# Patient Record
Sex: Female | Born: 1970 | Race: White | Hispanic: No | Marital: Married | Smoking: Never smoker
Health system: Southern US, Community
[De-identification: ages and names within clinical notes are randomized; demographics above are authoritative.]

## PROBLEM LIST (undated history)

## (undated) DIAGNOSIS — F419 Anxiety disorder, unspecified: Secondary | ICD-10-CM

## (undated) DIAGNOSIS — R51 Headache: Secondary | ICD-10-CM

## (undated) DIAGNOSIS — I10 Essential (primary) hypertension: Secondary | ICD-10-CM

## (undated) DIAGNOSIS — R519 Headache, unspecified: Secondary | ICD-10-CM

## (undated) DIAGNOSIS — J189 Pneumonia, unspecified organism: Secondary | ICD-10-CM

## (undated) DIAGNOSIS — K219 Gastro-esophageal reflux disease without esophagitis: Secondary | ICD-10-CM

## (undated) HISTORY — PX: TONSILLECTOMY: SUR1361

---

## 2000-05-06 ENCOUNTER — Other Ambulatory Visit: Admission: RE | Admit: 2000-05-06 | Discharge: 2000-05-06 | Payer: Self-pay | Admitting: Obstetrics and Gynecology

## 2000-06-05 ENCOUNTER — Encounter: Admission: RE | Admit: 2000-06-05 | Discharge: 2000-06-05 | Payer: Self-pay | Admitting: Orthopedic Surgery

## 2000-06-05 ENCOUNTER — Encounter: Payer: Self-pay | Admitting: Orthopedic Surgery

## 2001-05-27 ENCOUNTER — Other Ambulatory Visit: Admission: RE | Admit: 2001-05-27 | Discharge: 2001-05-27 | Payer: Self-pay | Admitting: Obstetrics and Gynecology

## 2002-06-16 ENCOUNTER — Other Ambulatory Visit: Admission: RE | Admit: 2002-06-16 | Discharge: 2002-06-16 | Payer: Self-pay | Admitting: Obstetrics & Gynecology

## 2003-07-23 ENCOUNTER — Other Ambulatory Visit: Admission: RE | Admit: 2003-07-23 | Discharge: 2003-07-23 | Payer: Self-pay | Admitting: Obstetrics and Gynecology

## 2004-07-06 ENCOUNTER — Ambulatory Visit: Payer: Self-pay | Admitting: Internal Medicine

## 2004-07-14 ENCOUNTER — Ambulatory Visit: Payer: Self-pay | Admitting: Internal Medicine

## 2004-08-11 ENCOUNTER — Other Ambulatory Visit: Admission: RE | Admit: 2004-08-11 | Discharge: 2004-08-11 | Payer: Self-pay | Admitting: Obstetrics and Gynecology

## 2014-10-22 ENCOUNTER — Other Ambulatory Visit: Payer: Self-pay | Admitting: Family Medicine

## 2014-10-22 ENCOUNTER — Ambulatory Visit
Admission: RE | Admit: 2014-10-22 | Discharge: 2014-10-22 | Disposition: A | Discharge status: Home / Self Care | Payer: BLUE CROSS/BLUE SHIELD | Source: Ambulatory Visit | Attending: Family Medicine | Admitting: Family Medicine

## 2014-10-22 DIAGNOSIS — R52 Pain, unspecified: Secondary | ICD-10-CM

## 2015-11-11 ENCOUNTER — Other Ambulatory Visit: Payer: Self-pay | Admitting: Family Medicine

## 2015-11-11 DIAGNOSIS — R51 Headache: Principal | ICD-10-CM

## 2015-11-11 DIAGNOSIS — R519 Headache, unspecified: Secondary | ICD-10-CM

## 2015-11-18 ENCOUNTER — Ambulatory Visit
Admission: RE | Admit: 2015-11-18 | Discharge: 2015-11-18 | Disposition: A | Discharge status: Home / Self Care | Payer: BLUE CROSS/BLUE SHIELD | Source: Ambulatory Visit | Attending: Family Medicine | Admitting: Family Medicine

## 2015-11-18 DIAGNOSIS — R51 Headache: Principal | ICD-10-CM

## 2015-11-18 DIAGNOSIS — R519 Headache, unspecified: Secondary | ICD-10-CM

## 2015-11-18 MED ORDER — GADOBENATE DIMEGLUMINE 529 MG/ML IV SOLN
13.0000 mL | Freq: Once | INTRAVENOUS | Status: AC | PRN
  Administered 2015-11-18: 13 mL via INTRAVENOUS

## 2017-08-15 ENCOUNTER — Ambulatory Visit
Admission: RE | Admit: 2017-08-15 | Discharge: 2017-08-15 | Disposition: A | Discharge status: Home / Self Care | Payer: BLUE CROSS/BLUE SHIELD | Source: Ambulatory Visit | Attending: Family Medicine | Admitting: Family Medicine

## 2017-08-15 ENCOUNTER — Other Ambulatory Visit: Payer: Self-pay | Admitting: Family Medicine

## 2017-08-15 DIAGNOSIS — R05 Cough: Secondary | ICD-10-CM

## 2017-08-15 DIAGNOSIS — R059 Cough, unspecified: Secondary | ICD-10-CM

## 2018-07-03 ENCOUNTER — Other Ambulatory Visit: Payer: Self-pay | Admitting: Family Medicine

## 2018-07-03 DIAGNOSIS — M25552 Pain in left hip: Secondary | ICD-10-CM

## 2018-07-04 ENCOUNTER — Ambulatory Visit
Admission: RE | Admit: 2018-07-04 | Discharge: 2018-07-04 | Disposition: A | Discharge status: Home / Self Care | Payer: PRIVATE HEALTH INSURANCE | Source: Ambulatory Visit | Attending: Family Medicine | Admitting: Family Medicine

## 2018-07-04 ENCOUNTER — Other Ambulatory Visit: Payer: Self-pay | Admitting: Family Medicine

## 2018-07-04 DIAGNOSIS — M25552 Pain in left hip: Secondary | ICD-10-CM

## 2018-07-08 ENCOUNTER — Other Ambulatory Visit: Payer: Self-pay | Admitting: Family Medicine

## 2018-07-08 DIAGNOSIS — M25552 Pain in left hip: Secondary | ICD-10-CM

## 2018-07-18 ENCOUNTER — Other Ambulatory Visit: Payer: Self-pay | Admitting: Family Medicine

## 2018-07-18 ENCOUNTER — Encounter: Payer: Self-pay | Admitting: Radiology

## 2018-07-18 ENCOUNTER — Ambulatory Visit
Admission: RE | Admit: 2018-07-18 | Discharge: 2018-07-18 | Disposition: A | Discharge status: Home / Self Care | Payer: PRIVATE HEALTH INSURANCE | Source: Ambulatory Visit | Attending: Family Medicine | Admitting: Family Medicine

## 2018-07-18 DIAGNOSIS — M25552 Pain in left hip: Secondary | ICD-10-CM

## 2018-07-18 MED ORDER — IOPAMIDOL (ISOVUE-M 200) INJECTION 41%
1.0000 mL | Freq: Once | INTRAMUSCULAR | Status: AC
  Administered 2018-07-18: 1 mL

## 2018-07-18 MED ORDER — METHYLPREDNISOLONE ACETATE 40 MG/ML INJ SUSP (RADIOLOG
120.0000 mg | Freq: Once | INTRAMUSCULAR | Status: AC
  Administered 2018-07-18: 120 mg via INTRA_ARTICULAR

## 2018-08-16 ENCOUNTER — Encounter: Payer: Self-pay | Admitting: Physical Medicine and Rehabilitation

## 2018-08-20 ENCOUNTER — Other Ambulatory Visit: Payer: Self-pay | Admitting: Orthopedic Surgery

## 2018-08-21 ENCOUNTER — Encounter (INDEPENDENT_AMBULATORY_CARE_PROVIDER_SITE_OTHER): Payer: Self-pay

## 2018-08-21 ENCOUNTER — Encounter (HOSPITAL_COMMUNITY)
Admission: RE | Admit: 2018-08-21 | Discharge: 2018-08-21 | Disposition: A | Discharge status: Home / Self Care | Payer: PRIVATE HEALTH INSURANCE | Source: Ambulatory Visit | Attending: Orthopedic Surgery | Admitting: Orthopedic Surgery

## 2018-08-21 ENCOUNTER — Encounter (HOSPITAL_COMMUNITY): Payer: Self-pay

## 2018-08-21 ENCOUNTER — Ambulatory Visit (HOSPITAL_COMMUNITY)
Admission: RE | Admit: 2018-08-21 | Discharge: 2018-08-21 | Disposition: A | Discharge status: Home / Self Care | Payer: PRIVATE HEALTH INSURANCE | Source: Ambulatory Visit | Attending: Orthopedic Surgery | Admitting: Orthopedic Surgery

## 2018-08-21 ENCOUNTER — Other Ambulatory Visit: Payer: Self-pay

## 2018-08-21 DIAGNOSIS — Z01811 Encounter for preprocedural respiratory examination: Secondary | ICD-10-CM | POA: Insufficient documentation

## 2018-08-21 HISTORY — DX: Headache: R51

## 2018-08-21 HISTORY — DX: Anxiety disorder, unspecified: F41.9

## 2018-08-21 HISTORY — DX: Headache, unspecified: R51.9

## 2018-08-21 HISTORY — DX: Pneumonia, unspecified organism: J18.9

## 2018-08-21 HISTORY — DX: Gastro-esophageal reflux disease without esophagitis: K21.9

## 2018-08-21 LAB — CBC WITH DIFFERENTIAL/PLATELET
Abs Immature Granulocytes: 0.05 10*3/uL (ref 0.00–0.07)
BASOS PCT: 0 %
Basophils Absolute: 0 10*3/uL (ref 0.0–0.1)
Eosinophils Absolute: 0.1 10*3/uL (ref 0.0–0.5)
Eosinophils Relative: 1 %
HCT: 39.7 % (ref 36.0–46.0)
Hemoglobin: 12.4 g/dL (ref 12.0–15.0)
Immature Granulocytes: 1 %
Lymphocytes Relative: 25 %
Lymphs Abs: 2.1 10*3/uL (ref 0.7–4.0)
MCH: 32 pg (ref 26.0–34.0)
MCHC: 31.2 g/dL (ref 30.0–36.0)
MCV: 102.6 fL — ABNORMAL HIGH (ref 80.0–100.0)
MONO ABS: 0.7 10*3/uL (ref 0.1–1.0)
Monocytes Relative: 9 %
Neutro Abs: 5.6 10*3/uL (ref 1.7–7.7)
Neutrophils Relative %: 64 %
Platelets: 233 10*3/uL (ref 150–400)
RBC: 3.87 MIL/uL (ref 3.87–5.11)
RDW: 12.4 % (ref 11.5–15.5)
WBC: 8.5 10*3/uL (ref 4.0–10.5)
nRBC: 0 % (ref 0.0–0.2)

## 2018-08-21 LAB — URINALYSIS, ROUTINE W REFLEX MICROSCOPIC
Bilirubin Urine: NEGATIVE
Glucose, UA: NEGATIVE mg/dL
Hgb urine dipstick: NEGATIVE
Ketones, ur: NEGATIVE mg/dL
Leukocytes,Ua: NEGATIVE
NITRITE: NEGATIVE
Protein, ur: NEGATIVE mg/dL
Specific Gravity, Urine: 1.019 (ref 1.005–1.030)
pH: 5 (ref 5.0–8.0)

## 2018-08-21 LAB — PROTIME-INR
INR: 1 (ref 0.8–1.2)
Prothrombin Time: 12.9 seconds (ref 11.4–15.2)

## 2018-08-21 LAB — COMPREHENSIVE METABOLIC PANEL
ALT: 26 U/L (ref 0–44)
ANION GAP: 9 (ref 5–15)
AST: 24 U/L (ref 15–41)
Albumin: 4.4 g/dL (ref 3.5–5.0)
Alkaline Phosphatase: 124 U/L (ref 38–126)
BILIRUBIN TOTAL: 0.6 mg/dL (ref 0.3–1.2)
BUN: 18 mg/dL (ref 6–20)
CO2: 24 mmol/L (ref 22–32)
Calcium: 9.2 mg/dL (ref 8.9–10.3)
Chloride: 105 mmol/L (ref 98–111)
Creatinine, Ser: 0.72 mg/dL (ref 0.44–1.00)
GFR calc Af Amer: >60 mL/min (ref >60)
GFR calc non Af Amer: >60 mL/min (ref >60)
Glucose, Bld: 92 mg/dL (ref 70–99)
Potassium: 3.9 mmol/L (ref 3.5–5.1)
Sodium: 138 mmol/L (ref 135–145)
Total Protein: 7.5 g/dL (ref 6.5–8.1)

## 2018-08-21 LAB — APTT: aPTT: 27 seconds (ref 24–36)

## 2018-08-21 LAB — SURGICAL PCR SCREEN
MRSA, PCR: NEGATIVE
STAPHYLOCOCCUS AUREUS: NEGATIVE

## 2018-08-21 NOTE — Patient Instructions (Signed)
Patricia Mclean  08/21/2018   Your procedure is scheduled on: 08/22/2018   Report to [facility] Main  Entrance  Report to admitting at   1045 AM    Call this number if you have problems the morning of surgery [phone]   Remember: Do not eat food  :After Midnight. BRUSH YOUR TEETH MORNING OF SURGERY AND RINSE YOUR MOUTH OUT, NO CHEWING GUM CANDY OR MINTS.  May have clear liquids from until 0700am morning of surgery then nothing by mouth.     CLEAR LIQUID DIET   Foods Allowed                                                                     Foods Excluded  Coffee and tea, regular and decaf                             liquids that you cannot  Plain Jell-O in any flavor                                             see through such as: Fruit ices (not with fruit pulp)                                     milk, soups, orange juice  Iced Popsicles                                    All solid food Carbonated beverages, regular and diet                                    Cranberry, grape and apple juices Sports drinks like Gatorade Lightly seasoned clear broth or consume(fat free) Sugar, honey syrup  Sample Menu Breakfast                                Lunch                                     Supper Cranberry juice                    Beef broth                            Chicken broth Jell-O                                     Grape juice  Apple juice Coffee or tea                        Jell-O                                      Popsicle                                                Coffee or tea                        Coffee or tea  _____________________________________________________________________     Take these medicines the morning of surgery with A SIP OF WATER: none                                 You may not have any metal on your body including hair pins and              piercings  Do not wear  jewelry, make-up, lotions, powders or perfumes, deodorant             Do not wear nail polish.  Do not shave  48 hours prior to surgery.              Do not bring valuables to the hospital. Patricia Mclean IS NOT             RESPONSIBLE   FOR VALUABLES.  Contacts, dentures or bridgework may not be worn into surgery.  Leave suitcase in the car. After surgery it may be brought to your room.                      Please read over the following fact sheets you were given: _____________________________________________________________________             [facility] - Preparing for Surgery Before surgery, you can play an important role.  Because skin is not sterile, your skin needs to be as free of germs as possible.  You can reduce the number of germs on your skin by washing with CHG (chlorahexidine gluconate) soap before surgery.  CHG is an antiseptic cleaner which kills germs and bonds with the skin to continue killing germs even after washing. Please DO NOT use if you have an allergy to CHG or antibacterial soaps.  If your skin becomes reddened/irritated stop using the CHG and inform your nurse when you arrive at Short Stay. Do not shave (including legs and underarms) for at least 48 hours prior to the first CHG shower.  You may shave your face/neck. Please follow these instructions carefully:  1.  Shower with CHG Soap the night before surgery and the  morning of Surgery.  2.  If you choose to wash your hair, wash your hair first as usual with your  normal  shampoo.  3.  After you shampoo, rinse your hair and body thoroughly to remove the  shampoo.                           4.  Use CHG as you would any other liquid soap.  You can apply chg directly  to the skin and wash                       Gently with a scrungie or clean washcloth.  5.  Apply the CHG Soap to your body ONLY FROM THE NECK DOWN.   Do not use on face/ open                           Wound or open sores. Avoid contact with eyes,  ears mouth and genitals (private parts).                       Wash face,  Genitals (private parts) with your normal soap.             6.  Wash thoroughly, paying special attention to the area where your surgery  will be performed.  7.  Thoroughly rinse your body with warm water from the neck down.  8.  DO NOT shower/wash with your normal soap after using and rinsing off  the CHG Soap.                9.  Pat yourself dry with a clean towel.            10.  Wear clean pajamas.            11.  Place clean sheets on your bed the night of your first shower and do not  sleep with pets. Day of Surgery : Do not apply any lotions/deodorants the morning of surgery.  Please wear clean clothes to the hospital/surgery center.  FAILURE TO FOLLOW THESE INSTRUCTIONS MAY RESULT IN THE CANCELLATION OF YOUR SURGERY PATIENT SIGNATURE_________________________________  NURSE SIGNATURE__________________________________  ________________________________________________________________________   Patricia Mclean  An incentive spirometer is a tool that can help keep your lungs clear and active. This tool measures how well you are filling your lungs with each breath. Taking long deep breaths may help reverse or decrease the chance of developing breathing (pulmonary) problems (especially infection) following:  A long period of time when you are unable to move or be active. BEFORE THE PROCEDURE   If the spirometer includes an indicator to show your best effort, your nurse or respiratory therapist will set it to a desired goal.  If possible, sit up straight or lean slightly forward. Try not to slouch.  Hold the incentive spirometer in an upright position. INSTRUCTIONS FOR USE  1. Sit on the edge of your bed if possible, or sit up as far as you can in bed or on a chair. 2. Hold the incentive spirometer in an upright position. 3. Breathe out normally. 4. Place the mouthpiece in your mouth and seal your lips  tightly around it. 5. Breathe in slowly and as deeply as possible, raising the piston or the ball toward the top of the column. 6. Hold your breath for 3-5 seconds or for as long as possible. Allow the piston or ball to fall to the bottom of the column. 7. Remove the mouthpiece from your mouth and breathe out normally. 8. Rest for a few seconds and repeat Steps 1 through 7 at least 10 times every 1-2 hours when you are awake. Take your time and take a few normal breaths between deep breaths. 9. The spirometer may include an indicator to show your best effort. Use the indicator as a goal to work  toward during each repetition. 10. After each set of 10 deep breaths, practice coughing to be sure your lungs are clear. If you have an incision (the cut made at the time of surgery), support your incision when coughing by placing a pillow or rolled up towels firmly against it. Once you are able to get out of bed, walk around indoors and cough well. You may stop using the incentive spirometer when instructed by your caregiver.  RISKS AND COMPLICATIONS  Take your time so you do not get dizzy or light-headed.  If you are in pain, you may need to take or ask for pain medication before doing incentive spirometry. It is harder to take a deep breath if you are having pain. AFTER USE  Rest and breathe slowly and easily.  It can be helpful to keep track of a log of your progress. Your caregiver can provide you with a simple table to help with this. If you are using the spirometer at home, follow these instructions: [place] IF:   You are having difficultly using the spirometer.  You have trouble using the spirometer as often as instructed.  Your pain medication is not giving enough relief while using the spirometer.  You develop fever of 100.5 F (38.1 C) or higher. SEEK IMMEDIATE MEDICAL CARE IF:   You cough up bloody sputum that had not been present before.  You develop fever of 102 F  (38.9 C) or greater.  You develop worsening pain at or near the incision site. MAKE SURE YOU:   Understand these instructions.  Will watch your condition.  Will get help right away if you are not doing well or get worse. Document Released: 10/08/2006 Document Revised: 08/20/2011 Document Reviewed: 12/09/2006 [facility] Patient Information 2014 [place], [place].   ________________________________________________________________________

## 2018-08-22 ENCOUNTER — Telehealth (HOSPITAL_COMMUNITY): Payer: Self-pay | Admitting: *Deleted

## 2018-08-22 ENCOUNTER — Encounter (HOSPITAL_COMMUNITY): Payer: Self-pay | Admitting: *Deleted

## 2018-08-22 ENCOUNTER — Ambulatory Visit (HOSPITAL_COMMUNITY): Payer: No Typology Code available for payment source | Admitting: Anesthesiology

## 2018-08-22 ENCOUNTER — Ambulatory Visit (HOSPITAL_COMMUNITY): Payer: No Typology Code available for payment source

## 2018-08-22 ENCOUNTER — Observation Stay (HOSPITAL_COMMUNITY)
Admission: RE | Admit: 2018-08-22 | Discharge: 2018-08-24 | Disposition: A | Discharge status: Home Health | Payer: No Typology Code available for payment source | Attending: Orthopedic Surgery | Admitting: Orthopedic Surgery

## 2018-08-22 ENCOUNTER — Encounter (HOSPITAL_COMMUNITY)
Admission: RE | Disposition: A | Discharge status: Home Health | Payer: Self-pay | Source: Home / Self Care | Attending: Orthopedic Surgery

## 2018-08-22 DIAGNOSIS — M1612 Unilateral primary osteoarthritis, left hip: Secondary | ICD-10-CM | POA: Insufficient documentation

## 2018-08-22 DIAGNOSIS — F419 Anxiety disorder, unspecified: Secondary | ICD-10-CM | POA: Insufficient documentation

## 2018-08-22 DIAGNOSIS — Z7982 Long term (current) use of aspirin: Secondary | ICD-10-CM | POA: Diagnosis not present

## 2018-08-22 DIAGNOSIS — Z419 Encounter for procedure for purposes other than remedying health state, unspecified: Secondary | ICD-10-CM

## 2018-08-22 DIAGNOSIS — M879 Osteonecrosis, unspecified: Secondary | ICD-10-CM | POA: Diagnosis present

## 2018-08-22 DIAGNOSIS — M87052 Idiopathic aseptic necrosis of left femur: Secondary | ICD-10-CM | POA: Diagnosis present

## 2018-08-22 DIAGNOSIS — Z79899 Other long term (current) drug therapy: Secondary | ICD-10-CM | POA: Insufficient documentation

## 2018-08-22 DIAGNOSIS — K219 Gastro-esophageal reflux disease without esophagitis: Secondary | ICD-10-CM | POA: Diagnosis not present

## 2018-08-22 DIAGNOSIS — R51 Headache: Secondary | ICD-10-CM | POA: Diagnosis not present

## 2018-08-22 HISTORY — PX: TOTAL HIP ARTHROPLASTY: SHX124

## 2018-08-22 LAB — PREGNANCY, URINE: Preg Test, Ur: NEGATIVE

## 2018-08-22 LAB — ABO/RH: ABO/RH(D): A POS

## 2018-08-22 LAB — TYPE AND SCREEN
ABO/RH(D): A POS
Antibody Screen: NEGATIVE

## 2018-08-22 SURGERY — ARTHROPLASTY, HIP, TOTAL, ANTERIOR APPROACH
Anesthesia: Spinal | Site: Hip | Laterality: Left

## 2018-08-22 MED ORDER — PHENYLEPHRINE 40 MCG/ML (10ML) SYRINGE FOR IV PUSH (FOR BLOOD PRESSURE SUPPORT)
PREFILLED_SYRINGE | INTRAVENOUS | Status: DC | PRN
  Administered 2018-08-22: 40 ug via INTRAVENOUS
  Administered 2018-08-22: 80 ug via INTRAVENOUS
  Administered 2018-08-22: 40 ug via INTRAVENOUS
  Administered 2018-08-22 (×3): 80 ug via INTRAVENOUS

## 2018-08-22 MED ORDER — FENTANYL CITRATE (PF) 100 MCG/2ML IJ SOLN: 25.0000 ug | INTRAMUSCULAR | Status: DC | PRN

## 2018-08-22 MED ORDER — BISACODYL 5 MG PO TBEC: 5.0000 mg | DELAYED_RELEASE_TABLET | Freq: Every day | ORAL | Status: DC | PRN

## 2018-08-22 MED ORDER — ONDANSETRON HCL 4 MG/2ML IJ SOLN
INTRAMUSCULAR | Status: DC | PRN
  Administered 2018-08-22: 4 mg via INTRAVENOUS

## 2018-08-22 MED ORDER — ONDANSETRON HCL 4 MG/2ML IJ SOLN: 4.0000 mg | Freq: Four times a day (QID) | INTRAMUSCULAR | Status: DC | PRN

## 2018-08-22 MED ORDER — LACTATED RINGERS IV SOLN
INTRAVENOUS | Status: DC
  Administered 2018-08-22 (×2): via INTRAVENOUS

## 2018-08-22 MED ORDER — BUPIVACAINE LIPOSOME 1.3 % IJ SUSP
10.0000 mL | Freq: Once | INTRAMUSCULAR | Status: DC
  Filled 2018-08-22: qty 10

## 2018-08-22 MED ORDER — TIZANIDINE HCL 2 MG PO TABS: 2.0000 mg | ORAL_TABLET | Freq: Three times a day (TID) | ORAL | 0 refills | Status: DC | PRN

## 2018-08-22 MED ORDER — BUPIVACAINE LIPOSOME 1.3 % IJ SUSP
INTRAMUSCULAR | Status: DC | PRN
  Administered 2018-08-22: 20 mL

## 2018-08-22 MED ORDER — METOPROLOL TARTRATE 25 MG PO TABS
25.0000 mg | ORAL_TABLET | Freq: Two times a day (BID) | ORAL | Status: DC
  Administered 2018-08-22 – 2018-08-24 (×3): 25 mg via ORAL
  Filled 2018-08-22 (×4): qty 1

## 2018-08-22 MED ORDER — PROPOFOL 10 MG/ML IV BOLUS
INTRAVENOUS | Status: AC
  Filled 2018-08-22: qty 20

## 2018-08-22 MED ORDER — POLYETHYLENE GLYCOL 3350 17 G PO PACK: 17.0000 g | PACK | Freq: Every day | ORAL | Status: DC | PRN

## 2018-08-22 MED ORDER — WATER FOR IRRIGATION, STERILE IR SOLN
Status: DC | PRN
  Administered 2018-08-22: 1000 mL

## 2018-08-22 MED ORDER — SODIUM CHLORIDE 0.9 % IV SOLN
INTRAVENOUS | Status: DC
  Administered 2018-08-22: 20:00:00 via INTRAVENOUS

## 2018-08-22 MED ORDER — VANCOMYCIN HCL IN DEXTROSE 1-5 GM/200ML-% IV SOLN
1000.0000 mg | Freq: Once | INTRAVENOUS | Status: AC
  Administered 2018-08-22: 1000 mg via INTRAVENOUS
  Filled 2018-08-22: qty 200

## 2018-08-22 MED ORDER — ONDANSETRON HCL 4 MG/2ML IJ SOLN
INTRAMUSCULAR | Status: AC
  Filled 2018-08-22: qty 2

## 2018-08-22 MED ORDER — SODIUM CHLORIDE 0.9% FLUSH
INTRAVENOUS | Status: DC | PRN
  Administered 2018-08-22: 20 mL

## 2018-08-22 MED ORDER — OXYCODONE HCL 5 MG PO TABS
5.0000 mg | ORAL_TABLET | ORAL | Status: DC | PRN
  Administered 2018-08-22: 5 mg via ORAL
  Administered 2018-08-22 – 2018-08-23 (×6): 10 mg via ORAL
  Administered 2018-08-24: 5 mg via ORAL
  Administered 2018-08-24: 10 mg via ORAL
  Filled 2018-08-22 (×3): qty 2
  Filled 2018-08-22 (×2): qty 1
  Filled 2018-08-22 (×2): qty 2
  Filled 2018-08-22: qty 1
  Filled 2018-08-22 (×2): qty 2

## 2018-08-22 MED ORDER — VANCOMYCIN HCL 1000 MG IV SOLR
1000.0000 mg | Freq: Two times a day (BID) | INTRAVENOUS | Status: AC
  Administered 2018-08-22: 1000 mg via INTRAVENOUS
  Filled 2018-08-22: qty 1000

## 2018-08-22 MED ORDER — CEFAZOLIN SODIUM-DEXTROSE 2-4 GM/100ML-% IV SOLN: 2.0000 g | INTRAVENOUS | Status: DC

## 2018-08-22 MED ORDER — EPHEDRINE SULFATE-NACL 50-0.9 MG/10ML-% IV SOSY
PREFILLED_SYRINGE | INTRAVENOUS | Status: DC | PRN
  Administered 2018-08-22: 10 mg via INTRAVENOUS
  Administered 2018-08-22: 5 mg via INTRAVENOUS

## 2018-08-22 MED ORDER — METHOCARBAMOL 500 MG PO TABS
500.0000 mg | ORAL_TABLET | Freq: Four times a day (QID) | ORAL | Status: DC | PRN
  Administered 2018-08-22 – 2018-08-24 (×6): 500 mg via ORAL
  Filled 2018-08-22 (×6): qty 1

## 2018-08-22 MED ORDER — OXYCODONE HCL 5 MG/5ML PO SOLN: 5.0000 mg | Freq: Once | ORAL | Status: DC | PRN

## 2018-08-22 MED ORDER — TOPIRAMATE 25 MG PO TABS
50.0000 mg | ORAL_TABLET | Freq: Two times a day (BID) | ORAL | Status: DC
  Administered 2018-08-22 – 2018-08-24 (×4): 50 mg via ORAL
  Filled 2018-08-22 (×4): qty 2

## 2018-08-22 MED ORDER — TRANEXAMIC ACID-NACL 1000-0.7 MG/100ML-% IV SOLN
1000.0000 mg | Freq: Once | INTRAVENOUS | Status: AC
  Administered 2018-08-22: 1000 mg via INTRAVENOUS
  Filled 2018-08-22: qty 100

## 2018-08-22 MED ORDER — FENTANYL CITRATE (PF) 100 MCG/2ML IJ SOLN
INTRAMUSCULAR | Status: DC | PRN
  Administered 2018-08-22: 100 ug via INTRAVENOUS

## 2018-08-22 MED ORDER — OXYCODONE HCL 5 MG PO TABS: 5.0000 mg | ORAL_TABLET | Freq: Once | ORAL | Status: DC | PRN

## 2018-08-22 MED ORDER — OXYCODONE-ACETAMINOPHEN 5-325 MG PO TABS: 1.0000 | ORAL_TABLET | ORAL | 0 refills | Status: DC | PRN

## 2018-08-22 MED ORDER — ASPIRIN EC 325 MG PO TBEC
325.0000 mg | DELAYED_RELEASE_TABLET | Freq: Two times a day (BID) | ORAL | Status: DC
  Administered 2018-08-22 – 2018-08-24 (×4): 325 mg via ORAL
  Filled 2018-08-22 (×4): qty 1

## 2018-08-22 MED ORDER — SODIUM CHLORIDE 0.9 % IR SOLN
Status: DC | PRN
  Administered 2018-08-22: 1

## 2018-08-22 MED ORDER — TRANEXAMIC ACID-NACL 1000-0.7 MG/100ML-% IV SOLN
1000.0000 mg | INTRAVENOUS | Status: AC
  Administered 2018-08-22: 1000 mg via INTRAVENOUS
  Filled 2018-08-22: qty 100

## 2018-08-22 MED ORDER — PROPOFOL 10 MG/ML IV BOLUS
INTRAVENOUS | Status: DC | PRN
  Administered 2018-08-22: 40 mg via INTRAVENOUS
  Administered 2018-08-22: 20 mg via INTRAVENOUS
  Administered 2018-08-22: 50 mg via INTRAVENOUS
  Administered 2018-08-22 (×2): 20 mg via INTRAVENOUS

## 2018-08-22 MED ORDER — DEXAMETHASONE SODIUM PHOSPHATE 10 MG/ML IJ SOLN
10.0000 mg | Freq: Two times a day (BID) | INTRAMUSCULAR | Status: AC
  Administered 2018-08-22 – 2018-08-23 (×3): 10 mg via INTRAVENOUS
  Filled 2018-08-22 (×3): qty 1

## 2018-08-22 MED ORDER — PHENYLEPHRINE 40 MCG/ML (10ML) SYRINGE FOR IV PUSH (FOR BLOOD PRESSURE SUPPORT)
PREFILLED_SYRINGE | INTRAVENOUS | Status: AC
  Filled 2018-08-22: qty 10

## 2018-08-22 MED ORDER — FENTANYL CITRATE (PF) 100 MCG/2ML IJ SOLN
INTRAMUSCULAR | Status: AC
  Filled 2018-08-22: qty 2

## 2018-08-22 MED ORDER — DOCUSATE SODIUM 100 MG PO CAPS
100.0000 mg | ORAL_CAPSULE | Freq: Two times a day (BID) | ORAL | Status: DC
  Administered 2018-08-22 – 2018-08-24 (×4): 100 mg via ORAL
  Filled 2018-08-22 (×4): qty 1

## 2018-08-22 MED ORDER — SODIUM CHLORIDE (PF) 0.9 % IJ SOLN
INTRAMUSCULAR | Status: AC
  Filled 2018-08-22: qty 20

## 2018-08-22 MED ORDER — BUPIVACAINE-EPINEPHRINE (PF) 0.25% -1:200000 IJ SOLN
INTRAMUSCULAR | Status: AC
  Filled 2018-08-22: qty 30

## 2018-08-22 MED ORDER — BUPIVACAINE IN DEXTROSE 0.75-8.25 % IT SOLN
INTRATHECAL | Status: DC | PRN
  Administered 2018-08-22: 1.6 mL via INTRATHECAL

## 2018-08-22 MED ORDER — MAGNESIUM CITRATE PO SOLN: 1.0000 | Freq: Once | ORAL | Status: DC | PRN

## 2018-08-22 MED ORDER — ACETAMINOPHEN 325 MG PO TABS
325.0000 mg | ORAL_TABLET | Freq: Four times a day (QID) | ORAL | Status: DC | PRN
  Administered 2018-08-23 (×2): 650 mg via ORAL
  Filled 2018-08-22 (×2): qty 2

## 2018-08-22 MED ORDER — HYDROMORPHONE HCL 1 MG/ML IJ SOLN
0.5000 mg | INTRAMUSCULAR | Status: DC | PRN
  Administered 2018-08-22: 1 mg via INTRAVENOUS
  Filled 2018-08-22: qty 1

## 2018-08-22 MED ORDER — PANTOPRAZOLE SODIUM 40 MG PO TBEC
40.0000 mg | DELAYED_RELEASE_TABLET | Freq: Every day | ORAL | Status: DC
  Administered 2018-08-23 – 2018-08-24 (×2): 40 mg via ORAL
  Filled 2018-08-22 (×2): qty 1

## 2018-08-22 MED ORDER — DOCUSATE SODIUM 100 MG PO CAPS: 100.0000 mg | ORAL_CAPSULE | Freq: Two times a day (BID) | ORAL | 0 refills | Status: DC

## 2018-08-22 MED ORDER — EPHEDRINE 5 MG/ML INJ
INTRAVENOUS | Status: AC
  Filled 2018-08-22: qty 10

## 2018-08-22 MED ORDER — ONDANSETRON HCL 4 MG PO TABS: 4.0000 mg | ORAL_TABLET | Freq: Four times a day (QID) | ORAL | Status: DC | PRN

## 2018-08-22 MED ORDER — CHLORHEXIDINE GLUCONATE 4 % EX LIQD: 60.0000 mL | Freq: Once | CUTANEOUS | Status: DC

## 2018-08-22 MED ORDER — MIDAZOLAM HCL 5 MG/5ML IJ SOLN
INTRAMUSCULAR | Status: DC | PRN
  Administered 2018-08-22: 2 mg via INTRAVENOUS

## 2018-08-22 MED ORDER — ALPRAZOLAM 0.25 MG PO TABS: 0.2500 mg | ORAL_TABLET | Freq: Every evening | ORAL | Status: DC | PRN

## 2018-08-22 MED ORDER — METHOCARBAMOL 500 MG IVPB - SIMPLE MED
500.0000 mg | Freq: Four times a day (QID) | INTRAVENOUS | Status: DC | PRN
  Filled 2018-08-22: qty 50

## 2018-08-22 MED ORDER — ASPIRIN EC 325 MG PO TBEC: 325.0000 mg | DELAYED_RELEASE_TABLET | Freq: Two times a day (BID) | ORAL | 0 refills | Status: DC

## 2018-08-22 MED ORDER — MIDAZOLAM HCL 2 MG/2ML IJ SOLN
INTRAMUSCULAR | Status: AC
  Filled 2018-08-22: qty 2

## 2018-08-22 MED ORDER — BUPIVACAINE-EPINEPHRINE 0.25% -1:200000 IJ SOLN
INTRAMUSCULAR | Status: DC | PRN
  Administered 2018-08-22: 30 mL

## 2018-08-22 MED ORDER — PAROXETINE HCL 20 MG PO TABS
20.0000 mg | ORAL_TABLET | Freq: Every day | ORAL | Status: DC
  Administered 2018-08-23 – 2018-08-24 (×2): 20 mg via ORAL
  Filled 2018-08-22 (×2): qty 1

## 2018-08-22 MED ORDER — OXYCODONE-ACETAMINOPHEN 5-325 MG PO TABS: 1.0000 | ORAL_TABLET | Freq: Four times a day (QID) | ORAL | 0 refills | Status: DC | PRN

## 2018-08-22 MED ORDER — ALUM & MAG HYDROXIDE-SIMETH 200-200-20 MG/5ML PO SUSP: 30.0000 mL | ORAL | Status: DC | PRN

## 2018-08-22 MED ORDER — DIPHENHYDRAMINE HCL 12.5 MG/5ML PO ELIX: 12.5000 mg | ORAL_SOLUTION | ORAL | Status: DC | PRN

## 2018-08-22 MED ORDER — GABAPENTIN 300 MG PO CAPS
300.0000 mg | ORAL_CAPSULE | Freq: Two times a day (BID) | ORAL | Status: DC
  Administered 2018-08-22 – 2018-08-24 (×4): 300 mg via ORAL
  Filled 2018-08-22 (×4): qty 1

## 2018-08-22 MED ORDER — PROPOFOL 500 MG/50ML IV EMUL
INTRAVENOUS | Status: DC | PRN
  Administered 2018-08-22: 75 ug/kg/min via INTRAVENOUS

## 2018-08-22 SURGICAL SUPPLY — 41 items
BAG ZIPLOCK 12X15 (MISCELLANEOUS) IMPLANT
BENZOIN TINCTURE PRP APPL 2/3 (GAUZE/BANDAGES/DRESSINGS) ×6 IMPLANT
BLADE SAW SGTL 18X1.27X75 (BLADE) ×2 IMPLANT
BLADE SAW SGTL 18X1.27X75MM (BLADE) ×1
BLADE SURG SZ10 CARB STEEL (BLADE) ×6 IMPLANT
CHLORAPREP W/TINT 26 (MISCELLANEOUS) ×3 IMPLANT
CLOSURE WOUND 1/2 X4 (GAUZE/BANDAGES/DRESSINGS) ×2
COVER PERINEAL POST (MISCELLANEOUS) ×3 IMPLANT
COVER SURGICAL LIGHT HANDLE (MISCELLANEOUS) ×3 IMPLANT
COVER WAND RF STERILE (DRAPES) IMPLANT
CUP ACETBLR 52 OD 100 SERIES (Hips) ×3 IMPLANT
DRAPE STERI IOBAN 125X83 (DRAPES) ×3 IMPLANT
DRAPE U-SHAPE 47X51 STRL (DRAPES) ×6 IMPLANT
DRSG AQUACEL AG ADV 3.5X 6 (GAUZE/BANDAGES/DRESSINGS) ×3 IMPLANT
ELECT BLADE TIP CTD 4 INCH (ELECTRODE) ×3 IMPLANT
ELECT REM PT RETURN 15FT ADLT (MISCELLANEOUS) ×3 IMPLANT
ELIMINATOR HOLE APEX DEPUY (Hips) ×3 IMPLANT
GAUZE XEROFORM 1X8 LF (GAUZE/BANDAGES/DRESSINGS) IMPLANT
GLOVE BIOGEL PI IND STRL 8 (GLOVE) ×2 IMPLANT
GLOVE BIOGEL PI INDICATOR 8 (GLOVE) ×4
GLOVE ECLIPSE 7.5 STRL STRAW (GLOVE) ×6 IMPLANT
GOWN STRL REUS W/TWL XL LVL3 (GOWN DISPOSABLE) ×6 IMPLANT
HEAD CERAMIC DELTA 36 PLUS 1.5 (Hips) ×3 IMPLANT
HOLDER FOLEY CATH W/STRAP (MISCELLANEOUS) ×3 IMPLANT
HOOD PEEL AWAY FLYTE STAYCOOL (MISCELLANEOUS) ×6 IMPLANT
KIT TURNOVER KIT A (KITS) IMPLANT
LINER ACETAB NEUTRAL 36ID 520D (Liner) ×3 IMPLANT
NEEDLE HYPO 22GX1.5 SAFETY (NEEDLE) ×3 IMPLANT
PACK ANTERIOR HIP CUSTOM (KITS) ×3 IMPLANT
STAPLER VISISTAT 35W (STAPLE) IMPLANT
STEM CORAIL KA10 (Stem) ×3 IMPLANT
STRIP CLOSURE SKIN 1/2X4 (GAUZE/BANDAGES/DRESSINGS) ×4 IMPLANT
SUT ETHIBOND NAB CT1 #1 30IN (SUTURE) ×6 IMPLANT
SUT MNCRL AB 3-0 PS2 18 (SUTURE) IMPLANT
SUT VIC AB 0 CT1 36 (SUTURE) ×3 IMPLANT
SUT VIC AB 1 CT1 36 (SUTURE) ×3 IMPLANT
SUT VIC AB 2-0 CT1 27 (SUTURE) ×2
SUT VIC AB 2-0 CT1 TAPERPNT 27 (SUTURE) ×1 IMPLANT
TRAY FOLEY MTR SLVR 16FR STAT (SET/KITS/TRAYS/PACK) ×3 IMPLANT
YANKAUER SUCT BULB TIP 10FT TU (MISCELLANEOUS) ×3 IMPLANT
YANKAUER SUCT BULB TIP NO VENT (SUCTIONS) ×3 IMPLANT

## 2018-08-22 NOTE — H&P (Signed)
TOTAL HIP ADMISSION H&P  Patient is admitted for left total hip arthroplasty.  Subjective:  Chief Complaint: left hip pain  HPI: Patricia Mclean, 48 y.o. female, has a history of pain and functional disability in the left hip(s) due to arthritis and patient has failed non-surgical conservative treatments for greater than 12 weeks to include NSAID's and/or analgesics and activity modification.  Onset of symptoms was abrupt starting 1 years ago with rapidlly worsening course since that time.The patient noted no past surgery on the left hip(s).  Patient currently rates pain in the left hip at 10 out of 10 with activity. Patient has night pain, worsening of pain with activity and weight bearing, trendelenberg gait, pain that interfers with activities of daily living, pain with passive range of motion, crepitus and joint swelling. Patient has evidence of joint space narrowing and Avascular necrosis by imaging studies. This condition presents safety issues increasing the risk of falls. This patient has had avascular necrosis of the hip, acetabular fracture, hip dysplasia.  There is no current active infection.  There are no active problems to display for this patient.  Past Medical History:  Diagnosis Date  . Anxiety   . GERD (gastroesophageal reflux disease)   . Headache    metoprolol and topamx  . Pneumonia    x3 had vaccine      Current Facility-Administered Medications  Medication Dose Route Frequency Provider Last Rate Last Dose  . bupivacaine liposome (EXPAREL) 1.3 % injection 133 mg  10 mL Infiltration Once Jodi Geralds, MD      . ceFAZolin (ANCEF) IVPB 2g/100 mL premix  2 g Intravenous On Call to OR Jodi Geralds, MD      . chlorhexidine (HIBICLENS) 4 % liquid 4 application  60 mL Topical Once Jodi Geralds, MD      . tranexamic acid (CYKLOKAPRON) IVPB 1,000 mg  1,000 mg Intravenous To OR Jodi Geralds, MD       Allergies  Allergen Reactions  . Penicillins Anaphylaxis and Shortness  Of Breath    Did it involve swelling of the face/tongue/throat, SOB, or low BP? Yes Did it involve sudden or severe rash/hives, skin peeling, or any reaction on the inside of your mouth or nose? No Did you need to seek medical attention at a hospital or doctor's office? Yes When did it last happen?30+ years If all above answers are "NO", may proceed with cephalosporin use.   . Avelox [Moxifloxacin Hcl] Hives  . Levofloxacin Hives    Social History   Tobacco Use  . Smoking status: Never Smoker  . Smokeless tobacco: Never Used  Substance Use Topics  . Alcohol use: Yes    Alcohol/week: 1.0 standard drinks    Types: 1 Glasses of wine per week    Comment: occasional  wine with dinner    No family history on file.   Review of Systems  Neurological: Focal weakness: yyy.   ROS: I have reviewed the patient's review of systems thoroughly and there are no positive responses as relates to the HPI. Objective:  Physical Exam  Vital signs in last 24 hours: Temp:  [98 F (36.7 C)-98.3 F (36.8 C)] 98.3 F (36.8 C) (03/13 1017) Pulse Rate:  [76-79] 79 (03/13 1017) Resp:  [16] 16 (03/13 1017) BP: (121-130)/(70-100) 121/100 (03/13 1017) SpO2:  [99 %-100 %] 99 % (03/13 1017) Weight:  [74.8 kg] 74.8 kg (03/12 1437) Well-developed well-nourished patient in no acute distress. Alert and oriented x3 HEENT:within normal limits Cardiac: Regular  rate and rhythm Pulmonary: Lungs clear to auscultation Abdomen: Soft and nontender.  Normal active bowel sounds  Musculoskeletal: (Left hip: Painful range of motion.  Limited range of motion.  No instability. Labs: Recent Results (from the past 2160 hour(s))  APTT     Status: None   Collection Time: 08/21/18  3:03 PM  Result Value Ref Range   aPTT 27 24 - 36 seconds    Comment: Performed at [facility], [address] [place], [place] [address]  CBC WITH DIFFERENTIAL     Status: Abnormal   Collection Time: 08/21/18   3:03 PM  Result Value Ref Range   WBC 8.5 4.0 - 10.5 K/uL   RBC 3.87 3.87 - 5.11 MIL/uL   Hemoglobin 12.4 12.0 - 15.0 g/dL   HCT 91.4 78.2 - 95.6 %   MCV 102.6 (H) 80.0 - 100.0 fL   MCH 32.0 26.0 - 34.0 pg   MCHC 31.2 30.0 - 36.0 g/dL   RDW 21.3 08.6 - 57.8 %   Platelets 233 150 - 400 K/uL   nRBC 0.0 0.0 - 0.2 %   Neutrophils Relative % 64 %   Neutro Abs 5.6 1.7 - 7.7 K/uL   Lymphocytes Relative 25 %   Lymphs Abs 2.1 0.7 - 4.0 K/uL   Monocytes Relative 9 %   Monocytes Absolute 0.7 0.1 - 1.0 K/uL   Eosinophils Relative 1 %   Eosinophils Absolute 0.1 0.0 - 0.5 K/uL   Basophils Relative 0 %   Basophils Absolute 0.0 0.0 - 0.1 K/uL   Immature Granulocytes 1 %   Abs Immature Granulocytes 0.05 0.00 - 0.07 K/uL    Comment: Performed at [facility], [address], [place], [place] [address]  Comprehensive metabolic panel     Status: None   Collection Time: 08/21/18  3:03 PM  Result Value Ref Range   Sodium 138 135 - 145 mmol/L   Potassium 3.9 3.5 - 5.1 mmol/L   Chloride 105 98 - 111 mmol/L   CO2 24 22 - 32 mmol/L   Glucose, Bld 92 70 - 99 mg/dL   BUN 18 6 - 20 mg/dL   Creatinine, Ser 9.52 0.44 - 1.00 mg/dL   Calcium 9.2 8.9 - 84.1 mg/dL   Total Protein 7.5 6.5 - 8.1 g/dL   Albumin 4.4 3.5 - 5.0 g/dL   AST 24 15 - 41 U/L   ALT 26 0 - 44 U/L   Alkaline Phosphatase 124 38 - 126 U/L   Total Bilirubin 0.6 0.3 - 1.2 mg/dL   GFR calc non Af Amer >60 >60 mL/min   GFR calc Af Amer >60 >60 mL/min   Anion gap 9 5 - 15    Comment: Performed at [facility], [address] [place], [place] [address]  Protime-INR     Status: None   Collection Time: 08/21/18  3:03 PM  Result Value Ref Range   Prothrombin Time 12.9 11.4 - 15.2 seconds   INR 1.0 0.8 - 1.2    Comment: (NOTE) INR goal varies based on device and disease states. Performed at [facility], [address], [place], [place] [address]   Type and screen Order type and  screen if day of surgery is less than 15 days from draw of preadmission visit or order morning of surgery if day of surgery is greater than 6 days from preadmission visit.     Status: None   Collection Time:  08/21/18  3:03 PM  Result Value Ref Range   ABO/RH(D) A POS    Antibody Screen NEG    Sample Expiration 08/25/2018    Extend sample reason      NO TRANSFUSIONS OR PREGNANCY IN THE PAST 3 MONTHS Performed at [facility], [address] [place], [place] [address]   Urinalysis, Routine w reflex microscopic     Status: None   Collection Time: 08/21/18  3:03 PM  Result Value Ref Range   Color, Urine YELLOW YELLOW   APPearance CLEAR CLEAR   Specific Gravity, Urine 1.019 1.005 - 1.030   pH 5.0 5.0 - 8.0   Glucose, UA NEGATIVE NEGATIVE mg/dL   Hgb urine dipstick NEGATIVE NEGATIVE   Bilirubin Urine NEGATIVE NEGATIVE   Ketones, ur NEGATIVE NEGATIVE mg/dL   Protein, ur NEGATIVE NEGATIVE mg/dL   Nitrite NEGATIVE NEGATIVE   Leukocytes,Ua NEGATIVE NEGATIVE    Comment: Performed at [facility], [address] [place], [place] [address]  Surgical pcr screen     Status: None   Collection Time: 08/21/18  3:03 PM  Result Value Ref Range   MRSA, PCR NEGATIVE NEGATIVE   Staphylococcus aureus NEGATIVE NEGATIVE    Comment: (NOTE) The Xpert SA Assay (FDA approved for NASAL specimens in patients 84 years of age and older), is one component of a comprehensive surveillance program. It is not intended to diagnose infection nor to guide or monitor treatment. Performed at [facility] [address] [place], [place] [address]   ABO/Rh     Status: None   Collection Time: 08/21/18  3:03 PM  Result Value Ref Range   ABO/RH(D)      A POS Performed at [facility], [address], [place], [place] [address]     Estimated body mass index is 29.23 kg/m as calculated from the following:   Height as of 08/21/18: 5\' 3"  (1.6  m).   Weight as of 08/21/18: 74.8 kg.   Imaging Review Plain radiographs demonstrate severe degenerative joint disease of the left hip(s). The bone quality appears to be fair for age and reported activity level.      Assessment/Plan:  End stage arthritis, left hip(s)  The patient history, physical examination, clinical judgement of the provider and imaging studies are consistent with end stage degenerative joint disease of the left hip(s) and total hip arthroplasty is deemed medically necessary. The treatment options including medical management, injection therapy, arthroscopy and arthroplasty were discussed at length. The risks and benefits of total hip arthroplasty were presented and reviewed. The risks due to aseptic loosening, infection, stiffness, dislocation/subluxation,  thromboembolic complications and other imponderables were discussed.  The patient acknowledged the explanation, agreed to proceed with the plan and consent was signed. Patient is being admitted for inpatient treatment for surgery, pain control, PT, OT, prophylactic antibiotics, VTE prophylaxis, progressive ambulation and ADL's and discharge planning.The patient is planning to be discharged home with home health services

## 2018-08-22 NOTE — Brief Op Note (Signed)
08/22/2018  2:41 PM  PATIENT:  Patricia Mclean  48 y.o. female  PRE-OPERATIVE DIAGNOSIS:  AVASCULAR NECROSIS LEFT HIP  POST-OPERATIVE DIAGNOSIS:  AVASCULAR NECROSIS LEFT HIP  PROCEDURE:  Procedure(s): TOTAL HIP ARTHROPLASTY ANTERIOR APPROACH (Left)  SURGEON:  Surgeon(s) and Role:    Jodi Geralds, MD - Primary  PHYSICIAN ASSISTANT:   ASSISTANTS: jim bethune   ANESTHESIA:   spinal  EBL:  350 mL   BLOOD ADMINISTERED:none  DRAINS: none   LOCAL MEDICATIONS USED:  MARCAINE    and OTHER experel  SPECIMEN:  No Specimen  DISPOSITION OF SPECIMEN:  N/A  COUNTS:  YES  TOURNIQUET:  * No tourniquets in log *  DICTATION: .Other Dictation: Dictation Number [id number]  PLAN OF CARE: Admit for overnight observation  PATIENT DISPOSITION:  PACU - hemodynamically stable.   Delay start of Pharmacological VTE agent (>24hrs) due to surgical blood loss or risk of bleeding: no

## 2018-08-22 NOTE — Discharge Instructions (Signed)
INSTRUCTIONS AFTER JOINT REPLACEMENT  ° °o Remove items at home which could result in a fall. This includes throw rugs or furniture in walking pathways °o ICE to the affected joint every three hours while awake for 30 minutes at a time, for at least the first 3-5 days, and then as needed for pain and swelling.  Continue to use ice for pain and swelling. You may notice swelling that will progress down to the foot and ankle.  This is normal after surgery.  Elevate your leg when you are not up walking on it.   °o Continue to use the breathing machine you got in the hospital (incentive spirometer) which will help keep your temperature down.  It is common for your temperature to cycle up and down following surgery, especially at night when you are not up moving around and exerting yourself.  The breathing machine keeps your lungs expanded and your temperature down. ° ° °DIET:  As you were doing prior to hospitalization, we recommend a well-balanced diet. ° °DRESSING / WOUND CARE / SHOWERING ° °Keep the surgical dressing until follow up.  The dressing is water proof, so you can shower without any extra covering.  IF THE DRESSING FALLS OFF or the wound gets wet inside, change the dressing with sterile gauze.  Please use good hand washing techniques before changing the dressing.  Do not use any lotions or creams on the incision until instructed by your surgeon.   ° °ACTIVITY ° °o Increase activity slowly as tolerated, but follow the weight bearing instructions below.   °o No driving for 6 weeks or until further direction given by your physician.  You cannot drive while taking narcotics.  °o No lifting or carrying greater than 10 lbs. until further directed by your surgeon. °o Avoid periods of inactivity such as sitting longer than an hour when not asleep. This helps prevent blood clots.  °o You may return to work once you are authorized by your doctor.  ° ° ° °WEIGHT BEARING  ° °Weight bearing as tolerated with assist  device (walker, cane, etc) as directed, use it as long as suggested by your surgeon or therapist, typically at least 4-6 weeks. ° ° °EXERCISES ° °Results after joint replacement surgery are often greatly improved when you follow the exercise, range of motion and muscle strengthening exercises prescribed by your doctor. Safety measures are also important to protect the joint from further injury. Any time any of these exercises cause you to have increased pain or swelling, decrease what you are doing until you are comfortable again and then slowly increase them. If you have problems or questions, call your caregiver or physical therapist for advice.  ° °Rehabilitation is important following a joint replacement. After just a few days of immobilization, the muscles of the leg can become weakened and shrink (atrophy).  These exercises are designed to build up the tone and strength of the thigh and leg muscles and to improve motion. Often times heat used for twenty to thirty minutes before working out will loosen up your tissues and help with improving the range of motion but do not use heat for the first two weeks following surgery (sometimes heat can increase post-operative swelling).  ° °These exercises can be done on a training (exercise) mat, on the floor, on a table or on a bed. Use whatever works the best and is most comfortable for you.    Use music or television while you are exercising so that   the exercises are a pleasant break in your day. This will make your life better with the exercises acting as a break in your routine that you can look forward to.   Perform all exercises about fifteen times, three times per day or as directed.  You should exercise both the operative leg and the other leg as well. ° °Exercises include: °  °• Quad Sets - Tighten up the muscle on the front of the thigh (Quad) and hold for 5-10 seconds.   °• Straight Leg Raises - With your knee straight (if you were given a brace, keep it on),  lift the leg to 60 degrees, hold for 3 seconds, and slowly lower the leg.  Perform this exercise against resistance later as your leg gets stronger.  °• Leg Slides: Lying on your back, slowly slide your foot toward your buttocks, bending your knee up off the floor (only go as far as is comfortable). Then slowly slide your foot back down until your leg is flat on the floor again.  °• Angel Wings: Lying on your back spread your legs to the side as far apart as you can without causing discomfort.  °• Hamstring Strength:  Lying on your back, push your heel against the floor with your leg straight by tightening up the muscles of your buttocks.  Repeat, but this time bend your knee to a comfortable angle, and push your heel against the floor.  You may put a pillow under the heel to make it more comfortable if necessary.  ° °A rehabilitation program following joint replacement surgery can speed recovery and prevent re-injury in the future due to weakened muscles. Contact your doctor or a physical therapist for more information on knee rehabilitation.  ° ° °CONSTIPATION ° °Constipation is defined medically as fewer than three stools per week and severe constipation as less than one stool per week.  Even if you have a regular bowel pattern at home, your normal regimen is likely to be disrupted due to multiple reasons following surgery.  Combination of anesthesia, postoperative narcotics, change in appetite and fluid intake all can affect your bowels.  ° °YOU MUST use at least one of the following options; they are listed in order of increasing strength to get the job done.  They are all available over the counter, and you may need to use some, POSSIBLY even all of these options:   ° °Drink plenty of fluids (prune juice may be helpful) and high fiber foods °Colace 100 mg by mouth twice a day  °Senokot for constipation as directed and as needed Dulcolax (bisacodyl), take with full glass of water  °Miralax (polyethylene glycol)  once or twice a day as needed. ° °If you have tried all these things and are unable to have a bowel movement in the first 3-4 days after surgery call either your surgeon or your primary doctor.   ° °If you experience loose stools or diarrhea, hold the medications until you stool forms back up.  If your symptoms do not get better within 1 week or if they get worse, check with your doctor.  If you experience "the worst abdominal pain ever" or develop nausea or vomiting, please contact the office immediately for further recommendations for treatment. ° ° °ITCHING:  If you experience itching with your medications, try taking only a single pain pill, or even half a pain pill at a time.  You can also use Benadryl over the counter for itching or also to   help with sleep.   TED HOSE STOCKINGS:  Use stockings on both legs until for at least 2 weeks or as directed by physician office. They may be removed at night for sleeping.  MEDICATIONS:  See your medication summary on the After Visit Summary that nursing will review with you.  You may have some home medications which will be placed on hold until you complete the course of blood thinner medication.  It is important for you to complete the blood thinner medication as prescribed.  PRECAUTIONS:  If you experience chest pain or shortness of breath - call 911 immediately for transfer to the hospital emergency department.   If you develop a fever greater that 101 F, purulent drainage from wound, increased redness or drainage from wound, foul odor from the wound/dressing, or calf pain - CONTACT YOUR SURGEON.                                                   FOLLOW-UP APPOINTMENTS:  If you do not already have a post-op appointment, please call the office for an appointment to be seen by your surgeon.  Guidelines for how soon to be seen are listed in your After Visit Summary, but are typically between 1-4 weeks after surgery.  OTHER INSTRUCTIONS:   Knee  Replacement:  Do not place pillow under knee, focus on keeping the knee straight while resting. CPM instructions: 0-90 degrees, 2 hours in the morning, 2 hours in the afternoon, and 2 hours in the evening. Place foam block, curve side up under heel at all times except when in CPM or when walking.  DO NOT modify, tear, cut, or change the foam block in any way.  MAKE SURE YOU:   Understand these instructions.   Get help right away if you are not doing well or get worse.   Outpatient physical therapy at [facility] next Monday or Tuesday.  They will call you for an appointment.  If you do not hear from them call them at [phone].   Thank you for letting us be a part of your medical care team.  It is a privilege we respect greatly.  We hope these instructions will help you stay on track for a fast and full recovery!

## 2018-08-22 NOTE — Anesthesia Preprocedure Evaluation (Signed)
Anesthesia Evaluation  Patient identified by MRN, date of birth, ID band Patient awake    Reviewed: Allergy & Precautions, H&P , NPO status , Patient's Chart, lab work & pertinent test results  Airway Mallampati: II   Neck ROM: full    Dental   Pulmonary neg pulmonary ROS,    breath sounds clear to auscultation       Cardiovascular negative cardio ROS   Rhythm:regular Rate:Normal     Neuro/Psych  Headaches, PSYCHIATRIC DISORDERS Anxiety    GI/Hepatic GERD  ,  Endo/Other    Renal/GU      Musculoskeletal   Abdominal   Peds  Hematology   Anesthesia Other Findings   Reproductive/Obstetrics                             Anesthesia Physical Anesthesia Plan  ASA: II  Anesthesia Plan: Spinal   Post-op Pain Management:    Induction: Intravenous  PONV Risk Score and Plan: 2 and Propofol infusion, Ondansetron and Treatment may vary due to age or medical condition  Airway Management Planned: Simple Face Mask  Additional Equipment:   Intra-op Plan:   Post-operative Plan:   Informed Consent: I have reviewed the patients History and Physical, chart, labs and discussed the procedure including the risks, benefits and alternatives for the proposed anesthesia with the patient or authorized representative who has indicated his/her understanding and acceptance.       Plan Discussed with: CRNA, Anesthesiologist and Surgeon  Anesthesia Plan Comments:         Anesthesia Quick Evaluation

## 2018-08-22 NOTE — Anesthesia Procedure Notes (Signed)
Spinal  Patient location during procedure: OR Start time: 08/22/2018 12:02 PM End time: 08/22/2018 12:08 PM Staffing Resident/CRNA: Lollie Sails, CRNA Performed: resident/CRNA  Preanesthetic Checklist Completed: patient identified, site marked, surgical consent, pre-op evaluation, timeout performed, IV checked, risks and benefits discussed and monitors and equipment checked Spinal Block Patient position: sitting Prep: site prepped and draped and DuraPrep Patient monitoring: heart rate, continuous pulse ox, blood pressure and cardiac monitor Approach: midline Location: L2-3 Injection technique: single-shot Needle Needle type: Sprotte  Needle gauge: 24 G Needle length: 9 cm Additional Notes Expiration date on kit noted and within range.  Good CSF flow noted with no heme or c/o paresthesia.   Patient tolerated well.

## 2018-08-22 NOTE — Transfer of Care (Signed)
Immediate Anesthesia Transfer of Care Note  Patient: Patricia Mclean  Procedure(s) Performed: TOTAL HIP ARTHROPLASTY ANTERIOR APPROACH (Left Hip)  Patient Location: PACU  Anesthesia Type:Spinal  Level of Consciousness: awake, drowsy and patient cooperative  Airway & Oxygen Therapy: Patient Spontanous Breathing and Patient connected to face mask oxygen  Post-op Assessment: Report given to RN and Post -op Vital signs reviewed and stable  Post vital signs: Reviewed and stable  Last Vitals:  Vitals Value Taken Time  BP 100/59 08/22/2018  2:26 PM  Temp    Pulse 70 08/22/2018  2:28 PM  Resp 13 08/22/2018  2:28 PM  SpO2 100 % 08/22/2018  2:28 PM  Vitals shown include unvalidated device data.  Last Pain:  Vitals:   08/22/18 1059  TempSrc:   PainSc: 5          Complications: No apparent anesthesia complications

## 2018-08-22 NOTE — Anesthesia Procedure Notes (Signed)
Date/Time: 08/22/2018 12:03 PM Performed by: Elyn Peers, CRNA Pre-anesthesia Checklist: Patient identified, Emergency Drugs available, Suction available and Patient being monitored Oxygen Delivery Method: Simple face mask

## 2018-08-23 DIAGNOSIS — M879 Osteonecrosis, unspecified: Secondary | ICD-10-CM | POA: Diagnosis not present

## 2018-08-23 LAB — CBC
HCT: 31.7 % — ABNORMAL LOW (ref 36.0–46.0)
Hemoglobin: 10.1 g/dL — ABNORMAL LOW (ref 12.0–15.0)
MCH: 32.7 pg (ref 26.0–34.0)
MCHC: 31.9 g/dL (ref 30.0–36.0)
MCV: 102.6 fL — ABNORMAL HIGH (ref 80.0–100.0)
NRBC: 0 % (ref 0.0–0.2)
Platelets: 167 10*3/uL (ref 150–400)
RBC: 3.09 MIL/uL — ABNORMAL LOW (ref 3.87–5.11)
RDW: 12.4 % (ref 11.5–15.5)
WBC: 11.7 10*3/uL — ABNORMAL HIGH (ref 4.0–10.5)

## 2018-08-23 NOTE — Plan of Care (Signed)
  Problem: Education: Goal: Knowledge of the prescribed therapeutic regimen will improve Outcome: Progressing   Problem: Activity: Goal: Ability to avoid complications of mobility impairment will improve Outcome: Progressing   Problem: Activity: Goal: Ability to tolerate increased activity will improve Outcome: Progressing   Problem: Pain Management: Goal: Pain level will decrease with appropriate interventions Outcome: Progressing   Problem: Clinical Measurements: Goal: Ability to maintain clinical measurements within normal limits will improve Outcome: Progressing

## 2018-08-23 NOTE — Plan of Care (Signed)
Pt stable at this time. No changes needed to care plans. Pt to d/c home today if she does well with physical therapy.

## 2018-08-23 NOTE — Progress Notes (Signed)
Subjective: 1 Day Post-Op Procedure(s) (LRB): TOTAL HIP ARTHROPLASTY ANTERIOR APPROACH (Left)  Patient reports minimal pain while laying still but when standing she notes significant pain in the left hip region.  She has not mobilized much with physical therapy.  Otherwise she is doing well.  She denies any shortness of breath.  Denies any fevers or chills.  Objective: Vital signs in last 24 hours: Temp:  [97.5 F (36.4 C)-98.4 F (36.9 C)] 97.5 F (36.4 C) (03/14 0533) Pulse Rate:  [65-83] 69 (03/14 0533) Resp:  [11-16] 16 (03/14 0533) BP: (100-121)/(59-100) 108/71 (03/14 0533) SpO2:  [97 %-100 %] 100 % (03/14 0533) Weight:  [74.8 kg] 74.8 kg (03/13 1816)  Intake/Output from previous day: 03/13 0701 - 03/14 0700 In: 3473.6 [P.O.:120; I.V.:2803.6; IV Piggyback:550] Out: 3900 [Urine:3550; Blood:350] Intake/Output this shift: Total I/O In: 368.3 [P.O.:240; I.V.:128.3] Out: -   Recent Labs    08/21/18 1503 08/23/18 0458  HGB 12.4 10.1*   Recent Labs    08/21/18 1503 08/23/18 0458  WBC 8.5 11.7*  RBC 3.87 3.09*  HCT 39.7 31.7*  PLT 233 167   Recent Labs    08/21/18 1503  NA 138  K 3.9  CL 105  CO2 24  BUN 18  CREATININE 0.72  GLUCOSE 92  CALCIUM 9.2   Recent Labs    08/21/18 1503  INR 1.0    Left hip demonstrates dressing intact.  No drainage.  No surrounding erythema.  Hip is in normal resting position.  Leg lengths appear symmetric grossly.  She is able to actively dorsiflex plantarflex the ankle.  Sensation tachycardia dorsal plantar foot.  Foot is warm well perfused.  No significant pain with logroll of the hip.   Assessment/Plan: 1 Day Post-Op Procedure(s) (LRB): TOTAL HIP ARTHROPLASTY ANTERIOR APPROACH (Left)  Patient is doing well but having some pain with standing.  She will mobilize with physical therapy and I discussed that if the pain seems out of proportion we can always get an x-ray to ensure no periprosthetic fracture or implant failure.   Hopefully she will be able to discharge home today after mobilizing with therapy.  Prescription is been sent.  She will take an aspirin for DVT prophylaxis.  She will follow-up with Dr. Luiz Blare as scheduled.   Terance Hart 08/23/2018, 7:53 AM

## 2018-08-23 NOTE — Op Note (Signed)
NAME: Patricia Mclean, Patricia Mclean MEDICAL RECORD TF:[id number] ACCOUNT [id number] DATE OF BIRTH:[birth year] FACILITY: [facility] LOCATION: [facility] PHYSICIAN:Tyrik Stetzer L. Finnean Cerami, MD  OPERATIVE REPORT  DATE OF PROCEDURE:  08/22/2018  PREOPERATIVE DIAGNOSIS:  Avascular necrosis, with collapse, left hip.  POSTOPERATIVE DIAGNOSIS:  Avascular necrosis, with collapse, left hip.  PROCEDURE: 1.  Left total hip replacement with a Corail stem size 10, 52 mm Pinnacle cup, porous-coated, a 36 mm neutral liner and a delta ceramic hip ball +1.5. 2.  Interpretation of multiple intraoperative fluoroscopic images.  SURGEON:  Jodi Geralds, MD  ASSISTANT:  Gus Puma, PA-C, was present for the entire case and assisted by retraction, manipulation of the leg, and closing to minimize OR time.  BRIEF HISTORY:  The patient is a 48 year old female with a long history of significant complaints of left hip pain.  She was evaluated in the office and noted to have severe avascular necrosis with collapse.  She was having horrible and agonizing pain  with inability to walk or sleep.  We had a long discussion with her and felt that she needed a left total hip replacement.  She was brought to the operating room for this procedure.  DESCRIPTION OF PROCEDURE:  The patient was brought to the operating room.  After adequate anesthesia was obtained with a spinal anesthetic, she was placed supine on the operating table.  She was then moved onto the [place] bed.  All bony prominences were  well padded.  Attention was then turned to the left hip where after routine prep and drape, an incision was made for an anterior approach to the hip, subcutaneous down to the level of the tensor fascia.  Tensor fascia was identified and divided in line  with its fibers.  Finger dissection of the muscle was undertaken.  Retractors were put in place above and below the neck.  A provisional capsular opening was made and tagged, and a provisional neck cut was made.   Following this, attention was turned  towards the retractors above and below the acetabulum, and the acetabulum was sequentially reamed to a level of 51 mm.  A 52 mm porous-coated cup was hammered into place, 45 degrees of lateral opening, 30 degrees of anteversion.  Following this,  attention was turned towards the stem, which was externally rotated and extended and adducted and with the arm put in place on the [place] bed.  The femur was then identified.  It was opened and reamed and rasped up sequentially to a level of 10.  Excellent  stability was achieved with a 10.  We trialed an 55 which was a little too tight, and she went back to the 10, countersunk it.  The calcar planed it.  Excellent stability was achieved.  I put a +1.5 hip ball on.  Excellent range of motion and stability  were achieved, and the leg lengths were symmetric by fluoroscopic imaging.  At this point, the rasp was removed.  The final stem was opened and placed.  The hip ball was placed and a reduction was undertaken.  Final fluoroscopic images were taken.  The  wound was irrigated, suctioned dry.  The capsule was closed.  The tensor fascia was closed with 0 Vicryl running, the skin with 0 and 2-0 Vicryl and 3-0 Monocryl subcuticular.  Benzoin and Steri-Strips were applied.  A sterile compressive dressing was  applied.  The patient was taken to recovery and was noted to be in satisfactory condition.  Of note, Gus Puma was present for the  entire case and assisted by retraction, manipulation of the leg, and closing to minimize OR time.  The estimated blood  loss for the procedure was 350 mL.  The final can be gotten from the anesthetic record.  LN/NUANCE  D:08/22/2018 T:08/23/2018 JOB:[id number]

## 2018-08-23 NOTE — Evaluation (Signed)
Physical Therapy Evaluation Patient Details Name: Patricia Mclean MRN: [id number] DOB: 14-Jan-1971 Today's Date: 08/23/2018   History of Present Illness  48 yo female s/pL DA-THA on 08/22/18. PMH includes anxiety, GERD, headache, PNA.   Clinical Impression  Pt presents with severe L hip pain with ambulation, difficulty performing bed mobility, increased time and effort to perform all mobility tasks, and limited activity tolerance due to L hip pain. Pt to benefit from acute PT to address deficits. Pt ambulated 60 ft total this session, very limited by hip pain especially during LLE swing phase of gait. Pt educated on ankle pumps (20/hour) to perform for circulation. PT to check back on pt this afternoon to work on LE exercises and ambulation.     Follow Up Recommendations Follow surgeon's recommendation for DC plan and follow-up therapies;Supervision for mobility/OOB(HHPT to start on Monday per pt report)    Equipment Recommendations  Rolling walker with 5" wheels;3in1 (PT)    Recommendations for Other Services       Precautions / Restrictions Precautions Precautions: Fall Restrictions Weight Bearing Restrictions: No LLE Weight Bearing: Weight bearing as tolerated      Mobility  Bed Mobility Overal bed mobility: Needs Assistance Bed Mobility: Supine to Sit;Sit to Supine     Supine to sit: Min assist;HOB elevated Sit to supine: Min assist;HOB elevated   General bed mobility comments: Min assist for supine<>sit LLE lifting and translation to and from EOB, pt tearful when lifting LLE. Very increased time to come to EOB.   Transfers Overall transfer level: Needs assistance Equipment used: Rolling walker (2 wheeled) Transfers: Sit to/from Stand Sit to Stand: Min assist;From elevated surface         General transfer comment: min assist for power up, steadying. Increased time to rise and come to erect standing due to pain.   Ambulation/Gait Ambulation/Gait assistance: Min  guard Gait Distance (Feet): 40 Feet(1x20 ft to and from bathroom, 1x40 ft ambulation in hallway) Assistive device: Rolling walker (2 wheeled) Gait Pattern/deviations: Step-to pattern;Decreased step length - left;Decreased stance time - left;Antalgic;Trunk flexed Gait velocity: very decr    General Gait Details: min guard for safety. Pt tearful during initial ambulation due to pain and suspected anxiety. verbal cuing for placement in RW, sequencing with step-to gait LLE leading, turning with RW, upright posture.   Stairs            Wheelchair Mobility    Modified Rankin (Stroke Patients Only)       Balance Overall balance assessment: Mild deficits observed, not formally tested                                           Pertinent Vitals/Pain Pain Assessment: 0-10 Pain Score: 8  Pain Location: L hip, with ambulation Pain Descriptors / Indicators: Sore;Tightness Pain Intervention(s): Limited activity within patient's tolerance;Premedicated before session;Monitored during session;Repositioned;Ice applied    Home Living Family/patient expects to be discharged to:: Private residence Living Arrangements: Spouse/significant other Available Help at Discharge: Family;Available PRN/intermittently Type of Home: House Home Access: Stairs to enter Entrance Stairs-Rails: Left Entrance Stairs-Number of Steps: 4 Home Layout: One level Home Equipment: None      Prior Function Level of Independence: Independent         Comments: Pt reports she "hobbled" around prior to admission.      Hand Dominance   Dominant Hand: Right  Extremity/Trunk Assessment   Upper Extremity Assessment Upper Extremity Assessment: Overall WFL for tasks assessed    Lower Extremity Assessment Lower Extremity Assessment: Overall WFL for tasks assessed;LLE deficits/detail LLE Deficits / Details: suspected post-surgical weakness; able to perform ankle pumps, quad set, heel slides  x3 limited ROM, lift assisted SLR LLE Sensation: WNL    Cervical / Trunk Assessment Cervical / Trunk Assessment: Normal  Communication   Communication: No difficulties  Cognition Arousal/Alertness: Awake/alert Behavior During Therapy: Anxious;WFL for tasks assessed/performed Overall Cognitive Status: Within Functional Limits for tasks assessed                                        General Comments      Exercises Total Joint Exercises Ankle Circles/Pumps: Both;AROM;5 reps Quad Sets: AROM;Left;5 reps;Supine Heel Slides: AROM;Left;5 reps;Supine   Assessment/Plan    PT Assessment Patient needs continued PT services  PT Problem List Decreased strength;Decreased mobility;Decreased range of motion;Decreased activity tolerance;Decreased balance;Decreased knowledge of use of DME;Pain       PT Treatment Interventions Functional mobility training;DME instruction;Balance training;Patient/family education;Gait training;Therapeutic activities;Stair training;Therapeutic exercise    PT Goals (Current goals can be found in the Care Plan section)  Acute Rehab PT Goals Patient Stated Goal: walk without pain  PT Goal Formulation: With patient Time For Goal Achievement: 08/30/18 Potential to Achieve Goals: Good    Frequency 7X/week   Barriers to discharge        Co-evaluation               AM-PAC PT "6 Clicks" Mobility  Outcome Measure Help needed turning from your back to your side while in a flat bed without using bedrails?: A Little Help needed moving from lying on your back to sitting on the side of a flat bed without using bedrails?: A Little Help needed moving to and from a bed to a chair (including a wheelchair)?: A Little Help needed standing up from a chair using your arms (e.g., wheelchair or bedside chair)?: A Little Help needed to walk in hospital room?: A Little Help needed climbing 3-5 steps with a railing? : A Lot 6 Click Score: 17    End of  Session Equipment Utilized During Treatment: Gait belt Activity Tolerance: Patient tolerated treatment well;Patient limited by pain Patient left: in bed;with bed alarm set;with family/visitor present;with call bell/phone within reach Nurse Communication: Mobility status PT Visit Diagnosis: Other abnormalities of gait and mobility (R26.89);Difficulty in walking, not elsewhere classified (R26.2)    Time: 6834-1962 PT Time Calculation (min) (ACUTE ONLY): 43 min   Charges:   PT Evaluation $PT Eval Low Complexity: 1 Low PT Treatments $Gait Training: 8-22 mins       Nicola Police, PT Acute Rehabilitation Services Pager [phone]  Office [phone]   Tabias Swayze D Despina Hidden 08/23/2018, 1:53 PM

## 2018-08-23 NOTE — Progress Notes (Signed)
Physical Therapy Treatment Patient Details Name: Patricia Mclean MRN: [id number] DOB: 1971-06-11 Today's Date: 08/23/2018    History of Present Illness 48 yo female s/pL DA-THA on 08/22/18. PMH includes anxiety, GERD, headache, PNA.     PT Comments    Pt with improved ambulation distance this session, and pt less in pain during ambulation and LE exercises. Pt struggles most with hip flexion movement, in both LE exercises and during ambulation. Exercise handout administered and reviewed with pt. PT to practice stairs with pt tomorrow prior to d/c. PT to continue to follow acutely.   Follow Up Recommendations  Follow surgeon's recommendation for DC plan and follow-up therapies;Supervision for mobility/OOB(OPPT to start on Monday per pt report)     Equipment Recommendations  Rolling walker with 5" wheels;3in1 (PT)    Recommendations for Other Services       Precautions / Restrictions Precautions Precautions: Fall Restrictions Weight Bearing Restrictions: No LLE Weight Bearing: Weight bearing as tolerated    Mobility  Bed Mobility Overal bed mobility: Needs Assistance Bed Mobility: Supine to Sit;Sit to Supine     Supine to sit: Min assist;HOB elevated Sit to supine: Min assist;HOB elevated   General bed mobility comments: Min assist for supine<>sit LLE lifting and translation to and from EOB, verbal cuing to use RLE under LLE to assist with lifting.   Transfers Overall transfer level: Needs assistance Equipment used: Rolling walker (2 wheeled) Transfers: Sit to/from Stand Sit to Stand: From elevated surface;Min guard         General transfer comment: Min guard for safety, increased time to come to full standing due to pain.   Ambulation/Gait Ambulation/Gait assistance: Min guard Gait Distance (Feet): 80 Feet Assistive device: Rolling walker (2 wheeled) Gait Pattern/deviations: Step-to pattern;Decreased step length - left;Decreased stance time -  left;Antalgic;Trunk flexed Gait velocity: very decr    General Gait Details: Min guard for safety. Verbal cuing for step-to gait with LLE leading, but pt insisted that this was much more painful.    Stairs             Wheelchair Mobility    Modified Rankin (Stroke Patients Only)       Balance Overall balance assessment: Mild deficits observed, not formally tested                                          Cognition Arousal/Alertness: Awake/alert Behavior During Therapy: WFL for tasks assessed/performed Overall Cognitive Status: Within Functional Limits for tasks assessed                                        Exercises Total Joint Exercises Quad Sets: AROM;Left;Supine;10 reps Heel Slides: Left;5 reps;Supine;AAROM Hip ABduction/ADduction: AAROM;Left;10 reps;Supine    General Comments        Pertinent Vitals/Pain Pain Assessment: 0-10 Pain Score: 8  Pain Location: L hip, getting on and off toilet Pain Descriptors / Indicators: Sore Pain Intervention(s): Limited activity within patient's tolerance;Premedicated before session;Monitored during session;Repositioned    Home Living                      Prior Function            PT Goals (current goals can now be found in the care plan section) Acute  Rehab PT Goals Patient Stated Goal: walk without pain  PT Goal Formulation: With patient Time For Goal Achievement: 08/30/18 Potential to Achieve Goals: Good Progress towards PT goals: Progressing toward goals    Frequency    7X/week      PT Plan Current plan remains appropriate    Co-evaluation              AM-PAC PT "6 Clicks" Mobility   Outcome Measure  Help needed turning from your back to your side while in a flat bed without using bedrails?: A Little Help needed moving from lying on your back to sitting on the side of a flat bed without using bedrails?: A Little Help needed moving to and from a bed  to a chair (including a wheelchair)?: A Little Help needed standing up from a chair using your arms (e.g., wheelchair or bedside chair)?: A Little Help needed to walk in hospital room?: A Little Help needed climbing 3-5 steps with a railing? : A Lot 6 Click Score: 17    End of Session Equipment Utilized During Treatment: Gait belt Activity Tolerance: Patient tolerated treatment well;Patient limited by pain Patient left: in bed;with bed alarm set;with family/visitor present;with call bell/phone within reach Nurse Communication: Mobility status PT Visit Diagnosis: Other abnormalities of gait and mobility (R26.89);Difficulty in walking, not elsewhere classified (R26.2)     Time: 7858-8502 PT Time Calculation (min) (ACUTE ONLY): 39 min  Charges:  $Gait Training: 8-22 mins $Therapeutic Exercise: 8-22 mins                     Nicola Police, PT Acute Rehabilitation Services Pager [phone]  Office [phone]    Tyrone Apple D Despina Hidden 08/23/2018, 6:31 PM

## 2018-08-24 DIAGNOSIS — M879 Osteonecrosis, unspecified: Secondary | ICD-10-CM | POA: Diagnosis not present

## 2018-08-24 LAB — CBC
HCT: 28.9 % — ABNORMAL LOW (ref 36.0–46.0)
Hemoglobin: 9.3 g/dL — ABNORMAL LOW (ref 12.0–15.0)
MCH: 32.9 pg (ref 26.0–34.0)
MCHC: 32.2 g/dL (ref 30.0–36.0)
MCV: 102.1 fL — ABNORMAL HIGH (ref 80.0–100.0)
PLATELETS: 169 10*3/uL (ref 150–400)
RBC: 2.83 MIL/uL — ABNORMAL LOW (ref 3.87–5.11)
RDW: 12.6 % (ref 11.5–15.5)
WBC: 14.4 10*3/uL — ABNORMAL HIGH (ref 4.0–10.5)
nRBC: 0 % (ref 0.0–0.2)

## 2018-08-24 NOTE — Plan of Care (Signed)
Pt stable this am and did well with physical therapy. No needs at this time. Pt did well with physical therapy. Family at bedside.

## 2018-08-24 NOTE — Discharge Summary (Addendum)
Patient ID: Patricia Mclean MRN: [id number] DOB/AGE: 02/24/1971 48 y.o.  Admit date: 08/22/2018 Discharge date: 08/24/2018  Admission Diagnoses:  Principal Problem:   Avascular necrosis of hip, left (HCC) Active Problems:   Avascular necrosis of femoral head, left [facility])   Discharge Diagnoses:  Same  Past Medical History:  Diagnosis Date  . Anxiety   . GERD (gastroesophageal reflux disease)   . Headache    metoprolol and topamx  . Pneumonia    x3 had vaccine    Surgeries: Procedure(s):Left TOTAL HIP ARTHROPLASTY ANTERIOR APPROACH on 08/22/2018   Consultants:   Discharged Condition: Improved  Hospital Course: Patricia Mclean is an 48 y.o. female who was admitted 08/22/2018 for operative treatment ofAvascular necrosis of hip, left (HCC). Patient has severe unremitting pain that affects sleep, daily activities, and work/hobbies. After pre-op clearance the patient was taken to the operating room on 08/22/2018 and underwent  Procedure(s):Left TOTAL HIP ARTHROPLASTY ANTERIOR APPROACH.    Patient is doing well today.  She stayed 1 extra night due to pain control issues.  Today she states her pain is much improved and she is able to mobilize with physical therapy.  She feels ready to discharge.  Evaluation left hip demonstrates dressing intact.  No significant drainage.  Some surrounding bruising which is appropriate.  She has intact ability to roll the hip, flex and extend the knee and dorsiflex and plantarflex ankle.  Endorses sensation light touch about the foot.   Patient was given perioperative antibiotics:  Anti-infectives (From admission, onward)   Start     Dose/Rate Route Frequency Ordered Stop   08/22/18 2300  vancomycin (VANCOCIN) 1,000 mg in sodium chloride 0.9 % 250 mL IVPB     1,000 mg 250 mL/hr over 60 Minutes Intravenous Every 12 hours 08/22/18 1538 08/23/18 0011   08/22/18 1045  ceFAZolin (ANCEF) IVPB 2g/100 mL premix  Status:  Discontinued     2 g 200  mL/hr over 30 Minutes Intravenous On call to O.R. 08/22/18 1031 08/22/18 1033   08/22/18 1045  vancomycin (VANCOCIN) IVPB 1000 mg/200 mL premix     1,000 mg 200 mL/hr over 60 Minutes Intravenous  Once 08/22/18 1034 08/22/18 1245       Patient was given sequential compression devices, early ambulation, and chemoprophylaxis to prevent DVT.  Patient benefited maximally from hospital stay and there were no complications.    Recent vital signs:  Patient Vitals for the past 24 hrs:  BP Temp Temp src Pulse Resp SpO2  08/24/18 0537 107/75 98.3 F (36.8 C) Oral 81 14 98 %  08/23/18 2141 105/63 98.1 F (36.7 C) Oral 82 14 100 %  08/23/18 1420 105/75 98 F (36.7 C) Oral 79 16 99 %  08/23/18 1034 114/77 97.8 F (36.6 C) Oral 84 20 100 %     Recent laboratory studies:  Recent Labs    08/21/18 1503 08/23/18 0458 08/24/18 0340  WBC 8.5 11.7* 14.4*  HGB 12.4 10.1* 9.3*  HCT 39.7 31.7* 28.9*  PLT 233 167 169  NA 138  --   --   K 3.9  --   --   CL 105  --   --   CO2 24  --   --   BUN 18  --   --   CREATININE 0.72  --   --   GLUCOSE 92  --   --   INR 1.0  --   --   CALCIUM 9.2  --   --  Discharge Medications:   Allergies as of 08/24/2018      Reactions   Penicillins Anaphylaxis, Shortness Of Breath   Did it involve swelling of the face/tongue/throat, SOB, or low BP? Yes Did it involve sudden or severe rash/hives, skin peeling, or any reaction on the inside of your mouth or nose? No Did you need to seek medical attention at a hospital or doctor's office? Yes When did it last happen?30+ years If all above answers are "NO", may proceed with cephalosporin use.   Avelox [moxifloxacin Hcl] Hives   Levofloxacin Hives      Medication List    TAKE these medications   acetaminophen 500 MG tablet Commonly known as:  TYLENOL Take 1,000 mg by mouth every 6 (six) hours as needed for moderate pain or headache.   ALPRAZolam 0.25 MG tablet Commonly known as:  XANAX Take 0.25  mg by mouth at bedtime as needed for anxiety or sleep.   aspirin EC 325 MG tablet Take 1 tablet (325 mg total) by mouth 2 (two) times daily after a meal. Take x 1 month post op to decrease risk of blood clots.   docusate sodium 100 MG capsule Commonly known as:  Colace Take 1 capsule (100 mg total) by mouth 2 (two) times daily.   metoprolol tartrate 50 MG tablet Commonly known as:  LOPRESSOR Take 25 mg by mouth 2 (two) times daily.   multivitamin with minerals Tabs tablet Take 1 tablet by mouth daily.   oxyCODONE-acetaminophen 5-325 MG tablet Commonly known as:  PERCOCET/ROXICET Take 1-2 tablets by mouth every 4 (four) hours as needed for severe pain.   oxyCODONE-acetaminophen 5-325 MG tablet Commonly known as:  PERCOCET/ROXICET Take 1-2 tablets by mouth every 6 (six) hours as needed for severe pain.   PARoxetine 20 MG tablet Commonly known as:  PAXIL Take 20 mg by mouth daily.   RABEprazole 20 MG tablet Commonly known as:  ACIPHEX Take 20 mg by mouth 2 (two) times daily.   ranitidine 300 MG tablet Commonly known as:  ZANTAC Take 300 mg by mouth every evening.   tiZANidine 2 MG tablet Commonly known as:  ZANAFLEX Take 1 tablet (2 mg total) by mouth every 8 (eight) hours as needed for muscle spasms.   topiramate 50 MG tablet Commonly known as:  TOPAMAX Take 50 mg by mouth 2 (two) times daily.            Durable Medical Equipment  (From admission, onward)         Start     Ordered   08/23/18 0924  For home use only DME 3 n 1  Once     08/23/18 0925   08/23/18 0924  For home use only DME Walker rolling  Once    Question:  Patient needs a walker to treat with the following condition  Answer:  S/P hip replacement   08/23/18 0925          Diagnostic Studies: Dg Chest 2 View  Result Date: 08/22/2018 CLINICAL DATA:  Preop evaluation for hip surgery EXAM: CHEST - 2 VIEW COMPARISON:  08/15/2017 FINDINGS: Normal heart size and vascularity. Lungs remain clear.  No focal pneumonia, collapse or consolidation. Negative for edema, effusion or pneumothorax. Trachea is midline. Mild scoliosis of the spine. Chronic compression fracture deformity of the T10 vertebra on the lateral view. Nonobstructive bowel gas pattern. IMPRESSION: Stable exam.  No acute chest process. Electronically Signed   By: Judie Petit.  Shick M.D.   On:  08/22/2018 07:55   Dg C-arm 1-60 Min-no Report  Result Date: 08/22/2018 Fluoroscopy was utilized by the requesting physician.  No radiographic interpretation.   Dg Hip Operative Unilat W Or W/o Pelvis Left  Result Date: 08/22/2018 CLINICAL DATA:  LEFT hip arthroplasty EXAM: OPERATIVE LEFT HIP (WITH PELVIS IF PERFORMED) 2 VIEWS TECHNIQUE: Fluoroscopic spot image(s) were submitted for interpretation post-operatively. COMPARISON:  07/04/2018 FINDINGS: LEFT total hip arthroplasty changes noted. No definite complicating features identified. IMPRESSION: LEFT total hip arthroplasty without definite complicating features. Electronically Signed   By: Harmon Pier M.D.   On: 08/22/2018 13:57    Disposition: Discharge disposition: 01-Home or Self Care       Discharge Instructions    Call MD / Call 911   Complete by:  As directed    If you experience chest pain or shortness of breath, CALL 911 and be transported to the hospital emergency room.  If you develope a fever above 101 F, pus (white drainage) or increased drainage or redness at the wound, or calf pain, call your surgeon's office.   Constipation Prevention   Complete by:  As directed    Drink plenty of fluids.  Prune juice may be helpful.  You may use a stool softener, such as Colace (over the counter) 100 mg twice a day.  Use MiraLax (over the counter) for constipation as needed.   Diet - low sodium heart healthy   Complete by:  As directed    Increase activity slowly as tolerated   Complete by:  As directed       Follow-up Information    Jodi Geralds, MD. Schedule an appointment as soon  as possible for a visit in 2 weeks.   Specialty:  Orthopedic Surgery Contact information: [address] [place] [address] [phone]            Signed: Terance Hart 08/24/2018, 7:45 AM

## 2018-08-24 NOTE — Anesthesia Postprocedure Evaluation (Signed)
Anesthesia Post Note  Patient: Patricia Mclean  Procedure(s) Performed: TOTAL HIP ARTHROPLASTY ANTERIOR APPROACH (Left Hip)     Patient location during evaluation: PACU Anesthesia Type: Spinal Level of consciousness: oriented and awake and alert Pain management: pain level controlled Vital Signs Assessment: post-procedure vital signs reviewed and stable Respiratory status: spontaneous breathing, respiratory function stable and patient connected to nasal cannula oxygen Cardiovascular status: blood pressure returned to baseline and stable Postop Assessment: no headache, no backache and no apparent nausea or vomiting Anesthetic complications: no    Last Vitals:  Vitals:   08/23/18 2141 08/24/18 0537  BP: 105/63 107/75  Pulse: 82 81  Resp: 14 14  Temp: 36.7 C 36.8 C  SpO2: 100% 98%    Last Pain:  Vitals:   08/24/18 0925  TempSrc:   PainSc: 3                  [name] S

## 2018-08-24 NOTE — Progress Notes (Signed)
Physical Therapy Treatment Patient Details Name: Patricia Mclean MRN: [id number] DOB: 09/09/1970 Today's Date: 08/24/2018    History of Present Illness 48 yo female s/pL DA-THA on 08/22/18. PMH includes anxiety, GERD, headache, PNA.     PT Comments    Pt progressing well with mobility. Min A for bed mobility, she ambulated 120' with RW with increased time. Stair training completed, pt demonstrates understanding of HEP. She is ready to DC home from PT standpoint.   Follow Up Recommendations  Follow surgeon's recommendation for DC plan and follow-up therapies;Supervision for mobility/OOB(OPPT to start on Monday per pt report)     Equipment Recommendations  Rolling walker with 5" wheels;3in1 (PT)    Recommendations for Other Services       Precautions / Restrictions Precautions Precautions: Fall Restrictions Weight Bearing Restrictions: No LLE Weight Bearing: Weight bearing as tolerated    Mobility  Bed Mobility Overal bed mobility: Needs Assistance Bed Mobility: Supine to Sit;Sit to Supine     Supine to sit: Min assist;HOB elevated Sit to supine: Min assist;HOB elevated   General bed mobility comments: Min assist for supine<>sit LLE lifting and translation to and from EOB, verbal cuing to use RLE under LLE to assist with lifting.   Transfers Overall transfer level: Needs assistance Equipment used: Rolling walker (2 wheeled) Transfers: Sit to/from Stand Sit to Stand: From elevated surface;Min guard         General transfer comment: Min guard for safety, increased time to come to full standing due to pain.   Ambulation/Gait Ambulation/Gait assistance: Supervision Gait Distance (Feet): 120 Feet Assistive device: Rolling walker (2 wheeled) Gait Pattern/deviations: Step-to pattern;Decreased step length - left;Decreased stance time - left;Antalgic;Trunk flexed Gait velocity: very decr    General Gait Details: supervision for safety, VCs  posture   Stairs Stairs: Yes Stairs assistance: Min assist Stair Management: One rail Left;Forwards Number of Stairs: 9 General stair comments: 3 stairs x 3 trials, min HHA from spouse on R side (pt declined cane/crutch, stated she's more comfortable with spouse helping), VCs sequencing   Wheelchair Mobility    Modified Rankin (Stroke Patients Only)       Balance Overall balance assessment: Mild deficits observed, not formally tested                                          Cognition Arousal/Alertness: Awake/alert Behavior During Therapy: WFL for tasks assessed/performed Overall Cognitive Status: Within Functional Limits for tasks assessed                                        Exercises Total Joint Exercises Ankle Circles/Pumps: Both;AROM;10 reps Quad Sets: AROM;Left;Supine;5 reps Short Arc Quad: AROM;Left;10 reps;Supine Heel Slides: AAROM;Left;10 reps;Supine Hip ABduction/ADduction: AAROM;Left;10 reps;Supine    General Comments        Pertinent Vitals/Pain Pain Score: 8  Pain Location: L hip, getting on and off toilet Pain Descriptors / Indicators: Sore Pain Intervention(s): Limited activity within patient's tolerance;Monitored during session;Premedicated before session;Ice applied    Home Living                      Prior Function            PT Goals (current goals can now be found in the care plan  section) Acute Rehab PT Goals Patient Stated Goal: walk dog without pain  PT Goal Formulation: With patient Time For Goal Achievement: 08/30/18 Potential to Achieve Goals: Good Progress towards PT goals: Progressing toward goals    Frequency    7X/week      PT Plan Current plan remains appropriate    Co-evaluation              AM-PAC PT "6 Clicks" Mobility   Outcome Measure  Help needed turning from your back to your side while in a flat bed without using bedrails?: A Little Help needed moving  from lying on your back to sitting on the side of a flat bed without using bedrails?: A Little Help needed moving to and from a bed to a chair (including a wheelchair)?: A Little Help needed standing up from a chair using your arms (e.g., wheelchair or bedside chair)?: A Little Help needed to walk in hospital room?: A Little Help needed climbing 3-5 steps with a railing? : A Lot 6 Click Score: 17    End of Session Equipment Utilized During Treatment: Gait belt Activity Tolerance: Patient tolerated treatment well;Patient limited by pain Patient left: in bed;with bed alarm set;with family/visitor present;with call bell/phone within reach Nurse Communication: Mobility status PT Visit Diagnosis: Other abnormalities of gait and mobility (R26.89);Difficulty in walking, not elsewhere classified (R26.2)     Time: 1657-9038 PT Time Calculation (min) (ACUTE ONLY): 45 min  Charges:  $Gait Training: 8-22 mins $Therapeutic Exercise: 8-22 mins $Therapeutic Activity: 8-22 mins                     Ralene Bathe Kistler PT 08/24/2018  Acute Rehabilitation Services Pager [phone] Office [phone]

## 2018-08-24 NOTE — TOC Transition Note (Signed)
Transition of Care [facility]) - CM/SW Discharge Note   Patient Details  Name: Patricia Mclean MRN: [id number] Date of Birth: 1970-12-02  [facility] of [facility] CM/SW Contact:  Lanier Clam, RN Phone Number: 08/24/2018, 1:05 PM   Clinical Narrative:  Patient awating rw,3n1 to be delivered by [employer] Fayrene Fearing aware to deliver to rm prior dc. No further CM needs.     Final next level of care: OP Rehab Barriers to Discharge: No Barriers Identified   Patient Goals and CMS Choice        Discharge Placement                       Discharge Plan and Services                        Social Determinants of Health (SDOH) Interventions     Readmission Risk Interventions No flowsheet data found.

## 2018-08-24 NOTE — Progress Notes (Signed)
Pt stable at this time. No questions or complications at time d/c. Pt has all home equipment needed.

## 2018-08-24 NOTE — Plan of Care (Signed)
  Problem: Activity: Goal: Ability to avoid complications of mobility impairment will improve 08/24/2018 0337 by Lovenia Shuck, RN Outcome: Progressing Problem: Pain Managment: Goal: General experience of comfort will improve 08/24/2018 0337 by Lovenia Shuck, RN Problem: Safety: Goal: Ability to remain free from injury will improve 08/24/2018 0337 by Lovenia Shuck, RN Outcome: Progressing

## 2018-08-26 ENCOUNTER — Encounter (HOSPITAL_COMMUNITY): Payer: Self-pay | Admitting: Orthopedic Surgery

## 2018-12-24 ENCOUNTER — Encounter: Payer: Self-pay | Admitting: Physical Medicine and Rehabilitation

## 2019-06-02 ENCOUNTER — Ambulatory Visit (INDEPENDENT_AMBULATORY_CARE_PROVIDER_SITE_OTHER): Payer: PRIVATE HEALTH INSURANCE

## 2019-06-02 ENCOUNTER — Encounter: Payer: Self-pay | Admitting: Physical Medicine and Rehabilitation

## 2019-06-02 ENCOUNTER — Ambulatory Visit (INDEPENDENT_AMBULATORY_CARE_PROVIDER_SITE_OTHER): Payer: PRIVATE HEALTH INSURANCE | Admitting: Physical Medicine and Rehabilitation

## 2019-06-02 ENCOUNTER — Other Ambulatory Visit: Payer: Self-pay

## 2019-06-02 DIAGNOSIS — M5416 Radiculopathy, lumbar region: Secondary | ICD-10-CM

## 2019-06-02 DIAGNOSIS — M25552 Pain in left hip: Secondary | ICD-10-CM

## 2019-06-02 DIAGNOSIS — M47816 Spondylosis without myelopathy or radiculopathy, lumbar region: Secondary | ICD-10-CM | POA: Diagnosis not present

## 2019-06-02 DIAGNOSIS — R202 Paresthesia of skin: Secondary | ICD-10-CM

## 2019-06-02 DIAGNOSIS — Z96642 Presence of left artificial hip joint: Secondary | ICD-10-CM

## 2019-06-02 NOTE — Progress Notes (Signed)
 .  Numeric Pain Rating Scale and Functional Assessment Average Pain 7   In the last MONTH (on 0-10 scale) has pain interfered with the following?  1. General activity like being  able to carry out your everyday physical activities such as walking, climbing stairs, carrying groceries, or moving a chair?  Rating(8)     

## 2019-06-10 ENCOUNTER — Ambulatory Visit: Payer: Self-pay

## 2019-06-10 ENCOUNTER — Ambulatory Visit (INDEPENDENT_AMBULATORY_CARE_PROVIDER_SITE_OTHER): Payer: PRIVATE HEALTH INSURANCE | Admitting: Physical Medicine and Rehabilitation

## 2019-06-10 ENCOUNTER — Other Ambulatory Visit: Payer: Self-pay

## 2019-06-10 ENCOUNTER — Encounter: Payer: Self-pay | Admitting: Physical Medicine and Rehabilitation

## 2019-06-10 DIAGNOSIS — M25552 Pain in left hip: Secondary | ICD-10-CM

## 2019-06-10 NOTE — Progress Notes (Signed)
 .  Numeric Pain Rating Scale and Functional Assessment Average Pain 8   In the last MONTH (on 0-10 scale) has pain interfered with the following?  1. General activity like being  able to carry out your everyday physical activities such as walking, climbing stairs, carrying groceries, or moving a chair?  Rating(8)    -Dye Allergies.  

## 2019-06-15 ENCOUNTER — Other Ambulatory Visit: Payer: Self-pay

## 2019-06-15 ENCOUNTER — Encounter: Payer: Self-pay | Admitting: Physical Medicine and Rehabilitation

## 2019-06-15 ENCOUNTER — Ambulatory Visit (INDEPENDENT_AMBULATORY_CARE_PROVIDER_SITE_OTHER): Payer: PRIVATE HEALTH INSURANCE

## 2019-06-15 ENCOUNTER — Other Ambulatory Visit: Payer: Self-pay | Admitting: Orthopaedic Surgery

## 2019-06-15 ENCOUNTER — Ambulatory Visit: Payer: PRIVATE HEALTH INSURANCE | Admitting: Orthopaedic Surgery

## 2019-06-15 DIAGNOSIS — M25552 Pain in left hip: Secondary | ICD-10-CM

## 2019-06-15 DIAGNOSIS — M7062 Trochanteric bursitis, left hip: Secondary | ICD-10-CM | POA: Diagnosis not present

## 2019-06-15 DIAGNOSIS — Z96642 Presence of left artificial hip joint: Secondary | ICD-10-CM | POA: Diagnosis not present

## 2019-06-15 MED ORDER — TRIAMCINOLONE ACETONIDE 40 MG/ML IJ SUSP
40.0000 mg | INTRAMUSCULAR | Status: AC | PRN
  Administered 2019-06-15: 40 mg via INTRA_ARTICULAR

## 2019-06-15 MED ORDER — LIDOCAINE HCL 2 % IJ SOLN
4.0000 mL | INTRAMUSCULAR | Status: AC | PRN
  Administered 2019-06-15: 4 mL

## 2019-06-15 MED ORDER — METHYLPREDNISOLONE 4 MG PO TABS: ORAL_TABLET | ORAL | 0 refills | Status: DC

## 2019-06-15 MED ORDER — BUPIVACAINE HCL 0.25 % IJ SOLN
4.0000 mL | INTRAMUSCULAR | Status: AC | PRN
  Administered 2019-06-15: 4 mL via INTRA_ARTICULAR

## 2019-06-15 MED ORDER — CELECOXIB 200 MG PO CAPS: 200.0000 mg | ORAL_CAPSULE | Freq: Two times a day (BID) | ORAL | 1 refills | Status: DC | PRN

## 2019-06-15 NOTE — Progress Notes (Signed)
Patricia Mclean - 49 y.o. female MRN [id number]  Date of birth: 05-13-1971  Office Visit Note: Visit Date: 06/02/2019 PCP: Mosetta Putt, MD Referred by: Mosetta Putt, MD  Subjective: Chief Complaint  Patient presents with  . Left Hip - Pain  . Left Thigh - Pain, Numbness   HPI: Patricia Mclean is a 49 y.o. female who comes in today Valuation in May judgment of chronic worsening left lateral hip pain at the request of Dr. Mosetta Putt.  Per Dr. Geoffery Lyons notes and the patient's report today she began having a lot of worsening hip pain at the beginning of 2020 without specific trauma.  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  Initially she did conservative care with medication management and activity modification and then really no relief.  X-ray of the hip was obtained in January and this report is in the chart it did not really show any degenerative change of the hip or any worrisome signs.  She did obtain intra-articular hip injection as well as greater trochanteric injection under fluoroscopic guidance.  This was done July 18, 2018 at [facility] imaging by Dr. Benard Rink at the request of Dr. Duaine Dredge.  She reports that she felt like this did give her some relief at the time but the symptoms returned fairly quickly.  She noted that she had more pain with ambulation and weightbearing.  She says she got to the point where she was walking with a significant limp.  She demonstrates to me in the office what appeared to be almost a Trendelenburg sign.  She did not note any paresthesias or focal lower extremity weakness at the time.  She does not endorse any real history of lumbar spine problems no lumbar spine surgery.  She says at one point she was evaluated for her low back.  I did find an old MRI from 2001 by Dr. Terrilee Croak.  Nonetheless she ended up seeing Dr. Jodi Geralds at [facility] orthopedics for  evaluation.  MRI of the hip at that time did show significant avascular necrosis.  That MRI is reviewed below.  She actually had some avascular necrosis of the right hip as well.  According to her she was scheduled almost for immediate left total hip arthroplasty.  She reports the surgery was completed on August 22, 2018 but she has not really had any significant relief of pain since that time.  She reports she is not in quite as much pain as she was prior to surgery but it is pretty close.  She reports lateral hip pain with weightbearing and abduction.  She reports that a blind greater trochanteric injection was performed by Dr. Luiz Blare without much relief at all.  She has been through physical therapy without much relief.  She was weaned from oxycodone to hydrocodone to tramadol post surgery.  She has an underlying diagnosis of anxiety and does take Xanax.  This is managed by Dr. Duaine Dredge.  She currently takes Tylenol, gabapentin and tizanidine.  She has not had any work-up from her lumbar spine standpoint without any current x-rays.  She is very tearful today in the office because this is really challenging her at work and she is very frustrated with the fact that she underwent these procedures without much relief at all.  She rates her pain as a 7 out of 10 but rates the reduction in normal activities as an 8 out of 10.  She is  currently using a cane for ambulation.   Review of Systems  Constitutional: Negative for chills, fever, malaise/fatigue and weight loss.  HENT: Negative for hearing loss and sinus pain.   Eyes: Negative for blurred vision, double vision and photophobia.  Respiratory: Negative for cough and shortness of breath.   Cardiovascular: Negative for chest pain, palpitations and leg swelling.  Gastrointestinal: Negative for abdominal pain, nausea and vomiting.  Genitourinary: Negative for flank pain.  Musculoskeletal: Positive for back pain and joint pain. Negative for myalgias.  Skin:  Negative for itching and rash.  Neurological: Positive for tingling. Negative for tremors, focal weakness and weakness.  Endo/Heme/Allergies: Negative.   Psychiatric/Behavioral: Positive for depression. The patient is nervous/anxious.   All other systems reviewed and are negative.  Otherwise per HPI.  Assessment & Plan: Visit Diagnoses:  1. Lumbar spondylosis   2. Radiculopathy, lumbar region   3. History of total left hip arthroplasty   4. Pain in left hip   5. Paresthesia of skin     Plan: Findings:  Continued left lateral predominant hip pain over the greater trochanteric region but also somewhat posterior somewhat anterior.  She has some sensitivity over the incision site which could be still some form of mild reflex sympathetic dystrophy versus lateral femoral cutaneous nerve.  Her biggest issue is not that paresthesia tingling it is the pain that she gets with weightbearing.  Exam does not show any pain with hip rotation although she is very apprehensive about moving the hip.  She does have a lot of pain with going to sit to stand and it is with weightbearing it is lateral.  She ambulates with an antalgic gait.  At this point I think the first step is to have a second opinion of the hip prosthesis itself and review of the hip MRI that was done prior to the surgery.  I want her to see Dr. Doneen Poisson in our office for a second opinion and she does want to do that.  Secondly since she did have a greater trochanteric injection with fluoroscopic guidance in the past and that seemed to help to a degree I like to have her come back and try that here in the office and we will get that scheduled.  We talked at great length about pain management strategies and psychological support.  We talked about topical medications over the trochanter and around the scar.  This would include Voltaren gel which is over-the-counter.  She is going to try this 3 times a day.  Depending on that would also look  at Lidoderm patches or some topical.  We will see her back for the injection and I think she would be wise to follow Dr. Eliberto Ivory recommendations for the hip as well.  Lastly she does have an area of the lumbar spine on x-ray that was done today that shows transitional segment with pseudojoint formation on the left side and that could cause some pain with weightbearing.  Depending on where she ends up we could look at diagnostic injection of that area versus MRI of the lumbar spine.    Meds & Orders: No orders of the defined types were placed in this encounter.   Orders Placed This Encounter  Procedures  . XR Lumbar Spine Complete    Follow-up: Return for Left greater trochanteric injection under fluoroscopic guidance.   Procedures: No procedures performed  No notes on file   Clinical History: MR OF THE LEFT HIP WITHOUT CONTRAST  TECHNIQUE: Multiplanar,  multisequence MR imaging was performed. No intravenous contrast was administered.  COMPARISON: None.  FINDINGS: Bones: Signal changes within both femoral heads, consistent with avascular necrosis. Prominent reactive marrow edema in the left femoral head and neck. There is slight collapse of the left femoral head anteriorly. No right femoral head collapse. No acute fracture or dislocation. Marrow edema in the left acetabulum is likely stress related. No focal bone lesion. The visualized sacroiliac joints and symphysis pubis appear normal.  Articular cartilage and labrum  Articular cartilage: High-grade near full-thickness cartilage loss in the right anterior superior hip joint.  Labrum: Grossly intact, although evaluation is limited due to lack of intra-articular fluid. No paralabral abnormality.  Joint or bursal effusion  Joint effusion: Small left hip joint effusion.  Bursae: No focal periarticular fluid collection.  Muscles and tendons  Muscles and tendons: The visualized gluteus, hamstring and iliopsoas tendons  appear normal. No muscle edema or atrophy.  Other findings  Miscellaneous: The visualized internal pelvic contents appear unremarkable.  IMPRESSION: 1. Avascular necrosis of the left femoral head with slight subchondral collapse anteriorly. Prominent reactive marrow edema in the femoral head and neck. High-grade cartilage loss along the left anterior superior hip joint. 2. Avascular necrosis of the right femoral head without subchondral collapse.   Electronically Signed  By: Titus Dubin M.D.  On: 08/17/2018 16:48   She reports that she has never smoked. She has never used smokeless tobacco. No results for input(s): HGBA1C, LABURIC in the last 8760 hours.  Objective:  VS:  HT:    WT:   BMI:     BP:   HR: bpm  TEMP: ( )  RESP:  Physical Exam Vitals and nursing note reviewed.  Constitutional:      General: She is not in acute distress.    Appearance: Normal appearance. She is well-developed. She is obese.  HENT:     Head: Normocephalic and atraumatic.     Nose: Nose normal.     Mouth/Throat:     Mouth: Mucous membranes are moist.     Pharynx: Oropharynx is clear.  Eyes:     Conjunctiva/sclera: Conjunctivae normal.     Pupils: Pupils are equal, round, and reactive to light.  Cardiovascular:     Rate and Rhythm: Regular rhythm.  Pulmonary:     Effort: Pulmonary effort is normal. No respiratory distress.  Abdominal:     General: There is no distension.     Palpations: Abdomen is soft.     Tenderness: There is no guarding.  Musculoskeletal:        General: Tenderness present.     Cervical back: Normal range of motion and neck supple.     Right lower leg: No edema.     Left lower leg: No edema.     Comments: Examination shows patient ambulates with antalgic gait to the left using a cane.  She has pain with palpating over the lower lumbar region but also greater trochanter is exquisitely painful.  No real pain on the right.  Hip seems to range pretty well but she is  apprehensive with movement.  She does have some paresthesia around the and surgical incision.  It does look well-healed.  There is no focal lower extremity deficits.  Skin:    General: Skin is warm and dry.     Findings: No erythema or rash.  Neurological:     General: No focal deficit present.     Mental Status: She is alert and oriented  to person, place, and time.     Sensory: Sensory deficit present.     Motor: No abnormal muscle tone.     Coordination: Coordination normal.     Gait: Gait abnormal.  Psychiatric:        Behavior: Behavior normal.        Thought Content: Thought content normal.     Comments: Tearful in the office     Ortho Exam Imaging: XR Lumbar Spine Complete  Result Date: 06/02/2019 4 view lumbar spine films show very mild rotational scoliosis centered at L2-3 to the right with transitional L5 segment with a left sided sacralization and likely pseudojoint to the sacral ala.  There is facet arthropathy at L5-S1 with some foraminal narrowing bilaterally.  Hip heights seen equal as well as pelvic heights.  Left-sided arthroplasty is seated.  Right hip appears to have very minimal degenerative change.  There is no spondylolysis or listhesis.   Past Medical/Family/Surgical/Social History: Medications & Allergies reviewed per EMR, new medications updated. Patient Active Problem List   Diagnosis Date Noted  . Avascular necrosis of hip, left (HCC) 08/22/2018  . Avascular necrosis of femoral head, left (HCC) 08/22/2018   Past Medical History:  Diagnosis Date  . Anxiety   . GERD (gastroesophageal reflux disease)   . Headache    metoprolol and topamx  . Pneumonia    x3 had vaccine   History reviewed. No pertinent family history. Past Surgical History:  Procedure Laterality Date  . TONSILLECTOMY    . TOTAL HIP ARTHROPLASTY Left 08/22/2018   Procedure: TOTAL HIP ARTHROPLASTY ANTERIOR APPROACH;  Surgeon: Jodi Geralds, MD;  Location: [facility] ORS;  Service: Orthopedics;   Laterality: Left;   Social History   Occupational History  . Not on file  Tobacco Use  . Smoking status: Never Smoker  . Smokeless tobacco: Never Used  Substance and Sexual Activity  . Alcohol use: Yes    Alcohol/week: 1.0 standard drinks    Types: 1 Glasses of wine per week    Comment: occasional  wine with dinner  . Drug use: Never  . Sexual activity: Yes

## 2019-06-15 NOTE — Progress Notes (Signed)
   Patricia Mclean - 49 y.o. female MRN [id number]  Date of birth: [birth year]  Office Visit Note: Visit Date: 06/10/2019 PCP: Mosetta Putt, MD Referred by: Mosetta Putt, MD  Subjective: Chief Complaint  Patient presents with  . Left Hip - Pain   HPI:  Patricia Mclean is a 49 y.o. female who comes in today For planned diagnostic and for therapeutic left greater trochanteric bursa injection using fluoroscopic guidance.  Please see our prior evaluation and management note for further details and justification.  ROS Otherwise per HPI.  Assessment & Plan: Visit Diagnoses:  1. Pain in left hip     Plan: Findings:  Seem to have some initial pain relief during anesthetic phase.    Meds & Orders: No orders of the defined types were placed in this encounter.   Orders Placed This Encounter  Procedures  . Large Joint Inj  . XR C-ARM NO REPORT    Follow-up: No follow-ups on file.   Procedures: Large Joint Inj: L greater trochanter on 06/15/2019 6:15 AM Indications: pain and diagnostic evaluation Details: 22 G 3.5 in needle, fluoroscopy-guided lateral approach  Arthrogram: No  Medications: 4 mL lidocaine 2 %; 4 mL bupivacaine 0.25 %; 40 mg triamcinolone acetonide 40 MG/ML Outcome: tolerated well, no immediate complications  There was excellent flow of contrast outlined the greater trochanteric bursa without vascular uptake. Procedure, treatment alternatives, risks and benefits explained, specific risks discussed. Consent was given by the patient. Immediately prior to procedure a time out was called to verify the correct patient, procedure, equipment, support staff and site/side marked as required. Patient was prepped and draped in the usual sterile fashion.      No notes on file   Clinical History: MR OF THE LEFT HIP WITHOUT CONTRAST  TECHNIQUE: Multiplanar, multisequence MR imaging was performed. No intravenous contrast was administered.  COMPARISON:  None.  FINDINGS: Bones: Signal changes within both femoral heads, consistent with avascular necrosis. Prominent reactive marrow edema in the left femoral head and neck. There is slight collapse of the left femoral head anteriorly. No right femoral head collapse. No acute fracture or dislocation. Marrow edema in the left acetabulum is likely stress related. No focal bone lesion. The visualized sacroiliac joints and symphysis pubis appear normal.  Articular cartilage and labrum  Articular cartilage: High-grade near full-thickness cartilage loss in the right anterior superior hip joint.  Labrum: Grossly intact, although evaluation is limited due to lack of intra-articular fluid. No paralabral abnormality.  Joint or bursal effusion  Joint effusion: Small left hip joint effusion.  Bursae: No focal periarticular fluid collection.  Muscles and tendons  Muscles and tendons: The visualized gluteus, hamstring and iliopsoas tendons appear normal. No muscle edema or atrophy.  Other findings  Miscellaneous: The visualized internal pelvic contents appear unremarkable.  IMPRESSION: 1. Avascular necrosis of the left femoral head with slight subchondral collapse anteriorly. Prominent reactive marrow edema in the femoral head and neck. High-grade cartilage loss along the left anterior superior hip joint. 2. Avascular necrosis of the right femoral head without subchondral collapse.   Electronically Signed  By: Obie Dredge M.D.  On: 08/17/2018 16:48     Objective:  VS:  HT:    WT:   BMI:     BP:   HR: bpm  TEMP: ( )  RESP:  Physical Exam  Ortho Exam Imaging: No results found.

## 2019-06-15 NOTE — Progress Notes (Signed)
Office Visit Note   Patient: Patricia Mclean           Date of Birth: 08-23-1970           MRN: [id number] Visit Date: 06/15/2019              Requested by: Derinda Late, MD [address] [place],  [place] [address] PCP: Derinda Late, MD   Assessment & Plan: Visit Diagnoses:  1. Pain in left hip   2. History of total left hip replacement   3. Trochanteric bursitis, left hip     Plan: I do feel that she has severe trochanteric bursitis.  I am encouraged by the fact that the steroid injection has helped some over her left hip the Dr. Ernestina Patches provided last week.  I would like her to try to desensitize this area with massage and Voltaren gel 2-3 times a day of the trochanteric area.  I want to have her try a steroid taper and will follow this with Celebrex twice a day which can to be easier on the stomach.  I would have her stop her gabapentin.  I also want her to try and insert in just her left shoe to see how this affects her gait and her balance because even though she has no balance issues this may help even her out they can take pressure off the trochanteric area where she has developed this bursitis.  We had a long and thorough discussion about this.  I would like to see her back in 6 weeks because even at that visit I may consider repeat injection.  I also showed her stretching exercises to try.  We had a long and thorough discussion about her hip.  All question concerns were answered and addressed.  Follow-Up Instructions: Return in about 6 weeks (around 07/27/2019).   Orders:  Orders Placed This Encounter  Procedures  . XR HIP UNILAT W OR W/O PELVIS 1V LEFT   Meds ordered this encounter  Medications  . methylPREDNISolone (MEDROL) 4 MG tablet    Sig: Medrol dose pack. Take as instructed    Dispense:  21 tablet    Refill:  0  . celecoxib (CELEBREX) 200 MG capsule    Sig: Take 1 capsule (200 mg total) by mouth 2 (two) times daily between meals as needed.   Dispense:  60 capsule    Refill:  1      Procedures: No procedures performed   Clinical Data: No additional findings.   Subjective: Chief Complaint  Patient presents with  . Left Hip - Pain  The patient is a very pleasant 49 year old female who comes in for second opinion as it relates to left hip pain.  She had a left total hip arthroplasty done 9 months ago through direct anterior approach.  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  She actually has a photograph of the femoral head and she did have severe avascular crest with femoral head collapse.  I was actually able to review the report of her MRI that she had prior to that and the edema in her femoral head and into the femoral neck was quite extensive.  There is a nidus of AVN and some arthritis of the right hip but she is asymptomatic on the right hip.  She reports severe left hip pain.  She has been on gabapentin 300 mg at night.  She has taken limited anti-inflammatories because it does cause some gastritis.  She is tried Voltaren gel.  She has been to months of physical therapy and she still has pain mainly over the lateral aspect of her left hip.  It does radiate down the IT band.  She did actually see Dr. Alvester Morin last week in the office and on December 30 he provided a steroid injection of the trochanteric area which she did say helped significantly.  She cannot lay on that side at night.  She feels like she is gaining weight due to the inability to exercise.  Her only exercise right now is walking.  She denies any groin pain.  She denies any known leg length discrepancy.  She states that leading up to surgery her gait had gotten so bad that she could not walk well and she is leaning significantly to the left side.  She is not a diabetic and not a smoker. She works for [employer] HPI  Review of Systems She currently denies any headache, chest pain, shortness of breath, fever, chills, nausea,  vomiting  Objective: Vital Signs: There were no vitals taken for this visit.  Physical Exam She is alert and orient x3 and in no acute distress she does not seem to walk with any significant limp. Ortho Exam On examination both hips move smoothly and fluidly.  I had her lay supine and when I assessed her leg lengths I feel that she is just slightly shorter on the left operative side than the right but it is not significant.  Her right hip exam shows no pain on compression.  When I had her lay in a decubitus position with her left hip up she has severe pain to palpation of the trochanteric area and the IT band.  There is no evidence of neuroma around the lateral femoral cutaneous nerve. Specialty Comments:  No specialty comments available.  Imaging: XR Lumbar Spine Complete  Result Date: 06/15/2019 4 view lumbar spine films show very mild rotational scoliosis centered at L2-3 to the right with transitional L5 segment with a left sided sacralization and likely pseudojoint to the sacral ala.  There is facet arthropathy at L5-S1 with some foraminal narrowing bilaterally.  Hip heights seen equal as well as pelvic heights.  Left-sided arthroplasty is seated.  Right hip appears to have very minimal degenerative change.  There is no spondylolysis or listhesis.    PMFS History: Patient Active Problem List   Diagnosis Date Noted  . Avascular necrosis of hip, left ([facility]) 08/22/2018  . Avascular necrosis of femoral head, left ([facility]) 08/22/2018   Past Medical History:  Diagnosis Date  . Anxiety   . GERD (gastroesophageal reflux disease)   . Headache    metoprolol and topamx  . Pneumonia    x3 had vaccine    No family history on file.  Past Surgical History:  Procedure Laterality Date  . TONSILLECTOMY    . TOTAL HIP ARTHROPLASTY Left 08/22/2018   Procedure: TOTAL HIP ARTHROPLASTY ANTERIOR APPROACH;  Surgeon: Jodi Geralds, MD;  Location: [facility];  Service: Orthopedics;  Laterality: Left;    Social History   Occupational History  . Not on file  Tobacco Use  . Smoking status: Never Smoker  . Smokeless tobacco: Never Used  Substance and Sexual Activity  . Alcohol use: Yes    Alcohol/week: 1.0 standard drinks    Types: 1 Glasses of wine per week    Comment: occasional  wine with dinner  . Drug use: Never  . Sexual activity: Yes

## 2019-06-16 NOTE — Telephone Encounter (Signed)
Ok to fill 

## 2019-07-27 ENCOUNTER — Other Ambulatory Visit: Payer: Self-pay

## 2019-07-27 ENCOUNTER — Encounter: Payer: Self-pay | Admitting: Orthopaedic Surgery

## 2019-07-27 ENCOUNTER — Ambulatory Visit (INDEPENDENT_AMBULATORY_CARE_PROVIDER_SITE_OTHER): Payer: PRIVATE HEALTH INSURANCE | Admitting: Orthopaedic Surgery

## 2019-07-27 DIAGNOSIS — M7062 Trochanteric bursitis, left hip: Secondary | ICD-10-CM

## 2019-07-27 DIAGNOSIS — Z96642 Presence of left artificial hip joint: Secondary | ICD-10-CM | POA: Diagnosis not present

## 2019-07-27 MED ORDER — LIDOCAINE HCL 1 % IJ SOLN
3.0000 mL | INTRAMUSCULAR | Status: AC | PRN
  Administered 2019-07-27: 3 mL

## 2019-07-27 MED ORDER — METHYLPREDNISOLONE ACETATE 40 MG/ML IJ SUSP
40.0000 mg | INTRAMUSCULAR | Status: AC | PRN
  Administered 2019-07-27: 08:00:00 40 mg via INTRA_ARTICULAR

## 2019-07-27 NOTE — Progress Notes (Signed)
Office Visit Note   Patient: Patricia Mclean           Date of Birth: May 16, 1971           MRN: [id number] Visit Date: 07/27/2019              Requested by: Derinda Late, MD [address] ,  [place] [address] PCP: Derinda Late, MD   Assessment & Plan: Visit Diagnoses:  1. Trochanteric bursitis, left hip   2. History of total left hip replacement     Plan: Since she is not a diabetic I feel that is reasonable to try a trochanteric injection once more over her left hip area.  She agrees with this treatment plan and tolerated it well.  All question concerns were answered and addressed.  At this point follow-up can be as needed.  Certainly repeat injection can be provided if needed and 3 months from now.  Follow-Up Instructions: Return if symptoms worsen or fail to improve.   Orders:  Orders Placed This Encounter  Procedures  . Large Joint Inj   No orders of the defined types were placed in this encounter.     Procedures: Large Joint Inj: L greater trochanter on 07/27/2019 8:23 AM Indications: pain and diagnostic evaluation Details: 22 G 1.5 in needle, lateral approach  Arthrogram: No  Medications: 3 mL lidocaine 1 %; 40 mg methylPREDNISolone acetate 40 MG/ML Outcome: tolerated well, no immediate complications Procedure, treatment alternatives, risks and benefits explained, specific risks discussed. Consent was given by the patient. Immediately prior to procedure a time out was called to verify the correct patient, procedure, equipment, support staff and site/side marked as required. Patient was prepped and draped in the usual sterile fashion.       Clinical Data: No additional findings.   Subjective: Chief Complaint  Patient presents with  . Left Hip - Follow-up  The patient comes in with continued trochanteric bursitis of her left hip.  She has a history of a left total hip arthroplasty done by one of my colleagues in town.  Back in December  she did have a trochanteric injection with a steroid by Dr. Ernestina Patches.  She says that is helped quite a bit and she is trying to desensitize the area with massage and Voltaren gel.  She still has some pain going up and down steps but her general walking is improved.  She does report pain still on the lateral aspect of her hip when she lays on that side.  HPI  Review of Systems She currently denies any headache, chest pain, shortness of breath, fever, chills, nausea, vomiting  Objective: Vital Signs: There were no vitals taken for this visit.  Physical Exam She is alert and orient x3 and in no acute distress Ortho Exam On examination her left operative hip moves smoothly.  She has significant pain to palpation over the trochanteric area and the proximal IT band. Specialty Comments:  No specialty comments available.  Imaging: No results found.   PMFS History: Patient Active Problem List   Diagnosis Date Noted  . Avascular necrosis of hip, left ([facility]) 08/22/2018  . Avascular necrosis of femoral head, left ([facility]) 08/22/2018   Past Medical History:  Diagnosis Date  . Anxiety   . GERD (gastroesophageal reflux disease)   . Headache    metoprolol and topamx  . Pneumonia    x3 had vaccine    History reviewed. No pertinent family history.  Past Surgical History:  Procedure  Laterality Date  . TONSILLECTOMY    . TOTAL HIP ARTHROPLASTY Left 08/22/2018   Procedure: TOTAL HIP ARTHROPLASTY ANTERIOR APPROACH;  Surgeon: Jodi Geralds, MD;  Location: [facility];  Service: Orthopedics;  Laterality: Left;   Social History   Occupational History  . Not on file  Tobacco Use  . Smoking status: Never Smoker  . Smokeless tobacco: Never Used  Substance and Sexual Activity  . Alcohol use: Yes    Alcohol/week: 1.0 standard drinks    Types: 1 Glasses of wine per week    Comment: occasional  wine with dinner  . Drug use: Never  . Sexual activity: Yes

## 2019-11-02 ENCOUNTER — Ambulatory Visit: Payer: PRIVATE HEALTH INSURANCE | Admitting: Orthopaedic Surgery

## 2019-11-02 ENCOUNTER — Other Ambulatory Visit: Payer: Self-pay

## 2019-11-02 ENCOUNTER — Encounter: Payer: Self-pay | Admitting: Orthopaedic Surgery

## 2019-11-02 DIAGNOSIS — M7062 Trochanteric bursitis, left hip: Secondary | ICD-10-CM

## 2019-11-02 DIAGNOSIS — Z96642 Presence of left artificial hip joint: Secondary | ICD-10-CM

## 2019-11-02 NOTE — Progress Notes (Signed)
The patient continues to be experiencing severe pain with her right hip.  She had a total hip arthroplasty by one of my colleagues in town in March 2020.  I felt that her issues are mainly trochanteric bursitis.  The pain is become severe again.  She did have success with at least a steroid injection in that hip.  On examination she is severely tender to palpation over the trochanteric area of her left hip.  The hip does move smoothly and fluidly but otherwise it is having significant and severe pain.  She is becoming depressed as a result of this and its detrimentally affecting her mobility and her quality of life.  At this point I would like to obtain an MRI of her left hip to rule out any issues with the prosthesis or fluid collections before making determination what the next up will be including the potential for even a bursectomy over the trochanteric area and a Z-lengthening of the IT band.  We will address this accordingly once we see her back from the MRI of her left hip.  Of note, she has failed conservative treatment with time, rest, activity modification, physical therapy, anti-inflammatories and steroid injections.

## 2019-11-03 ENCOUNTER — Other Ambulatory Visit: Payer: Self-pay | Admitting: Family Medicine

## 2019-11-03 ENCOUNTER — Ambulatory Visit
Admission: RE | Admit: 2019-11-03 | Discharge: 2019-11-03 | Disposition: A | Discharge status: Home / Self Care | Payer: PRIVATE HEALTH INSURANCE | Source: Ambulatory Visit | Attending: Family Medicine | Admitting: Family Medicine

## 2019-11-03 DIAGNOSIS — R059 Cough, unspecified: Secondary | ICD-10-CM

## 2019-11-15 ENCOUNTER — Ambulatory Visit
Admission: RE | Admit: 2019-11-15 | Discharge: 2019-11-15 | Disposition: A | Discharge status: Home / Self Care | Payer: PRIVATE HEALTH INSURANCE | Source: Ambulatory Visit | Attending: Orthopaedic Surgery | Admitting: Orthopaedic Surgery

## 2019-11-15 DIAGNOSIS — Z96642 Presence of left artificial hip joint: Secondary | ICD-10-CM

## 2019-11-30 ENCOUNTER — Ambulatory Visit: Payer: PRIVATE HEALTH INSURANCE | Admitting: Orthopaedic Surgery

## 2019-11-30 ENCOUNTER — Encounter: Payer: Self-pay | Admitting: Orthopaedic Surgery

## 2019-11-30 ENCOUNTER — Other Ambulatory Visit: Payer: Self-pay

## 2019-11-30 DIAGNOSIS — M7062 Trochanteric bursitis, left hip: Secondary | ICD-10-CM | POA: Diagnosis not present

## 2019-11-30 DIAGNOSIS — Z96642 Presence of left artificial hip joint: Secondary | ICD-10-CM

## 2019-11-30 NOTE — Progress Notes (Signed)
The patient comes in today to go over an MRI of her left hip.  She had a left total hip arthroplasty done through an anterior approach over a year ago by one of my colleagues in town.  She is still being plagued by severe left hip pain over the trochanteric area.  It has improved on occasion with steroid injections around her left trochanteric area but then it comes back with severe pain.  She has had physical therapy but only with stretching and no hands-on formal PT in terms of dry needling or other modalities that can help try to get this better.  Her pain is only on the lateral aspect of her left hip.  I have provided at least 2 steroid injections in this area that did improve somewhat but again her pain came back severely.  A MRI of the left hip did not show any worrisome features around the trochanteric area or the implant itself.  I talked about the possibility of a trochanteric bursectomy and exploring this area but I do wish for her to try at least hands-on outpatient physical therapy first.  Her husband is with her today.  They both agree with this treatment plan for the short-term to see if this can help decrease her hip pain.  If not, our next step would be to pursue an operative intervention.  We will order outpatient physical therapy for dry needling as well as iontophoresis and phonophoresis and other modalities that can help hopefully desensitize this area and help her pain diminished.  All questions and concerns were answered and addressed.  I will see her back in 4 weeks after outpatient physical therapy at that [facility].

## 2019-12-01 ENCOUNTER — Other Ambulatory Visit: Payer: Self-pay

## 2019-12-01 DIAGNOSIS — M7062 Trochanteric bursitis, left hip: Secondary | ICD-10-CM

## 2019-12-08 ENCOUNTER — Ambulatory Visit: Payer: PRIVATE HEALTH INSURANCE | Admitting: Physical Therapy

## 2019-12-10 ENCOUNTER — Ambulatory Visit: Payer: No Typology Code available for payment source | Attending: Orthopaedic Surgery | Admitting: Physical Therapy

## 2019-12-10 ENCOUNTER — Encounter: Payer: Self-pay | Admitting: Physical Therapy

## 2019-12-10 ENCOUNTER — Other Ambulatory Visit: Payer: Self-pay

## 2019-12-10 DIAGNOSIS — M62838 Other muscle spasm: Secondary | ICD-10-CM | POA: Diagnosis present

## 2019-12-10 DIAGNOSIS — M25552 Pain in left hip: Secondary | ICD-10-CM | POA: Diagnosis not present

## 2019-12-10 DIAGNOSIS — M6281 Muscle weakness (generalized): Secondary | ICD-10-CM | POA: Insufficient documentation

## 2019-12-10 DIAGNOSIS — R262 Difficulty in walking, not elsewhere classified: Secondary | ICD-10-CM | POA: Diagnosis present

## 2019-12-10 DIAGNOSIS — R29898 Other symptoms and signs involving the musculoskeletal system: Secondary | ICD-10-CM | POA: Insufficient documentation

## 2019-12-10 NOTE — Therapy (Addendum)
[facility] [place] [address]  [address] [place], [place], [address] Phone: [phone]   Fax:  [phone]  Physical Therapy Evaluation  Patient Details  Name: Patricia EthStephanie O Walczyk MRN: [id number] Date of Birth: 07/18/1970 Referring Provider (PT): Kathryne Hitchhristopher Y Blackman, MD   Encounter Date: 12/10/2019   PT End of Session - 12/10/19 1705    Visit Number 1    Number of Visits 13    Date for PT Re-Evaluation 01/21/20    Authorization Type [employer] - VL: 45    PT Start Time 1705    PT Stop Time 1801    PT Time Calculation (min) 56 min    Activity Tolerance Patient tolerated treatment well;Patient limited by pain    Behavior During Therapy [facility] for tasks assessed/performed           Past Medical History:  Diagnosis Date  . Anxiety   . GERD (gastroesophageal reflux disease)   . Headache    metoprolol and topamx  . Pneumonia    x3 had vaccine    Past Surgical History:  Procedure Laterality Date  . TONSILLECTOMY    . TOTAL HIP ARTHROPLASTY Left 08/22/2018   Procedure: TOTAL HIP ARTHROPLASTY ANTERIOR APPROACH;  Surgeon: Jodi GeraldsGraves, John, MD;  Location: [facility];  Service: Orthopedics;  Laterality: Left;    There were no vitals filed for this visit.    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 Had ~3 visit of PT in person before clinic closed to COVID, then remainder of visits completed via Zoom. Pain originated fairly early on in rehab process but was told her hip was just still healing. Has had injections in bursa with some limited temporary relief but pain returns as injections wear off. Noted decrease in pain while on prednisone for migraines. Chronic pain has severely limited her activity tolerance, caused her to gain weight and has necessitated that she start taking meds for depression.    Pertinent History L THA 08/22/18 secondary to  AVN (Dr. Luiz BlareGraves)    Limitations Sitting;Standing;Walking    How long can you sit comfortably? 30 minutes    How long can you stand comfortably? 10 minutes    How long can you walk comfortably? always painful    Patient Stated Goals "to walk (on campus & with dog), do stairs & carry handbag w/o pain; sleep on side"    Currently in Pain? Yes    Pain Score 7    0/10 at rest while sitting   Pain Location Hip    Pain Orientation Left;Lateral    Pain Descriptors / Indicators Sharp;Dull;Throbbing    Pain Type Chronic pain    Pain Radiating Towards down to mid lateral ITB & up into TFL    Pain Onset Other (comment)   March 2020   Pain Frequency Intermittent    Aggravating Factors  pain initially upon standing and walking (worse after prolonged sitting); pressure if handbag bumps into hip; laying on L side    Pain Relieving Factors injections - temp relief but wears off, prednisone    Effect of Pain on Daily Activities limited walking tolerance - difficulty walking across campus; difficulty climbing stairs; unable to sleep on L side              [facility] - 12/10/19 1705      Assessment  Medical Diagnosis L hip trochanteric bursitis    Referring Provider (PT) Kathryne Hitch, MD    Onset Date/Surgical Date 08/22/18   L THR   Hand Dominance Right    Next MD Visit 01/07/20    Prior Therapy primarily virtual PT following THR      Precautions   Precautions None      Restrictions   Weight Bearing Restrictions No      Balance Screen   Has the patient fallen in the past 6 months No    Has the patient had a decrease in activity level because of a fear of falling?  No    Is the patient reluctant to leave their home because of a fear of falling?  No      Home Environment   Living Environment Private residence    Living Arrangements Spouse/significant other    Type of Home House    Home Access Stairs to enter    Home Layout One level      Prior Function   Level of  Independence Independent    Vocation Full time employment    Gaffer VP at [employer] traveling, walking the dog, cooking, had been working with Systems analyst prior to surgery      Cognition   Overall Cognitive Status Within Functional Limits for tasks assessed      ROM / Strength   AROM / PROM / Strength AROM;Strength      AROM   Overall AROM  Within functional limits for tasks performed;Deficits;Due to pain      Strength   Strength Assessment Site Hip;Knee    Right/Left Hip Right;Left    Right Hip Flexion 4+/5    Right Hip Extension 4+/5    Right Hip External Rotation  4+/5    Right Hip Internal Rotation 5/5    Right Hip ABduction 4/5    Right Hip ADduction 4+/5    Left Hip Flexion 4/5   discomfort   Left Hip Extension 4/5    Left Hip External Rotation 3+/5   pain   Left Hip Internal Rotation 4-/5   discomfort   Left Hip ABduction 4-/5   pain   Left Hip ADduction 4/5    Right/Left Knee Right;Left    Right Knee Flexion 5/5    Right Knee Extension 5/5    Left Knee Flexion 4+/5    Left Knee Extension 5/5      Flexibility   Soft Tissue Assessment /Muscle Length yes    Hamstrings mild tightness B    Quadriceps mild tightness quads & hip flexors L>R    ITB mod tight L, mild tight R    Piriformis mild tight B      Palpation   Palpation comment ttp over L greater trochanter, TLF, glutes, ITB and lateral quads      Special Tests    Special Tests Hip Special Tests    Hip Special Tests  Ober's Test      Ober's Test   Findings Positive    Side Left                      Objective measurements completed on examination: See above findings.       [facility] Adult PT Treatment/Exercise - 12/10/19 1705      Exercises   Exercises Knee/Hip      Knee/Hip Exercises: Stretches   Passive Hamstring Stretch Left;30 seconds;2 reps  Passive Hamstring Stretch Limitations supine with strap & seated hip hinge    Hip Flexor Stretch Limitations  attempted mod thomas but deferred d/t increased lateral hip pain    ITB Stretch Left;30 seconds;2 reps    ITB Stretch Limitations supine cross-body with strap & standing lateral lean at wall    Piriformis Stretch Left;30 seconds;1 rep    Piriformis Stretch Limitations supine KTOS      Modalities   Modalities Iontophoresis      Iontophoresis   Type of Iontophoresis Dexamethasone    Location L greater trochanter    Dose 80 mA-min, 1.0 mL    Time 4-6 hr patch (#1 of 6)                  PT Education - 12/10/19 1800    Education Details PT eval findings, anticipated POC, initial HEP, Ionto patch instructions    Person(s) Educated Patient    Methods Explanation;Demonstration;Verbal cues;Handout    Comprehension Verbalized understanding;Returned demonstration;Verbal cues required;Need further instruction            PT Short Term Goals - 12/10/19 1802      PT SHORT TERM GOAL #1   Title Patient will be independent with initial HEP    Status New    Target Date 12/31/19      PT SHORT TERM GOAL #2   Title Patient to report pain reduction in frequency and intensity by >/= 25%    Status New    Target Date 12/31/19             PT Long Term Goals - 12/10/19 1802      PT LONG TERM GOAL #1   Title Patient will be independent with ongoing/advanced HEP    Status New    Target Date 01/21/20      PT LONG TERM GOAL #2   Title Patient to report pain reduction in frequency and intensity by >/= 50%    Status New    Target Date 01/21/20      PT LONG TERM GOAL #3   Title Patient will demonstrate improved L hip strength to >/= 4+/5 for improved stability and ease of mobility    Status New    Target Date 01/21/20      PT LONG TERM GOAL #4   Title Patient will improve walking tolerance to >/= 30 minutes w/o pain interference to allow her to walk across campus or take her dog for a walk    Status New    Target Date 03/03/20      PT LONG TERM GOAL #5   Title Patient will  negotiate stairs reciprocally with normal step pattern w/o limitation due to L hip pain or weakness    Status New    Target Date 01/21/20                  Plan - 12/10/19 1802    Clinical Impression Statement Oma is a 49 y/o female who presents to OP PT for chronic L hip pain secondary to L trochanteric bursitis. She is s/p L THA on 08/22/18 for AVN of L hip. AVN also identified in R hip but has not yet caused her any pain. Rehab following THA interrupted due COVID-19 pandemic shut-down with majority of her PT completed virtually via Zoom. Pain originated relatively early on her rehab (had PT x ~6 months virtually) but was attributed to "ongoing healing" from THA. Pain currently localized to L lateral hip  with ttp over L greater trochanter & ITB as well as L TFL, lateral glutes, piriformis and distal lateral quad. Additional deficits include intermittent pain (up to 7/10), limited B proximal LE flexibility (L>R), and mild to moderate proximal LE weakness (L>R). Pain limits positional tolerance and most mobility, especially walking and climbing stairs. Lillion will benefit from skilled PT to address above deficits to improve proximal LE flexibility and decrease abnormal muscle tension to reduce or eliminate L hip pain and improve walking and activity tolerance.    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/18 secondary to AVN, R hip AVN, anxiety, depression    Examination-Activity Limitations Sit;Stand;Locomotion Level;Stairs;Transfers    Examination-Participation Restrictions Community Activity    Stability/Clinical Decision Making Stable/Uncomplicated    Clinical Decision Making Low    Rehab Potential Good    PT Frequency 2x / week    PT Duration 6 weeks    PT Treatment/Interventions ADLs/Self Care Home Management;Cryotherapy;Electrical Stimulation;Iontophoresis 4mg /ml  Dexamethasone;Moist Heat;Ultrasound;Gait training;Stair training;Functional mobility training;Therapeutic activities;Therapeutic exercise;Balance training;Neuromuscular re-education;Patient/family education;Manual techniques;Scar mobilization;Passive range of motion;Dry needling;Taping    PT Next Visit Plan Hip FOTO; assess repsonse to ionto patch; review initial HEP; manual therapy with potential DN to address abnormal muscle tension in TLF, glutes, quad and ITB; modalities including ionto patch as benefit noted    PT Home Exercise Plan 7/1 - HS, ITB, glute/piriformis stretches    Consulted and Agree with Plan of Care Patient           Patient will benefit from skilled therapeutic intervention in order to improve the following deficits and impairments:  Abnormal gait, Decreased activity tolerance, Decreased endurance, Decreased mobility, Decreased range of motion, Decreased scar mobility, Decreased strength, Difficulty walking, Increased fascial restricitons, Increased muscle spasms, Impaired flexibility, Pain  Visit Diagnosis: Pain in left hip - Plan: PT plan of care cert/re-cert  Difficulty in walking, not elsewhere classified - Plan: PT plan of care cert/re-cert  Other symptoms and signs involving the musculoskeletal system - Plan: PT plan of care cert/re-cert  Other muscle spasm - Plan: PT plan of care cert/re-cert  Muscle weakness (generalized) - Plan: PT plan of care cert/re-cert     Problem List Patient Active Problem List   Diagnosis Date Noted  . Avascular necrosis of hip, left (HCC) 08/22/2018  . Avascular necrosis of femoral head, left (HCC) 08/22/2018    08/24/2018, PT, MPT 12/10/2019, 8:02 PM  [facility] [address]  [address] [place], [place], [place] Phone: [phone]   Fax:  [phone]  Name: TRACIA LACOMB MRN: Patricia Mclean Date of Birth: Feb 13, 1971

## 2019-12-10 NOTE — Patient Instructions (Signed)
  Home exercise program created by Siddalee Vanderheiden, PT.  For questions, please contact Linzey Ramser via phone at [phone] or email at [email or web address]  [facility] [place] [address]  [address] [place], [place], [address] Phone: [phone]   Fax:  [phone]      IONTOPHORESIS PATIENT PRECAUTIONS & CONTRAINDICATIONS:  . Redness under one or both electrodes can occur.  This characterized by a uniform redness that usually disappears within 12 hours of treatment. . Small pinhead size blisters may result in response to the drug.  Contact your physician if the problem persists more than 24 hours. . On rare occasions, iontophoresis therapy can result in temporary skin reactions such as rash, inflammation, irritation or burns.  The skin reactions may be the result of individual sensitivity to the ionic solution used, the condition of the skin at the start of treatment, reaction to the materials in the electrodes, allergies or sensitivity to dexamethasone, or a poor connection between the patch and your skin.  Discontinue using iontophoresis if you have any of these reactions and report to your therapist. . Remove the Patch or electrodes if you have any undue sensation of pain or burning during the treatment and report discomfort to your therapist. . Tell your Therapist if you have had known adverse reactions to the application of electrical current. . Approximate treatment time is 4-6 hours.  Remove the patch after 6 hours. . The Patch can be worn during normal activity, however excessive motion where the electrodes have been placed can cause poor contact between the skin and the electrode or uneven electrical current resulting in greater risk of skin irritation. . Keep out of the reach of children.   . DO NOT use if you have a cardiac pacemaker or any other electrically sensitive implanted device. . DO NOT use if you have a known  sensitivity to dexamethasone. . DO NOT use during Magnetic Resonance Imaging (MRI). . DO NOT use over broken or compromised skin (e.g. sunburn, cuts, or acne) due to the increased risk of skin reaction. . DO NOT SHAVE over the area to be treated:  To establish good contact between the Patch and the skin, excessive hair may be clipped. . DO NOT place the Patch or electrodes on or over your eyes, directly over your heart, or brain. . DO NOT reuse the Patch or electrodes as this may cause burns to occur.   For questions, please contact your therapist at:  [facility] [place] [address]  [address] [place], [place], [address] Phone: [phone]   Fax:  [phone]  

## 2019-12-15 ENCOUNTER — Ambulatory Visit: Payer: No Typology Code available for payment source | Admitting: Physical Therapy

## 2019-12-15 ENCOUNTER — Encounter: Payer: Self-pay | Admitting: Physical Therapy

## 2019-12-15 ENCOUNTER — Other Ambulatory Visit: Payer: Self-pay

## 2019-12-15 DIAGNOSIS — R29898 Other symptoms and signs involving the musculoskeletal system: Secondary | ICD-10-CM

## 2019-12-15 DIAGNOSIS — M25552 Pain in left hip: Secondary | ICD-10-CM

## 2019-12-15 DIAGNOSIS — M6281 Muscle weakness (generalized): Secondary | ICD-10-CM

## 2019-12-15 DIAGNOSIS — R262 Difficulty in walking, not elsewhere classified: Secondary | ICD-10-CM

## 2019-12-15 DIAGNOSIS — M62838 Other muscle spasm: Secondary | ICD-10-CM

## 2019-12-15 NOTE — Patient Instructions (Signed)

## 2019-12-15 NOTE — Therapy (Signed)
[facility] [place] [address]  [place], [place], [address] Phone: [phone]   Fax:  [phone]  Physical Therapy Treatment  Patient Details  Name: Patricia Mclean MRN: [id number] Date of Birth: 12/28/1970 Referring Provider (PT): Mcarthur Rossetti, MD   Encounter Date: 12/15/2019   PT End of Session - 12/15/19 1057    Visit Number 2    Number of Visits 13    Date for PT Re-Evaluation 01/21/20    Authorization Type [employer] - VL: 31    PT Start Time 1057    PT Stop Time 5329    PT Time Calculation (min) 51 min    Activity Tolerance Patient tolerated treatment well    Behavior During Therapy [facility] for tasks assessed/performed           Past Medical History:  Diagnosis Date  . Anxiety   . GERD (gastroesophageal reflux disease)   . Headache    metoprolol and topamx  . Pneumonia    x3 had vaccine    Past Surgical History:  Procedure Laterality Date  . TONSILLECTOMY    . TOTAL HIP ARTHROPLASTY Left 08/22/2018   Procedure: TOTAL HIP ARTHROPLASTY ANTERIOR APPROACH;  Surgeon: Dorna Leitz, MD;  Location: [facility];  Service: Orthopedics;  Laterality: Left;    There were no vitals filed for this visit.   Subjective Assessment - 12/15/19 1101    Subjective Pt reports no reaction to ionto patch and feels that it has reduced her ttp. No issues with intial HEP.    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 Berenice Primas)    Patient Stated Goals "to walk (on campus & with dog), do stairs & carry handbag w/o pain; sleep on side"    Currently in Pain? Yes    Pain Score 4    4-5/10 with walking, 6/10 when bag hits hip, 8/10 with stairs   Pain Location Hip    Pain Orientation Left;Lateral    Pain Descriptors / Indicators Aching;Sharp   sharp on stairs   Pain Type Chronic pain    Pain Frequency Intermittent              [facility] PT Assessment - 12/15/19 1057      Observation/Other Assessments   Focus on  Therapeutic Outcomes (FOTO)  Hip 36% (64% limitation); Predicted 61% (39% limitation)                         [facility] Adult PT Treatment/Exercise - 12/15/19 1057      Exercises   Exercises Knee/Hip      Knee/Hip Exercises: Stretches   Passive Hamstring Stretch Left;30 seconds;2 reps    Passive Hamstring Stretch Limitations supine with strap & seated hip hinge    ITB Stretch Left;30 seconds;2 reps    ITB Stretch Limitations supine cross-body with strap & standing lateral lean at wall    Piriformis Stretch Left;30 seconds;2 reps    Piriformis Stretch Limitations supine KTOS, figure 4 to chest and seated figure 4 hip hinge      Knee/Hip Exercises: Aerobic   Recumbent Bike L2 x 6 min      Manual Therapy   Manual Therapy Soft tissue mobilization;Myofascial release    Manual therapy comments skilled palpation and monitoring during DN     Soft tissue mobilization STM/DTM to L lateral glutes & piriformis, L TFL - very ttp over lateral piriiforis & TFL with limited  tolerance for DTM    Myofascial Release manual TPR to L TFL & lateral glute med/min and piriformis - decreased ttp and bettter tolerance for TPR following DN            Trigger Point Dry Needling - 12/15/19 1057    Consent Given? Yes    Education Handout Provided Yes    Muscles Treated Back/Hip Gluteus minimus;Gluteus medius;Piriformis;Tensor fascia lata   Lt   Gluteus Minimus Response Twitch response elicited;Palpable increased muscle length   Lt lateral   Gluteus Medius Response Twitch response elicited;Palpable increased muscle length   Lt lateral   Piriformis Response Twitch response elicited;Palpable increased muscle length   Lt lateral   Tensor Fascia Lata Response Twitch response elicited;Palpable increased muscle length                  PT Short Term Goals - 12/15/19 1106      PT SHORT TERM GOAL #1   Title Patient will be independent with initial HEP    Status Achieved   12/15/19     PT SHORT  TERM GOAL #2   Title Patient to report pain reduction in frequency and intensity by >/= 25%    Status On-going    Target Date 12/31/19             PT Long Term Goals - 12/15/19 1107      PT LONG TERM GOAL #1   Title Patient will be independent with ongoing/advanced HEP    Status On-going    Target Date 01/21/20      PT LONG TERM GOAL #2   Title Patient to report pain reduction in frequency and intensity by >/= 50%    Status On-going    Target Date 01/21/20      PT LONG TERM GOAL #3   Title Patient will demonstrate improved L hip strength to >/= 4+/5 for improved stability and ease of mobility    Status On-going    Target Date 01/21/20      PT LONG TERM GOAL #4   Title Patient will improve walking tolerance to >/= 30 minutes w/o pain interference to allow her to walk across campus or take her dog for a walk    Status On-going    Target Date 01/21/20      PT LONG TERM GOAL #5   Title Patient will negotiate stairs reciprocally with normal step pattern w/o limitation due to L hip pain or weakness    Status On-going    Target Date 01/21/20                 Plan - 12/15/19 1108    Clinical Impression Statement Avy reports positive response to ionto patch with decreased sensitivity to touch since initial patch. HEP reviewed with good return demonstration (STG #1 met) with pt also noting benefit from figure 4 to chest piriformis stretch (she was already familiar with this from yoga). Provided instruction in seated alternative for piriformis stretch to be performed as needed while at work. Pt very ttp in lateral L hip musculature, esp TFL and lateral piriformis - addressed this with manual STM/DTM and TPR incorporating DN upon informed pt consent with good twitch responses elicited and palpable reduction in muscle tension and ttp noted. Pt deferred ionto patch today, wanting to see how she responds to DN.    Comorbidities L THA 08/22/18 secondary to AVN, R hip AVN, anxiety,  depression    Examination-Activity Limitations Sit;Stand;Locomotion Level;Stairs;Transfers  Examination-Participation Restrictions Community Activity    Rehab Potential Good    PT Frequency 2x / week    PT Duration 6 weeks    PT Treatment/Interventions ADLs/Self Care Home Management;Cryotherapy;Electrical Stimulation;Iontophoresis 49m/ml Dexamethasone;Moist Heat;Ultrasound;Gait training;Stair training;Functional mobility training;Therapeutic activities;Therapeutic exercise;Balance training;Neuromuscular re-education;Patient/family education;Manual techniques;Scar mobilization;Passive range of motion;Dry needling;Taping    PT Next Visit Plan assess response to DN; initiate L hip strengthening; manual therapy with potential DN to address abnormal muscle tension in TLF, glutes, quad and ITB; modalities including ionto patch #2 as benefit noted    PT Home Exercise Plan 7/1 - HS, ITB, glute/piriformis stretches    Consulted and Agree with Plan of Care Patient           Patient will benefit from skilled therapeutic intervention in order to improve the following deficits and impairments:  Abnormal gait, Decreased activity tolerance, Decreased endurance, Decreased mobility, Decreased range of motion, Decreased scar mobility, Decreased strength, Difficulty walking, Increased fascial restricitons, Increased muscle spasms, Impaired flexibility, Pain  Visit Diagnosis: Pain in left hip  Difficulty in walking, not elsewhere classified  Other symptoms and signs involving the musculoskeletal system  Other muscle spasm  Muscle weakness (generalized)     Problem List Patient Active Problem List   Diagnosis Date Noted  . Av[place] [place] 08/22/2018  . Avascular necrosis of femoral head, left ([facility] 08/22/2018    JPercival Spanish PT, MPT 12/15/2019, 12:11 PM  [facility] [place]  [address]Phone: [phone]  Fax:  [phone] Name: Patricia CANULMRN: [id number] of Birth: 1May 01, 1972

## 2019-12-17 ENCOUNTER — Other Ambulatory Visit: Payer: Self-pay

## 2019-12-17 ENCOUNTER — Ambulatory Visit: Payer: No Typology Code available for payment source | Admitting: Physical Therapy

## 2019-12-17 ENCOUNTER — Encounter: Payer: Self-pay | Admitting: Physical Therapy

## 2019-12-17 DIAGNOSIS — M25552 Pain in left hip: Secondary | ICD-10-CM

## 2019-12-17 DIAGNOSIS — M6281 Muscle weakness (generalized): Secondary | ICD-10-CM

## 2019-12-17 DIAGNOSIS — R262 Difficulty in walking, not elsewhere classified: Secondary | ICD-10-CM

## 2019-12-17 DIAGNOSIS — M62838 Other muscle spasm: Secondary | ICD-10-CM

## 2019-12-17 DIAGNOSIS — R29898 Other symptoms and signs involving the musculoskeletal system: Secondary | ICD-10-CM

## 2019-12-17 NOTE — Patient Instructions (Signed)
    Home exercise program created by Sabatino Williard, PT.  For questions, please contact Rody Keadle via phone at [phone] or email at [email or web address]  [facility] [place] [address]  [address] [place], [place], [address] Phone: [phone]   Fax:  [phone]    

## 2019-12-17 NOTE — Therapy (Addendum)
[facility] [address]  [address] [place], [place], [address] Phone: [phone]   Fax:  [phone]  Physical Therapy Treatment  Patient Details  Name: Patricia Mclean MRN: [id number] Date of Birth: 1971-05-19 Referring Provider (PT): Patricia Hitch, MD   Encounter Date: 12/17/2019   PT End of Session - 12/17/19 1104    Visit Number 3    Number of Visits 13    Date for PT Re-Evaluation 01/21/20    Authorization Type [employer] - VL: 45    PT Start Time 1104    PT Stop Time 1146    PT Time Calculation (min) 42 min    Activity Tolerance Patient tolerated treatment well    Behavior During Therapy [facility] for tasks assessed/performed           Past Medical History:  Diagnosis Date  . Anxiety   . GERD (gastroesophageal reflux disease)   . Headache    metoprolol and topamx  . Pneumonia    x3 had vaccine    Past Surgical History:  Procedure Laterality Date  . TONSILLECTOMY    . TOTAL HIP ARTHROPLASTY Left 08/22/2018   Procedure: TOTAL HIP ARTHROPLASTY ANTERIOR APPROACH;  Surgeon: Patricia Geralds, MD;  [facility]: [facility];  Service: Orthopedics;  Laterality: Left;    There were no vitals filed for this visit.   Subjective Assessment - 12/17/19 1106    Subjective Pt feels DN helped with decreased tightness in lateral hip. Did note some increased quad mucsle soreness but thinks this may have been from bike.    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 Luiz [facility])    Patient Stated Goals "to walk (on campus & with dog), do stairs & carry handbag w/o pain; sleep on side"    Currently in Pain? Yes    Pain Score 5     Pain Location Hip    Pain Orientation Left;Lateral    Pain Descriptors / Indicators Aching;Cl[facility] Boll Adult PT Treatment/Exercise - 12/17/19 1104      Knee/Hip Exercises: Aerobic   Recumbent Bike L2 x 6 min      Knee/Hip Exercises: Supine    Bridges Both;10 reps    Bridges Limitations + red TB hip ABD isometric     Other Supine Knee/Hip Exercises B red TB hip clam 10 x 5"      Knee/Hip Exercises: Sidelying   Hip ABduction Left;10 reps    Hip ABduction Limitations + slight hip extension    Clams L clam 10 x 3"    Other Sidelying Knee/Hip Exercises L hip flex/ext, tick-tock taps front/back & CW/CCW circles x 10 each      Modalities   Modalities Iontophoresis      Iontophoresis   Type of Iontophoresis Dexamethasone    Location L greater trochanter    Dose 80 mA-min, 1.0 mL    Time 4-6 hr patch (#2 of 6)      Manual Therapy   Manual Therapy Other (comment)    Other Manual Therapy Provided instruction in self-STM to glutes with ball on wall, piriformis sitting on ball, and [facility] using rolling pin.                  PT Education - 12/17/19 1140    Education Details HEP  update - hip strengthening, self-STM    Person(s) Educated Patient    Methods Explanation;Demonstration;Handout    Comprehension Verbalized understanding;Returned demonstration;Need further instruction            PT Short Term Goals - 12/15/19 1106      PT SHORT TERM GOAL #1   Title Patient will be independent with initial HEP    Status Achieved   12/15/19     PT SHORT TERM GOAL #2   Title Patient to report pain reduction in frequency and intensity by >/= 25%    Status On-going    Target Date 12/31/19             PT Long Term Goals - 12/15/19 1107      PT LONG TERM GOAL #1   Title Patient will be independent with ongoing/advanced HEP    Status On-going    Target Date 01/21/20      PT LONG TERM GOAL #2   Title Patient to report pain reduction in frequency and intensity by >/= 50%    Status On-going    Target Date 01/21/20      PT LONG TERM GOAL #3   Title Patient will demonstrate improved L hip strength to >/= 4+/5 for improved stability and ease of mobility    Status On-going    Target Date 01/21/20      PT LONG  TERM GOAL #4   Title Patient will improve walking tolerance to >/= 30 minutes w/o pain interference to allow her to walk across campus or take her dog for a walk    Status On-going    Target Date 01/21/20      PT LONG TERM GOAL #5   Title Patient will negotiate stairs reciprocally with normal step pattern w/o limitation due to L hip pain or weakness    Status On-going    Target Date 01/21/20                 Plan - 12/17/19 1110    Clinical Impression Statement [name] reports benefit from DN last session with awareness of decreased tightness in L lateral hip. Introduced proximal LE strengthening with pt noting some muscle fatigue/soreness but no increased pain, therefore updated HEP provided to include strengthening exercises. Also provided education in options for self-STM for glutes & piriformis using ball as well as [facility], quads and HS using rolling pin - pt noting limited tolerance for self-STM, therefore encouraged pt to use only gentle pressure for short periods at a time. 2nd ionto patch applied at pt request given benefit noted from initial patch.    Comorbidities L THA 08/22/18 secondary to AVN, R hip AVN, anxiety, depression    Examination-Activity Limitations Sit;Stand;Locomotion Level;Stairs;Transfers    Examination-Participation Restrictions Community Activity    Rehab Potential Good    PT Frequency 2x / week    PT Duration 6 weeks    PT Treatment/Interventions ADLs/Self Care Home Management;Cryotherapy;Electrical Stimulation;Iontophoresis 4mg /ml Dexamethasone;Moist Heat;Ultrasound;Gait training;Stair training;Functional mobility training;Therapeutic activities;Therapeutic exercise;Balance training;Neuromuscular re-education;Patient/family education;Manual techniques;Scar mobilization;Passive range of motion;Dry needling;Taping    PT Next Visit Plan L hip flexibility & strengthening with HEP review as indicated; manual therapy with potential DN to address abnormal muscle  tension in [facility], glutes, quad and [facility]; modalities including ionto patch #3 as benefit noted    PT Home Exercise Plan 7/1 - HS, [facility], glute/piriformis stretches; 7/8 - s/l clam, hip ABD/ext, hip flex/ext, front/back tap, CW/CCW circles, supine red TB clam, bridge + ABD isometric  Consulted and Agree with Plan of Care Patient           Patient will benefit from skilled therapeutic intervention in order to improve the following deficits and impairments:  Abnormal gait, Decreased activity tolerance, Decreased endurance, Decreased mobility, Decreased range of motion, Decreased scar mobility, Decreased strength, Difficulty walking, Increased fascial restricitons, Increased muscle spasms, Impaired flexibility, Pain  Visit Diagnosis: Pain in left hip  Difficulty in walking, not elsewhere classified  Other symptoms and signs involving the musculoskeletal system  Other muscle spasm  Muscle weakness (generalized)     Problem List Patient Active Problem List   Diagnosis Date Noted  . Avascular necrosis of hip, left (HCC) 08/22/2018  . Avascular necrosis of femoral head, left (HCC) 08/22/2018    Marry Guan, PT, MPT 12/17/2019, 12:14 PM  [facility] [address]  [address] [place], [place], [address] Phone: [phone]   Fax:  [phone]  Name: Patricia [facility] MRN: [id number] Date of Birth: Aug 10, 1970

## 2019-12-21 ENCOUNTER — Other Ambulatory Visit: Payer: Self-pay

## 2019-12-21 ENCOUNTER — Ambulatory Visit: Payer: No Typology Code available for payment source

## 2019-12-21 DIAGNOSIS — R29898 Other symptoms and signs involving the musculoskeletal system: Secondary | ICD-10-CM

## 2019-12-21 DIAGNOSIS — M25552 Pain in left hip: Secondary | ICD-10-CM | POA: Diagnosis not present

## 2019-12-21 DIAGNOSIS — M62838 Other muscle spasm: Secondary | ICD-10-CM

## 2019-12-21 DIAGNOSIS — M6281 Muscle weakness (generalized): Secondary | ICD-10-CM

## 2019-12-21 DIAGNOSIS — R262 Difficulty in walking, not elsewhere classified: Secondary | ICD-10-CM

## 2019-12-21 NOTE — Therapy (Signed)
[facility] [address]  [address] [place], [place], [address] Phone: [phone]   Fax:  [phone]  Physical Therapy Treatment  Patient Details  Name: Patricia [facility] MRN: [id number] Date of Birth: 11/18/1970 Referring Provider (PT): Kathryne Hitch, MD   Encounter Date: 12/21/2019   PT End of Session - 12/21/19 1716    Visit Number 4    Number of Visits 13    Date for PT Re-Evaluation 01/21/20    Authorization Type [employer] - VL: 45    PT Start Time 1705    PT Stop Time 1805    PT Time Calculation (min) 60 min    Activity Tolerance Patient tolerated treatment well    Behavior During Therapy [facility] for tasks assessed/performed           Past Medical History:  Diagnosis Date  . Anxiety   . GERD (gastroesophageal reflux disease)   . Headache    metoprolol and topamx  . Pneumonia    x3 had vaccine    Past Surgical History:  Procedure Laterality Date  . TONSILLECTOMY    . TOTAL HIP ARTHROPLASTY Left 08/22/2018   Procedure: TOTAL HIP ARTHROPLASTY ANTERIOR APPROACH;  Surgeon: Jodi Geralds, MD;  Location: [facility];  Service: Orthopedics;  Laterality: Left;    There were no vitals filed for this visit.   Subjective Assessment - 12/21/19 1710    Subjective Pt. reporting she had good benefit from DN a few visits ago.    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 Luiz Blare)    Patient Stated Goals "to walk (on campus & with dog), do stairs & carry handbag w/o pain; sleep on side"    Currently in Pain? No/denies    Pain Score 0-No pain                             [facility] Adult PT Treatment/Exercise - 12/21/19 0001      Knee/Hip Exercises: Stretches   Passive Hamstring Stretch Left;30 seconds;2 reps    Passive Hamstring Stretch Limitations supine with strap & seated hip hinge      Knee/Hip Exercises: Aerobic   Recumbent Bike L2 x 6 min      Knee/Hip Exercises: Standing   Hip  Abduction Right;Left;10 reps;Knee straight;Stengthening    Abduction Limitations at counter       Knee/Hip Exercises: Supine   Bridges Both;15 reps;Strengthening    Bridges Limitations + red TB hip ABD isometric       Knee/Hip Exercises: Sidelying   Clams L clam 10 x 3"      Modalities   Modalities Electrical Stimulation;Moist Heat      Electrical Stimulation   Electrical Stimulation Location L buttocks     Electrical Stimulation Action IFC    Electrical Stimulation Parameters 80-150Hz , intensity to pt. tolerance, 10'    Electrical Stimulation Goals Pain;Tone      Manual Therapy   Soft tissue mobilization STM/DTM to L FTL, piriformis, glute med, glute max    Myofascial Release Manual TPR to L TFL, L piriformis                     PT Short Term Goals - 12/15/19 1106      PT SHORT TERM GOAL #1   Title Patient will be independent with initial HEP    Status Achieved   12/15/19  PT SHORT TERM GOAL #2   Title Patient to report pain reduction in frequency and intensity by >/= 25%    Status On-going    Target Date 12/31/19             PT Long Term Goals - 12/15/19 1107      PT LONG TERM GOAL #1   Title Patient will be independent with ongoing/advanced HEP    Status On-going    Target Date 01/21/20      PT LONG TERM GOAL #2   Title Patient to report pain reduction in frequency and intensity by >/= 50%    Status On-going    Target Date 01/21/20      PT LONG TERM GOAL #3   Title Patient will demonstrate improved L hip strength to >/= 4+/5 for improved stability and ease of mobility    Status On-going    Target Date 01/21/20      PT LONG TERM GOAL #4   Title Patient will improve walking tolerance to >/= 30 minutes w/o pain interference to allow her to walk across campus or take her dog for a walk    Status On-going    Target Date 01/21/20      PT LONG TERM GOAL #5   Title Patient will negotiate stairs reciprocally with normal step pattern w/o limitation  due to L hip pain or weakness    Status On-going    Target Date 01/21/20                 Plan - 12/21/19 1803    Clinical Impression Statement Pt. reporting good benefit from DN in past visits.  Very ttp with MT today in L external rotators, glute Medius which responded well to MT and with improved tolerance for sidelying clam shell following.  Pt. opting to skip ionto patch to end session despite noting benefit opting for patch in next session.  Ended visit with trial of E-stim/moist heat to L buttocks in hooklying for reduction in pain and tone.    Comorbidities L THA 08/22/18 secondary to AVN, R hip AVN, anxiety, depression    Rehab Potential Good    PT Treatment/Interventions ADLs/Self Care Home Management;Cryotherapy;Electrical Stimulation;Iontophoresis 4mg /ml Dexamethasone;Moist Heat;Ultrasound;Gait training;Stair training;Functional mobility training;Therapeutic activities;Therapeutic exercise;Balance training;Neuromuscular re-education;Patient/family education;Manual techniques;Scar mobilization;Passive range of motion;Dry needling;Taping    PT Next Visit Plan L hip flexibility & strengthening with HEP review as indicated; manual therapy with potential DN to address abnormal muscle tension in TLF, glutes, quad and ITB; modalities including ionto patch #3 as benefit noted    PT Home Exercise Plan 7/1 - HS, ITB, glute/piriformis stretches; 7/8 - s/l clam, hip ABD/ext, hip flex/ext, front/back tap, CW/CCW circles, supine red TB clam, bridge + ABD isometric    Consulted and Agree with Plan of Care Patient           Patient will benefit from skilled therapeutic intervention in order to improve the following deficits and impairments:  Abnormal gait, Decreased activity tolerance, Decreased endurance, Decreased mobility, Decreased range of motion, Decreased scar mobility, Decreased strength, Difficulty walking, Increased fascial restricitons, Increased muscle spasms, Impaired flexibility,  Pain  Visit Diagnosis: Pain in left hip  Difficulty in walking, not elsewhere classified  Other symptoms and signs involving the musculoskeletal system  Other muscle spasm  Muscle weakness (generalized)     Problem List Patient Active Problem List   Diagnosis Date Noted  . Avascular necrosis of hip, left (HCC) 08/22/2018  . Avascular necrosis  of femoral head, left [facility]) 08/22/2018    Kermit Balo, PTA 12/21/19 6:21 PM    [facility] [address]  [address] [place], [place], [address] Phone: [phone]   Fax:  [phone]  Name: [facility] MRN: [id number] Date of Birth: Jun 20, 1970

## 2019-12-23 ENCOUNTER — Ambulatory Visit: Payer: No Typology Code available for payment source

## 2019-12-23 ENCOUNTER — Other Ambulatory Visit: Payer: Self-pay

## 2019-12-23 DIAGNOSIS — M25552 Pain in left hip: Secondary | ICD-10-CM | POA: Diagnosis not present

## 2019-12-23 DIAGNOSIS — R262 Difficulty in walking, not elsewhere classified: Secondary | ICD-10-CM

## 2019-12-23 DIAGNOSIS — M6281 Muscle weakness (generalized): Secondary | ICD-10-CM

## 2019-12-23 DIAGNOSIS — M62838 Other muscle spasm: Secondary | ICD-10-CM

## 2019-12-23 DIAGNOSIS — R29898 Other symptoms and signs involving the musculoskeletal system: Secondary | ICD-10-CM

## 2019-12-23 NOTE — Therapy (Signed)
[facility] [address]  [address] [place], [place], [address] Phone: [phone]   Fax:  [phone]  Physical Therapy Treatment  Patient Details  Name: [facility] MRN: [id number] Date of Birth: 1970/08/27 Referring Provider (PT): Kathryne Hitch, MD   Encounter Date: 12/23/2019   PT End of Session - 12/23/19 1705    Visit Number 5    Number of Visits 13    Date for PT Re-Evaluation 01/21/20    Authorization Type [employer] - VL: 45    PT Start Time 1659    PT Stop Time 1745    PT Time Calculation (min) 46 min    Activity Tolerance Patient tolerated treatment well    Behavior During Therapy [facility] for tasks assessed/performed           Past Medical History:  Diagnosis Date   Anxiety    GERD (gastroesophageal reflux disease)    Headache    metoprolol and topamx   Pneumonia    x3 had vaccine    Past Surgical History:  Procedure Laterality Date   TONSILLECTOMY     TOTAL HIP ARTHROPLASTY Left 08/22/2018   Procedure: TOTAL HIP ARTHROPLASTY ANTERIOR APPROACH;  Surgeon: Jodi Geralds, MD;  Location: [facility];  Service: Orthopedics;  Laterality: Left;    There were no vitals filed for this visit.   Subjective Assessment - 12/23/19 1702    Subjective Pt. noting some improvement in sidelying clam shell exercise now that she is not using the red TB resistance.    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 Luiz Blare)    Patient Stated Goals "to walk (on campus & with dog), do stairs & carry handbag w/o pain; sleep on side"    Currently in Pain? Yes    Pain Score 2    up to 8-9/10 at most with prolonged standing at work function last night   Pain Location Hip    Pain Orientation Left;Lateral    Pain Descriptors / Indicators Aching;Sharp    Pain Type Chronic pain    Pain Radiating Towards down to mid lateral ITB and up into TFL    Pain Frequency Intermittent    Aggravating Factors  standing and  walking after prolonged sitting, prolonged standing; pressure if handbag bumps into hip    Multiple Pain Sites No                             [facility] Adult PT Treatment/Exercise - 12/23/19 0001      Knee/Hip Exercises: Stretches   Lobbyist Left;1 rep;30 seconds    Quad Stretch Limitations prone with strap and bolster under anterior thigh     Hip Flexor Stretch Left;2 reps;30 seconds    Hip Flexor Stretch Limitations mod thomas position with strap     ITB Stretch Left;1 rep;30 seconds    ITB Stretch Limitations supine     Piriformis Stretch Left;30 seconds;2 reps    Piriformis Stretch Limitations supine KTOS, figure 4 to chest supine     Other Knee/Hip Stretches L glute figure-4 stretch x 30 sec seated in chair       Knee/Hip Exercises: Aerobic   Recumbent Bike L2 x 6 min      Knee/Hip Exercises: Standing   Forward Step Up Left;10 reps;Step Height: 6";Hand Hold: 1    Forward Step Up Limitations cues for L LE effort  Functional Squat 10 reps;3 seconds    Functional Squat Limitations counter       Knee/Hip Exercises: Supine   Bridges Both;15 reps;Strengthening      Knee/Hip Exercises: Sidelying   Clams L clam 10 x 3"   much improved tolerance      Iontophoresis   Type of Iontophoresis Dexamethasone    Location L greater trochanter    Dose 80 mA-min, 1.0 mL    Time 4-6 hr patch (#3 of 6)      Manual Therapy   Manual Therapy Soft tissue mobilization;Myofascial release    Manual therapy comments sidelying     Soft tissue mobilization STM/DTM to L FTL, piriformis, glute med, glute max    Myofascial Release Manual TPR to L TFL, L piriformis                     PT Short Term Goals - 12/15/19 1106      PT SHORT TERM GOAL #1   Title Patient will be independent with initial HEP    Status Achieved   12/15/19     PT SHORT TERM GOAL #2   Title Patient to report pain reduction in frequency and intensity by >/= 25%    Status On-going    Target  Date 12/31/19             PT Long Term Goals - 12/15/19 1107      PT LONG TERM GOAL #1   Title Patient will be independent with ongoing/advanced HEP    Status On-going    Target Date 01/21/20      PT LONG TERM GOAL #2   Title Patient to report pain reduction in frequency and intensity by >/= 50%    Status On-going    Target Date 01/21/20      PT LONG TERM GOAL #3   Title Patient will demonstrate improved L hip strength to >/= 4+/5 for improved stability and ease of mobility    Status On-going    Target Date 01/21/20      PT LONG TERM GOAL #4   Title Patient will improve walking tolerance to >/= 30 minutes w/o pain interference to allow her to walk across campus or take her dog for a walk    Status On-going    Target Date 01/21/20      PT LONG TERM GOAL #5   Title Patient will negotiate stairs reciprocally with normal step pattern w/o limitation due to L hip pain or weakness    Status On-going    Target Date 01/21/20                 Plan - 12/23/19 1711    Clinical Impression Statement Pt. remains point tender over L GT and with L lateral LE pain toward L knee.  Patient noting improved comfort with sidelying hip abduction and sidelying clam shell following MT to L glutes, TFL today.  Duration of session focused on proximal L hip strengthening therex with cueing for L LE loading and avoiding compensations. Pt. very compliant and motived with therapy.  Noted benefit from past iontophoresis patches applied to L GT area, and with patient ttp in this area, applied ionto patch #3/6 to end session.  Will monitor response to ionto and continue L proximal hip strengthening/LE flexibility activities and possible DN prn in coming sessions.    Comorbidities L THA 08/22/18 secondary to AVN, R hip AVN, anxiety, depression    Rehab Potential  Good    PT Frequency 2x / week    PT Treatment/Interventions ADLs/Self Care Home Management;Cryotherapy;Electrical Stimulation;Iontophoresis  4mg /ml Dexamethasone;Moist Heat;Ultrasound;Gait training;Stair training;Functional mobility training;Therapeutic activities;Therapeutic exercise;Balance training;Neuromuscular re-education;Patient/family education;Manual techniques;Scar mobilization;Passive range of motion;Dry needling;Taping    PT Next Visit Plan L hip flexibility & strengthening;  manual therapy with potential DN to address abnormal muscle tension in TLF, glutes, quad and ITB; modalities including ionto patch #4 as benefit noted    PT Home Exercise Plan 7/1 - HS, ITB, glute/piriformis stretches; 7/8 - s/l clam, hip ABD/ext, hip flex/ext, front/back tap, CW/CCW circles, supine red TB clam, bridge + ABD isometric    Consulted and Agree with Plan of Care Patient           Patient will benefit from skilled therapeutic intervention in order to improve the following deficits and impairments:  Abnormal gait, Decreased activity tolerance, Decreased endurance, Decreased mobility, Decreased range of motion, Decreased scar mobility, Decreased strength, Difficulty walking, Increased fascial restricitons, Increased muscle spasms, Impaired flexibility, Pain  Visit Diagnosis: Pain in left hip  Difficulty in walking, not elsewhere classified  Other symptoms and signs involving the musculoskeletal system  Other muscle spasm  Muscle weakness (generalized)     Problem List Patient Active Problem List   Diagnosis Date Noted   Avascular necrosis of hip, left (HCC) 08/22/2018   Avascular necrosis of femoral head, left (HCC) 08/22/2018    08/24/2018, PTA 12/23/19 6:04 PM   [facility] [address]  [address] [place], [place], [place] Phone: [phone]   Fax:  [phone]  Name: JUNE RODE MRN: [id number] Date of Birth: [birth year]

## 2019-12-28 ENCOUNTER — Other Ambulatory Visit: Payer: Self-pay

## 2019-12-28 ENCOUNTER — Ambulatory Visit: Payer: No Typology Code available for payment source | Admitting: Physical Therapy

## 2019-12-28 ENCOUNTER — Encounter: Payer: Self-pay | Admitting: Physical Therapy

## 2019-12-28 DIAGNOSIS — M62838 Other muscle spasm: Secondary | ICD-10-CM

## 2019-12-28 DIAGNOSIS — M25552 Pain in left hip: Secondary | ICD-10-CM | POA: Diagnosis not present

## 2019-12-28 DIAGNOSIS — R29898 Other symptoms and signs involving the musculoskeletal system: Secondary | ICD-10-CM

## 2019-12-28 DIAGNOSIS — M6281 Muscle weakness (generalized): Secondary | ICD-10-CM

## 2019-12-28 DIAGNOSIS — R262 Difficulty in walking, not elsewhere classified: Secondary | ICD-10-CM

## 2019-12-28 NOTE — Patient Instructions (Signed)
TENS UNIT  This is helpful for muscle pain and spasm.   Search and Purchase a TENS 7000 2nd edition at www.tenspros.com or www.amazon.com  (It should be less than $30)     TENS unit instructions:   Do not shower or bathe with the unit on  Turn the unit off before removing electrodes or batteries  If the electrodes lose stickiness add a drop of water to the electrodes after they are disconnected from the unit and place on plastic sheet. If you continued to have difficulty, call the TENS unit company to purchase more electrodes.  Do not apply lotion on the skin area prior to use. Make sure the skin is clean and dry as this will help prolong the life of the electrodes.  After use, always check skin for unusual red areas, rash or other skin difficulties. If there are any skin problems, does not apply electrodes to the same area.  Never remove the electrodes from the unit by pulling the wires.  Do not use the TENS unit or electrodes other than as directed.  Do not change electrode placement without consulting your therapist or physician.  Keep 2 fingers with between each electrode.   TENS stands for Transcutaneous Electrical Nerve Stimulation. In other words, electrical impulses are allowed to pass through the skin in order to excite a nerve.   Purpose and Use of TENS:  TENS is a method used to manage acute and chronic pain without the use of drugs. It has been effective in managing pain associated with surgery, sprains, strains, trauma, rheumatoid arthritis, and neuralgias. It is a non-addictive, low risk, and non-invasive technique used to control pain. It is not, by any means, a curative form of treatment.   How TENS Works:  Most TENS units are a Statistician unit powered by one 9 volt battery. Attached to the outside of the unit are two lead wires where two pins and/or snaps connect on each wire. All units come with a set of four reusable pads or electrodes. These are placed  on the skin surrounding the area involved. By inserting the leads into  the pads, the electricity can pass from the unit making the circuit complete.  As the intensity is turned up slowly, the electrical current enters the body from the electrodes through the skin to the surrounding nerve fibers. This triggers the release of hormones from within the body. These hormones contain pain relievers. By increasing the circulation of these hormones, the persons pain may be lessened. It is also believed that the electrical stimulation itself helps to block the pain messages being sent to the brain, thus also decreasing the bodys perception of pain.   Hazards:  TENS units are NOT to be used by patients with PACEMAKERS, DEFIBRILLATORS, DIABETIC PUMPS, PREGNANT WOMEN, and patients with SEIZURE DISORDERS.  TENS units are NOT to be used over the heart, throat, brain, or spinal cord.  One of the major side effects from the TENS unit may be skin irritation. Some people may develop a rash if they are sensitive to the materials used in the electrodes or the connecting wires.   Wear the unit for 30-45 minutes up to 3-4x/day as needed for pain.   Avoid overuse due the body getting used to the stem making it not as effective over time.

## 2019-12-28 NOTE — Therapy (Signed)
[facility] [address]  [address] [place], [place], [address] Phone: [phone]   Fax:  [phone]  Physical Therapy Treatment  Patient Details  Name: Patricia Mclean MRN: [id number] Date of Birth: 1971/03/17 Referring Provider (PT): Patricia Hitch, MD   Encounter Date: 12/28/2019   PT End of Session - 12/28/19 0802    Visit Number 6    Number of Visits 13    Date for PT Re-Evaluation 01/21/20    Authorization Type [employer] - VL: 45    PT Start Time 0802    PT Stop Time 0905    PT Time Calculation (min) 63 min    Activity Tolerance Patient tolerated treatment well    Behavior During Therapy [facility] for tasks assessed/performed           Past Medical History:  Diagnosis Date  . Anxiety   . GERD (gastroesophageal reflux disease)   . Headache    metoprolol and topamx  . Pneumonia    x3 had vaccine    Past Surgical History:  Procedure Laterality Date  . TONSILLECTOMY    . TOTAL HIP ARTHROPLASTY Left 08/22/2018   Procedure: TOTAL HIP ARTHROPLASTY ANTERIOR APPROACH;  Surgeon: Patricia Geralds, MD;  Location: [facility];  Service: Orthopedics;  Laterality: Left;    There were no vitals filed for this visit.   Subjective Assessment - 12/28/19 0804    Subjective Pt stating "for the first time in a long time, I'm feeling some hope about getting better". Notes less pain with walking and states other people have noticed she is walking better.    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 Luiz Mclean)    Patient Stated Goals "to walk (on campus & with dog), do stairs & carry handbag w/o pain; sleep on side"    Currently in Pain? Yes    Pain Score 4    5/10 when purse touched her hip   Pain Location Hip    Pain Orientation Left;Lateral    Pain Descriptors / Indicators Sharp;Aching    Pain Type Chronic pain    Pain Frequency Intermittent                             [facility] Adult PT  Treatment/Exercise - 12/28/19 0802      Knee/Hip Exercises: Aerobic   Nustep L4 x 6 min      Modalities   Modalities Electrical Stimulation;Moist Heat      Moist Heat Therapy   Number Minutes Moist Heat 15 Minutes    Moist Heat Location Hip   Lt     Electrical Stimulation   Electrical Stimulation Location L lateral hip and buttocks    Electrical Stimulation Action IFC    Electrical Stimulation Parameters 80-150Hz , intensity to pt tolerance x 15'    Electrical Stimulation Goals Pain;Tone      Manual Therapy   Manual Therapy Soft tissue mobilization;Myofascial release    Manual therapy comments skilled palpation and monitoring during DN     Soft tissue mobilization STM/DTM to L lateral glutes, piriformis, L TFL & lateral quads - very ttp over lateral piriiforis & TFL with limited tolerance for DTM    Myofascial Release manual TPR to L TFL, quads & lateral glute med/min and piriformis - decreased ttp and bettter tolerance for TPR following DN            Trigger Point  Dry Needling - 12/28/19 0802    Consent Given? Yes    Education Handout Provided Previously provided    Muscles Treated Lower Quadrant Quadriceps   Lt VL   Muscles Treated Back/Hip Gluteus minimus;Gluteus medius;Piriformis;Tensor fascia lata   Lt   Electrical Stimulation Performed with Dry Needling Yes    E-stim with Dry Needling Details quads, glutes & piriformis    Quadriceps Response Twitch response elicited;Palpable increased muscle length    Gluteus Minimus Response Twitch response elicited;Palpable increased muscle length    Gluteus Medius Response Twitch response elicited;Palpable increased muscle length    Piriformis Response Twitch response elicited;Palpable increased muscle length    Tensor Fascia Lata Response Twitch response elicited;Palpable increased muscle length                  PT Short Term Goals - 12/28/19 0808      PT SHORT TERM GOAL #1   Title Patient will be independent with  initial HEP    Status Achieved   12/15/19     PT SHORT TERM GOAL #2   Title Patient to report pain reduction in frequency and intensity by >/= 25%    Status Achieved   7/19 - pt reporting 20-25% reduction in pain with walking   Target Date 12/31/19             PT Long Term Goals - 12/15/19 1107      PT LONG TERM GOAL #1   Title Patient will be independent with ongoing/advanced HEP    Status On-going    Target Date 01/21/20      PT LONG TERM GOAL #2   Title Patient to report pain reduction in frequency and intensity by >/= 50%    Status On-going    Target Date 01/21/20      PT LONG TERM GOAL #3   Title Patient will demonstrate improved L hip strength to >/= 4+/5 for improved stability and ease of mobility    Status On-going    Target Date 01/21/20      PT LONG TERM GOAL #4   Title Patient will improve walking tolerance to >/= 30 minutes w/o pain interference to allow her to walk across campus or take her dog for a walk    Status On-going    Target Date 01/21/20      PT LONG TERM GOAL #5   Title Patient will negotiate stairs reciprocally with normal step pattern w/o limitation due to L hip pain or weakness    Status On-going    Target Date 01/21/20                 Plan - 12/28/19 0809    Clinical Impression Statement Patricia Mclean reporting 20-25% reduction in pain with walking with PT thus far and states other people have noticed her gait pattern is less altered, although she remains very sensitive to any type of pressure to lateral hip such as when "bumped" by her purse. She notes benefit from initial DN treatment and requests this again today, therefore incorporated DN with estim during manual therapy with good twitch responses elicited and palpable reduction in muscle tension. Session concluded with IFC estim and moist heat to promote further reduction in pain, ttp and muscle tension as pt noting benefit from this in prior sessions. With pt verbal consent, will submit  her information to [employer] to determine coverage for home Flex-IT TENS unit.    Comorbidities L THA 08/22/18 secondary to AVN, R  hip AVN, anxiety, depression    Rehab Potential Good    PT Frequency 2x / week    PT Duration 6 weeks    PT Treatment/Interventions ADLs/Self Care Home Management;Cryotherapy;Electrical Stimulation;Iontophoresis 4mg /ml Dexamethasone;Moist Heat;Ultrasound;Gait training;Stair training;Functional mobility training;Therapeutic activities;Therapeutic exercise;Balance training;Neuromuscular re-education;Patient/family education;Manual techniques;Scar mobilization;Passive range of motion;Dry needling;Taping    PT Next Visit Plan L hip flexibility & strengthening;  manual therapy with potential DN to address abnormal muscle tension in TLF, glutes, quad and ITB; modalities including ionto patch #4 as benefit noted    PT Home Exercise Plan 7/1 - HS, ITB, glute/piriformis stretches; 7/8 - s/l clam, hip ABD/ext, hip flex/ext, front/back tap, CW/CCW circles, supine red TB clam, bridge + ABD isometric    Consulted and Agree with Plan of Care Patient           Patient will benefit from skilled therapeutic intervention in order to improve the following deficits and impairments:  Abnormal gait, Decreased activity tolerance, Decreased endurance, Decreased mobility, Decreased range of motion, Decreased scar mobility, Decreased strength, Difficulty walking, Increased fascial restricitons, Increased muscle spasms, Impaired flexibility, Pain  Visit Diagnosis: Pain in left hip  Difficulty in walking, not elsewhere classified  Other symptoms and signs involving the musculoskeletal system  Other muscle spasm  Muscle weakness (generalized)     Problem List Patient Active Problem List   Diagnosis Date Noted  . Avascular necrosis of hip, left (HCC) 08/22/2018  . Avascular necrosis of femoral head, left (HCC) 08/22/2018    08/24/2018, PT, MPT 12/28/2019, 9:18 AM  [facility] [address]  [address] [place], [place], [place] Phone: [phone]   Fax:  [phone]  Name: Patricia Mclean MRN: Felipa Eth Date of Birth: 1970-07-05

## 2020-01-01 ENCOUNTER — Ambulatory Visit: Payer: No Typology Code available for payment source | Admitting: Physical Therapy

## 2020-01-01 ENCOUNTER — Encounter: Payer: Self-pay | Admitting: Physical Therapy

## 2020-01-01 ENCOUNTER — Other Ambulatory Visit: Payer: Self-pay

## 2020-01-01 DIAGNOSIS — M25552 Pain in left hip: Secondary | ICD-10-CM

## 2020-01-01 DIAGNOSIS — M62838 Other muscle spasm: Secondary | ICD-10-CM

## 2020-01-01 DIAGNOSIS — R262 Difficulty in walking, not elsewhere classified: Secondary | ICD-10-CM

## 2020-01-01 DIAGNOSIS — M6281 Muscle weakness (generalized): Secondary | ICD-10-CM

## 2020-01-01 DIAGNOSIS — R29898 Other symptoms and signs involving the musculoskeletal system: Secondary | ICD-10-CM

## 2020-01-01 NOTE — Therapy (Signed)
[facility] Outpatient Rehabilitation [facility] [address]  [address] [place], [place], [address] Phone: [phone]   Fax:  [phone]  Physical Therapy Treatment  Patient Details  Name: Patricia Mclean MRN: [id number] Date of Birth: 26-Aug-1970 Referring Provider (PT): Kathryne Hitch, MD   Encounter Date: 01/01/2020   PT End of Session - 01/01/20 1104    Visit Number 7    Number of Visits 13    Date for PT Re-Evaluation 01/21/20    Authorization Type [employer] - VL: 45    PT Start Time 1104    PT Stop Time 1146    PT Time Calculation (min) 42 min    Activity Tolerance Patient tolerated treatment well    Behavior During Therapy [facility] for tasks assessed/performed           Past Medical History:  Diagnosis Date  . Anxiety   . GERD (gastroesophageal reflux disease)   . Headache    metoprolol and topamx  . Pneumonia    x3 had vaccine    Past Surgical History:  Procedure Laterality Date  . TONSILLECTOMY    . TOTAL HIP ARTHROPLASTY Left 08/22/2018   Procedure: TOTAL HIP ARTHROPLASTY ANTERIOR APPROACH;  Surgeon: Jodi Geralds, MD;  Location: [facility];  Service: Orthopedics;  Laterality: Left;    There were no vitals filed for this visit.   Subjective Assessment - 01/01/20 1106    Subjective Pt noting increased lateral hip soreness this week, but does admit to less active with HEP due to having dental surgery on Tuesday.    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 Luiz Blare)    Patient Stated Goals "to walk (on campus & with dog), do stairs & carry handbag w/o pain; sleep on side"    Currently in Pain? Yes    Pain Score 7     Pain Location Hip    Pain Orientation Left;Lateral    Pain Descriptors / Indicators Sharp   "pinchy"             [facility] PT Assessment - 01/01/20 1104      Assessment   Next MD Visit 01/14/20                         [facility] Adult PT Treatment/Exercise - 01/01/20 1104      Exercises    Exercises Knee/Hip      Knee/Hip Exercises: Stretches   Piriformis Stretch Left;30 seconds;4 reps    Piriformis Stretch Limitations supine KTOS, figure 4 with overpressure & figure 4 to chest supine; seated figure 4 hip hinge      Knee/Hip Exercises: Aerobic   Nustep L4 x 6 min (UE/LE)   seat #7     Manual Therapy   Manual Therapy Soft tissue mobilization;Myofascial release;Taping    Manual therapy comments skilled palpation and monitoring during DN     Soft tissue mobilization STM/DTM to L lateral glutes, piriformis and lateral HS - very ttp over lateral piriiforis     Myofascial Release manual TPR to L lateral glute med/min and piriformis - decreased ttp and bettter tolerance for TPR following DN; Pin & stretch to L lateral HS    Kinesiotex Inhibit Muscle;Create Space      Kinesiotix   Create Space 50% star over L greater trochanter using tail of piriformis strip    Inhibit Muscle  30-50% strip along L piriformis extending over L greater trochanter  Trigger Point Dry Needling - 01/01/20 1104    Consent Given? Yes    Muscles Treated Lower Quadrant Hamstring   Lt lateral   Muscles Treated Back/Hip Gluteus minimus;Gluteus medius;Piriformis   Lt   E-stim with Dry Needling Details L piriformis    Hamstring Response Twitch response elicited;Palpable increased muscle length    Gluteus Minimus Response Twitch response elicited;Palpable increased muscle length    Gluteus Medius Response Twitch response elicited;Palpable increased muscle length    Piriformis Response Twitch response elicited;Palpable increased muscle length                PT Education - 01/01/20 1145    Education Details Role of kinesiotape and wearing instructions    Person(s) Educated Patient    Methods Explanation;Demonstration;Handout    Comprehension Verbalized understanding            PT Short Term Goals - 01/01/20 1111      PT SHORT TERM GOAL #1   Title Patient will be independent with  initial HEP    Status Achieved   12/15/19     PT SHORT TERM GOAL #2   Title Patient to report pain reduction in frequency and intensity by >/= 25%    Status Achieved   7/19 - pt reporting 20-25% reduction in pain with walking            PT Long Term Goals - 12/15/19 1107      PT LONG TERM GOAL #1   Title Patient will be independent with ongoing/advanced HEP    Status On-going    Target Date 01/21/20      PT LONG TERM GOAL #2   Title Patient to report pain reduction in frequency and intensity by >/= 50%    Status On-going    Target Date 01/21/20      PT LONG TERM GOAL #3   Title Patient will demonstrate improved L hip strength to >/= 4+/5 for improved stability and ease of mobility    Status On-going    Target Date 01/21/20      PT LONG TERM GOAL #4   Title Patient will improve walking tolerance to >/= 30 minutes w/o pain interference to allow her to walk across campus or take her dog for a walk    Status On-going    Target Date 01/21/20      PT LONG TERM GOAL #5   Title Patient will negotiate stairs reciprocally with normal step pattern w/o limitation due to L hip pain or weakness    Status On-going    Target Date 01/21/20                 Plan - 01/01/20 1112    Clinical Impression Statement [name] reporting increased lateral hip/buttock pain this week potentially due to increased inactivity following dental implant surgery. She remains very ttp with increased muscle tension in L lateral piriformis and to a lesser degree in lateral glutes and hamstring - all addressed with MT incorporating DN +/- estim followed by review of relevant stretching and application of kinesiotape over L piriformis and greater trochanter. Pt declining need for estim and moist heat to end session but remains interested in a home TENS unit. [employer] reporting [employer] has denied coverage but still awaiting reason for denial - pt may be interested in out-of-pocket purchase of unit, therefore vendor  contacted to inquire about pricing.    Comorbidities L THA 08/22/18 secondary to AVN, R hip AVN, anxiety, depression  Rehab Potential Good    PT Frequency 2x / week    PT Duration 6 weeks    PT Treatment/Interventions ADLs/Self Care Home Management;Cryotherapy;Electrical Stimulation;Iontophoresis 4mg /ml Dexamethasone;Moist Heat;Ultrasound;Gait training;Stair training;Functional mobility training;Therapeutic activities;Therapeutic exercise;Balance training;Neuromuscular re-education;Patient/family education;Manual techniques;Scar mobilization;Passive range of motion;Dry needling;Taping    PT Next Visit Plan assess response to [place]; L hip flexibility & strengthening; manual therapy with potential DN to address abnormal muscle tension in TLF, glutes, quad and ITB; modalities including ionto patch #4 as benefit noted    PT Home Exercise Plan 7/1 - HS, ITB, glute/piriformis stretches; 7/8 - s/l clam, hip ABD/ext, hip flex/ext, front/back tap, CW/CCW circles, supine red TB clam, bridge + ABD isometric    Consulted and Agree with Plan of Care Patient           Patient will benefit from skilled therapeutic intervention in order to improve the following deficits and impairments:  Abnormal gait, Decreased activity tolerance, Decreased endurance, Decreased mobility, Decreased range of motion, Decreased scar mobility, Decreased strength, Difficulty walking, Increased fascial restricitons, Increased muscle spasms, Impaired flexibility, Pain  Visit Diagnosis: Pain in left hip  Difficulty in walking, not elsewhere classified  Other symptoms and signs involving the musculoskeletal system  Other muscle spasm  Muscle weakness (generalized)     Problem List Patient Active Problem List   Diagnosis Date Noted  . Avascular necrosis of hip, left (HCC) 08/22/2018  . Avascular necrosis of femoral head, left (HCC) 08/22/2018    08/24/2018, PT, MPT 01/01/2020, 12:31 PM  [facility] [address]  [address] [place], [place], [place] Phone: [phone]   Fax:  [phone]  Name: JAZIYA OBARR MRN: Felipa Eth Date of Birth: [birth year]

## 2020-01-01 NOTE — Patient Instructions (Signed)
   Kinesiology tape  What is kinesiology tape?  There are many brands of kinesiology tape. KTape, Rock Tape, Body Sport, Dynamic tape, to name a few.  It is an elasticized tape designed to support the body's natural healing process. This tape provides stability and support to muscles and joints without restricting motion.  It can also help decrease swelling in the area of application.  How does it work?  The tape microscopically lifts and decompresses the skin to allow for drainage of lymph (swelling) to flow away from area, reducing inflammation. The tape has the ability to help re-educate the neuromuscular system by targeting specific receptors in the skin. The presence of the tape increases the body's awareness of posture and body mechanics.  Do not use with:  . Open wounds . Skin lesions . Adhesive allergies  In some rare cases, mild/moderate skin irritation can occur. This can include redness, itchiness, or hives. If this occurs, immediately remove tape and consult your primary care physician if symptoms are severe or do not resolve within 2 days.  Safe removal of the tape:  To remove tape safely, hold nearby skin with one hand and gentle roll tape down with other hand. You can apply oil or conditioner to tape while in shower prior to removal to loosen adhesive. DO NOT swiftly rip tape off like a band-aid, as this could cause skin tears and additional skin irritation.     For questions, please contact your therapist at:  [facility] [place] [address]  [address] [place], [place], [address] Phone: [phone]   Fax:  [phone]     

## 2020-01-04 ENCOUNTER — Ambulatory Visit: Payer: No Typology Code available for payment source

## 2020-01-06 ENCOUNTER — Other Ambulatory Visit: Payer: Self-pay

## 2020-01-06 ENCOUNTER — Ambulatory Visit: Payer: No Typology Code available for payment source

## 2020-01-06 DIAGNOSIS — M62838 Other muscle spasm: Secondary | ICD-10-CM

## 2020-01-06 DIAGNOSIS — R29898 Other symptoms and signs involving the musculoskeletal system: Secondary | ICD-10-CM

## 2020-01-06 DIAGNOSIS — M6281 Muscle weakness (generalized): Secondary | ICD-10-CM

## 2020-01-06 DIAGNOSIS — R262 Difficulty in walking, not elsewhere classified: Secondary | ICD-10-CM

## 2020-01-06 DIAGNOSIS — M25552 Pain in left hip: Secondary | ICD-10-CM | POA: Diagnosis not present

## 2020-01-06 NOTE — Therapy (Signed)
[facility] Outpatient Rehabilitation [facility] [address]  [address] [place], [place], [address] Phone: [phone]   Fax:  [phone]  Physical Therapy Treatment  Patient Details  Name: Patricia Mclean MRN: [id number] Date of Birth: 19-Mar-1971 Referring Provider (PT): Kathryne Hitch, MD   Encounter Date: 01/06/2020   PT End of Session - 01/06/20 1725    Visit Number 8    Number of Visits 13    Date for PT Re-Evaluation 01/21/20    Authorization Type [employer] - VL: 45    PT Start Time 1706    PT Stop Time 1756    PT Time Calculation (min) 50 min    Activity Tolerance Patient tolerated treatment well    Behavior During Therapy [facility] for tasks assessed/performed           Past Medical History:  Diagnosis Date  . Anxiety   . GERD (gastroesophageal reflux disease)   . Headache    metoprolol and topamx  . Pneumonia    x3 had vaccine    Past Surgical History:  Procedure Laterality Date  . TONSILLECTOMY    . TOTAL HIP ARTHROPLASTY Left 08/22/2018   Procedure: TOTAL HIP ARTHROPLASTY ANTERIOR APPROACH;  Surgeon: Jodi Geralds, MD;  Location: [facility];  Service: Orthopedics;  Laterality: Left;    There were no vitals filed for this visit.   Subjective Assessment - 01/06/20 1720    Subjective Pt. noting benefit from manual therapy in previous sessions.    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 Luiz Blare)    Patient Stated Goals "to walk (on campus & with dog), do stairs & carry handbag w/o pain; sleep on side"    Currently in Pain? Yes    Pain Score 0-No pain   pain rising to 9/10 when rolling over on it in bed   Pain Location Hip    Pain Orientation Left    Pain Descriptors / Indicators Sharp    Pain Type Chronic pain    Pain Frequency Intermittent    Multiple Pain Sites No                             [facility] Adult PT Treatment/Exercise - 01/06/20 0001      Knee/Hip Exercises: Stretches   Passive Hamstring  Stretch Left;30 seconds;2 reps    Passive Hamstring Stretch Limitations supine with strap     Hip Flexor Stretch Left;2 reps;30 seconds    Hip Flexor Stretch Limitations mod thomas with strap     ITB Stretch Left;1 rep;30 seconds    ITB Stretch Limitations supine       Knee/Hip Exercises: Aerobic   Nustep L4 x 6 min (UE/LE)      Iontophoresis   Type of Iontophoresis Dexamethasone    Location L greater trochanter    Dose 80 mA-min, 1.0 mL    Time 4-6 hr patch (#4 of 6)      Manual Therapy   Manual Therapy Soft tissue mobilization;Myofascial release    Manual therapy comments sidelying     Soft tissue mobilization DTM to L glute med, TFL, piriformis, glute complex     Myofascial Release Manual TPR to L piriformis       Kinesiotix   Create Space 30% I-strip along length of mid ITB to sight of ionto patch near GT    Inhibit Muscle  30-50% strip along L piriformis r  PT Short Term Goals - 01/01/20 1111      PT SHORT TERM GOAL #1   Title Patient will be independent with initial HEP    Status Achieved   12/15/19     PT SHORT TERM GOAL #2   Title Patient to report pain reduction in frequency and intensity by >/= 25%    Status Achieved   7/19 - pt reporting 20-25% reduction in pain with walking            PT Long Term Goals - 12/15/19 1107      PT LONG TERM GOAL #1   Title Patient will be independent with ongoing/advanced HEP    Status On-going    Target Date 01/21/20      PT LONG TERM GOAL #2   Title Patient to report pain reduction in frequency and intensity by >/= 50%    Status On-going    Target Date 01/21/20      PT LONG TERM GOAL #3   Title Patient will demonstrate improved L hip strength to >/= 4+/5 for improved stability and ease of mobility    Status On-going    Target Date 01/21/20      PT LONG TERM GOAL #4   Title Patient will improve walking tolerance to >/= 30 minutes w/o pain interference to allow her to walk across campus or  take her dog for a walk    Status On-going    Target Date 01/21/20      PT LONG TERM GOAL #5   Title Patient will negotiate stairs reciprocally with normal step pattern w/o limitation due to L hip pain or weakness    Status On-going    Target Date 01/21/20                 Plan - 01/06/20 1726    Clinical Impression Statement Patricia Mclean reporting good benefit from DN and taping to L hip last session.  Reapplied L piriformis with Rock Tape as no skin irritation visible.  MT focused on improving tissue quality and reducing tension in L lute complex, piriformis, glute Medius.  Pt. feels she is improving with walking form/tolerance however does still note L GT still too tender to lay on in bed.  Ended visit with pt. reporting she was pain free.    Comorbidities L THA 08/22/18 secondary to AVN, R hip AVN, anxiety, depression    PT Treatment/Interventions ADLs/Self Care Home Management;Cryotherapy;Electrical Stimulation;Iontophoresis 4mg /ml Dexamethasone;Moist Heat;Ultrasound;Gait training;Stair training;Functional mobility training;Therapeutic activities;Therapeutic exercise;Balance training;Neuromuscular re-education;Patient/family education;Manual techniques;Scar mobilization;Passive range of motion;Dry needling;Taping    PT Next Visit Plan assess response to Ktape and ionto patch #4/6; L hip flexibility & strengthening; manual therapy with potential DN to address abnormal muscle tension in TLF, glutes, quad and ITB; modalities including ionto patch #4 as benefit noted    PT Home Exercise Plan 7/1 - HS, ITB, glute/piriformis stretches; 7/8 - s/l clam, hip ABD/ext, hip flex/ext, front/back tap, CW/CCW circles, supine red TB clam, bridge + ABD isometric    Consulted and Agree with Plan of Care Patient           Patient will benefit from skilled therapeutic intervention in order to improve the following deficits and impairments:  Abnormal gait, Decreased activity tolerance, Decreased endurance,  Decreased mobility, Decreased range of motion, Decreased scar mobility, Decreased strength, Difficulty walking, Increased fascial restricitons, Increased muscle spasms, Impaired flexibility, Pain  Visit Diagnosis: Pain in left hip  Difficulty in walking, not elsewhere classified  Other  symptoms and signs involving the musculoskeletal system  Other muscle spasm  Muscle weakness (generalized)     Problem List Patient Active Problem List   Diagnosis Date Noted  . Avascular necrosis of hip, left (HCC) 08/22/2018  . Avascular necrosis of femoral head, left (HCC) 08/22/2018    Kermit Balo, PTA 01/06/20 6:15 PM   [facility] Outpatient Rehabilitation [facility] [address]  [address] [place], [place], [address] Phone: [phone]   Fax:  [phone]  Name: Patricia Mclean MRN: [id number] Date of Birth: 01-17-1971

## 2020-01-07 ENCOUNTER — Ambulatory Visit: Payer: PRIVATE HEALTH INSURANCE | Admitting: Orthopaedic Surgery

## 2020-01-11 ENCOUNTER — Encounter: Payer: Self-pay | Admitting: Physical Therapy

## 2020-01-11 ENCOUNTER — Other Ambulatory Visit: Payer: Self-pay

## 2020-01-11 ENCOUNTER — Ambulatory Visit: Payer: No Typology Code available for payment source | Attending: Orthopaedic Surgery | Admitting: Physical Therapy

## 2020-01-11 DIAGNOSIS — R29898 Other symptoms and signs involving the musculoskeletal system: Secondary | ICD-10-CM

## 2020-01-11 DIAGNOSIS — M62838 Other muscle spasm: Secondary | ICD-10-CM | POA: Diagnosis present

## 2020-01-11 DIAGNOSIS — M25552 Pain in left hip: Secondary | ICD-10-CM

## 2020-01-11 DIAGNOSIS — M6281 Muscle weakness (generalized): Secondary | ICD-10-CM | POA: Diagnosis present

## 2020-01-11 DIAGNOSIS — R262 Difficulty in walking, not elsewhere classified: Secondary | ICD-10-CM | POA: Diagnosis present

## 2020-01-11 NOTE — Therapy (Signed)
[facility] Outpatient Rehabilitation [facility] [address]  [address] [place], [place], [address] Phone: [phone]   Fax:  [phone]  Physical Therapy Treatment  Patient Details  Name: Patricia Mclean MRN: [id number] Date of Birth: 1971/01/23 Referring Provider (PT): Kathryne Hitch, MD   Encounter Date: 01/11/2020   PT End of Session - 01/11/20 1708    Visit Number 9    Number of Visits 13    Date for PT Re-Evaluation 01/21/20    Authorization Type [employer] - VL: 45    PT Start Time 1708    PT Stop Time 1805    PT Time Calculation (min) 57 min    Activity Tolerance Patient tolerated treatment well    Behavior During Therapy [facility] for tasks assessed/performed           Past Medical History:  Diagnosis Date  . Anxiety   . GERD (gastroesophageal reflux disease)   . Headache    metoprolol and topamx  . Pneumonia    x3 had vaccine    Past Surgical History:  Procedure Laterality Date  . TONSILLECTOMY    . TOTAL HIP ARTHROPLASTY Left 08/22/2018   Procedure: TOTAL HIP ARTHROPLASTY ANTERIOR APPROACH;  Surgeon: Jodi Geralds, MD;  Location: [facility];  Service: Orthopedics;  Laterality: Left;    There were no vitals filed for this visit.   Subjective Assessment - 01/11/20 1712    Subjective Pt reports taping last Wed seemed to help keep the soreness from reeping back up over the weekend. Notes a lot of reduction in muscle tension/tightness but bursa seems slower to respond.    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 Luiz Blare)    Patient Stated Goals "to walk (on campus & with dog), do stairs & carry handbag w/o pain; sleep on side"    Currently in Pain? Yes    Pain Score 0-No pain   4/10 with touch to lateral hip   Pain Location Hip    Pain Orientation Left    Pain Descriptors / Indicators Sharp    Pain Type Chronic pain    Pain Frequency Intermittent                             [facility] Adult PT  Treatment/Exercise - 01/11/20 1708      Exercises   Exercises Knee/Hip      Knee/Hip Exercises: Aerobic   Recumbent Bike L2 x 6 min      Modalities   Modalities Iontophoresis      Iontophoresis   Type of Iontophoresis Dexamethasone    Location L greater trochanter    Dose 80 mA-min, 1.0 mL    Time 4-6 hr patch (#5 of 6)      Manual Therapy   Manual Therapy Soft tissue mobilization;Myofascial release;Taping    Manual therapy comments skilled palpation and monitoring during DN     Soft tissue mobilization STM/DTM to L lateral glutes, piriformis, TFL, proximal lateral quads & lateral HS - very ttp     Myofascial Release manual TPR to L TFL, lateral glute med/min and piriformis; pin & stretch to L lateral HS and quads - decreased ttp and bettter tolerance for TPR following DN      Kinesiotix   Create Space 30% strip along L ITB stopping distal to ionto patch    Inhibit Muscle  30-50% strip along L piriformis + 50% perpendicular strip at  area of greatest tenderness over lateral piriformis            Trigger Point Dry Needling - 01/11/20 1708    Consent Given? Yes    Muscles Treated Lower Quadrant Hamstring;Quadriceps   Lt   Muscles Treated Back/Hip Tensor fascia lata;Gluteus minimus;Gluteus medius;Piriformis   Lt   Electrical Stimulation Performed with Dry Needling Yes    E-stim with Dry Needling Details L quads, HS    Quadriceps Response Twitch response elicited;Palpable increased muscle length   Lt proximal VL   Hamstring Response Twitch response elicited;Palpable increased muscle length   Lt lateral HS   Gluteus Minimus Response Twitch response elicited;Palpable increased muscle length    Gluteus Medius Response Twitch response elicited;Palpable increased muscle length    Piriformis Response Twitch response elicited;Palpable increased muscle length    Tensor Fascia Lata Response Twitch response elicited;Palpable increased muscle length                  PT Short  Term Goals - 01/01/20 1111      PT SHORT TERM GOAL #1   Title Patient will be independent with initial HEP    Status Achieved   12/15/19     PT SHORT TERM GOAL #2   Title Patient to report pain reduction in frequency and intensity by >/= 25%    Status Achieved   7/19 - pt reporting 20-25% reduction in pain with walking            PT Long Term Goals - 12/15/19 1107      PT LONG TERM GOAL #1   Title Patient will be independent with ongoing/advanced HEP    Status On-going    Target Date 01/21/20      PT LONG TERM GOAL #2   Title Patient to report pain reduction in frequency and intensity by >/= 50%    Status On-going    Target Date 01/21/20      PT LONG TERM GOAL #3   Title Patient will demonstrate improved L hip strength to >/= 4+/5 for improved stability and ease of mobility    Status On-going    Target Date 01/21/20      PT LONG TERM GOAL #4   Title Patient will improve walking tolerance to >/= 30 minutes w/o pain interference to allow her to walk across campus or take her dog for a walk    Status On-going    Target Date 01/21/20      PT LONG TERM GOAL #5   Title Patient will negotiate stairs reciprocally with normal step pattern w/o limitation due to L hip pain or weakness    Status On-going    Target Date 01/21/20                 Plan - 01/11/20 1805    Clinical Impression Statement Patricia Mclean reports less pain with mobility and walking but remains very sensitive to touch over L lateral hip/greater trochanter. She notes kinesiotaping seemed to help her soreness/pain from worsening as much over the weekend between PT visits. She continues be painful to touch over much of L lateral hip and upper leg musculature with increased muscle tension, taut bands and TPs identified in L lateral HS, ITB, proximal VL, TFL as well as lateral glutes and piriformis - addressed with further manual therapy incorporating DN with resultant reduction in ttp and muscle tension. Session  concluded with 5th ionto patch application along with RockTape application to L ITB and  piriformis.    Comorbidities L THA 08/22/18 secondary to AVN, R hip AVN, anxiety, depression    PT Frequency 2x / week    PT Duration 6 weeks    PT Treatment/Interventions ADLs/Self Care Home Management;Cryotherapy;Electrical Stimulation;Iontophoresis 4mg /ml Dexamethasone;Moist Heat;Ultrasound;Gait training;Stair training;Functional mobility training;Therapeutic activities;Therapeutic exercise;Balance training;Neuromuscular re-education;Patient/family education;Manual techniques;Scar mobilization;Passive range of motion;Dry needling;Taping    PT Next Visit Plan 10th visit/MD progress note for appt 8/5; L hip flexibility & strengthening; manual therapy with potential DN to address abnormal muscle tension in TLF, glutes, quad and ITB; kinesiotaping as continued benefit noted; modalities including ionto patch #6 as benefit noted    PT Home Exercise Plan 7/1 - HS, ITB, glute/piriformis stretches; 7/8 - s/l clam, hip ABD/ext, hip flex/ext, front/back tap, CW/CCW circles, supine red TB clam, bridge + ABD isometric    Consulted and Agree with Plan of Care Patient           Patient will benefit from skilled therapeutic intervention in order to improve the following deficits and impairments:  Abnormal gait, Decreased activity tolerance, Decreased endurance, Decreased mobility, Decreased range of motion, Decreased scar mobility, Decreased strength, Difficulty walking, Increased fascial restricitons, Increased muscle spasms, Impaired flexibility, Pain  Visit Diagnosis: Pain in left hip  Difficulty in walking, not elsewhere classified  Other symptoms and signs involving the musculoskeletal system  Other muscle spasm  Muscle weakness (generalized)     Problem List Patient Active Problem List   Diagnosis Date Noted  . Avascular necrosis of hip, left (HCC) 08/22/2018  . Avascular necrosis of femoral head, left  (HCC) 08/22/2018    08/24/2018, PT, MPT 01/11/2020, 7:07 PM  [facility] [address]  [address] [place], [place], [place] Phone: [phone]   Fax:  [phone]  Name: Patricia AMBROCIO MRN: Felipa Eth Date of Birth: 04/24/1971

## 2020-01-13 ENCOUNTER — Ambulatory Visit: Payer: No Typology Code available for payment source

## 2020-01-13 ENCOUNTER — Other Ambulatory Visit: Payer: Self-pay

## 2020-01-13 DIAGNOSIS — R262 Difficulty in walking, not elsewhere classified: Secondary | ICD-10-CM

## 2020-01-13 DIAGNOSIS — M6281 Muscle weakness (generalized): Secondary | ICD-10-CM

## 2020-01-13 DIAGNOSIS — R29898 Other symptoms and signs involving the musculoskeletal system: Secondary | ICD-10-CM

## 2020-01-13 DIAGNOSIS — M25552 Pain in left hip: Secondary | ICD-10-CM

## 2020-01-13 DIAGNOSIS — M62838 Other muscle spasm: Secondary | ICD-10-CM

## 2020-01-13 NOTE — Therapy (Signed)
[facility] [address]  [place], [place], [address] Phone: [phone]   Fax:  [phone]  Physical Therapy Treatment  Patient Details  Name: Patricia Mclean MRN: [id number] Date of Birth: 12-Oct-1970 Referring Provider (PT): Mcarthur Rossetti, MD   Encounter Date: 01/13/2020   PT End of Session - 01/13/20 1710    Visit Number 10    Number of Visits 13    Date for PT Re-Evaluation 01/21/20    Authorization Type [employer] - VL: 21    PT Start Time 2633    PT Stop Time 1755    PT Time Calculation (min) 57 min    Activity Tolerance Patient tolerated treatment well    Behavior During Therapy [facility] for tasks assessed/performed           Past Medical History:  Diagnosis Date  . Anxiety   . GERD (gastroesophageal reflux disease)   . Headache    metoprolol and topamx  . Pneumonia    x3 had vaccine    Past Surgical History:  Procedure Laterality Date  . TONSILLECTOMY    . TOTAL HIP ARTHROPLASTY Left 08/22/2018   Procedure: TOTAL HIP ARTHROPLASTY ANTERIOR APPROACH;  Surgeon: Dorna Leitz, MD;  Location: [facility];  Service: Orthopedics;  Laterality: Left;    There were no vitals filed for this visit.   Subjective Assessment - 01/13/20 1714    Subjective Noting benefit from DN.    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 Berenice Primas)    Patient Stated Goals "to walk (on campus & with dog), do stairs & carry handbag w/o pain; sleep on side"    Currently in Pain? No/denies    Pain Score 0-No pain   Pain at most rising to 5/10 pain at most   Pain Location Hip    Pain Orientation Left    Pain Descriptors / Indicators Sharp    Pain Type Chronic pain    Pain Radiating Towards down to mid lateral    Pain Frequency Intermittent    Multiple Pain Sites No              [facility] PT Assessment - 01/13/20 0001      Assessment   Medical Diagnosis L hip trochanteric bursitis    Referring Provider (PT)  Mcarthur Rossetti, MD    Onset Date/Surgical Date 08/22/18    Hand Dominance Right    Next MD Visit 01/14/20    Prior Therapy primarily virtual PT following THR      Strength   Right/Left Hip Left    Right Hip Flexion 4+/5    Right Hip Extension 4+/5    Right Hip External Rotation  4+/5   pain in L lateral hip on max resistance    Right Hip Internal Rotation 5/5    Right Hip ABduction 4+/5    Right Hip ADduction 4+/5    Left Hip Flexion 4+/5    Left Hip Extension 4/5    Left Hip External Rotation 4-/5    Left Hip Internal Rotation 4+/5    Left Hip ABduction 4+/5    Left Hip ADduction 4+/5    Right/Left Knee Right;Left    Right Knee Flexion 5/5    Right Knee Extension 5/5    Left Knee Flexion 4+/5    Left Knee Extension 5/5  [facility] Adult PT Treatment/Exercise - 01/13/20 0001      Knee/Hip Exercises: Stretches   Piriformis Stretch Left;30 seconds;4 reps    Piriformis Stretch Limitations Figure-4, KTOS      Knee/Hip Exercises: Aerobic   Recumbent Bike L2 x 6 min      Knee/Hip Exercises: Supine   Bridges with Clamshell Both;Strengthening   x 12; + isometric hip abd/ER into green TB at knees      Knee/Hip Exercises: Sidelying   Clams L clam shell x 15      Manual Therapy   Manual Therapy Soft tissue mobilization;Myofascial release;Taping    Manual therapy comments sidelying     Soft tissue mobilization DTM to L lateral glutes and piriformis - TPs x 2 in piri, medius     Myofascial Release TPR to L glute med, [employer] 30% strip along L ITB stopping distal to ionto patch    Inhibit Muscle  30-50% strip along L piriformis + 50% perpendicular strip at area of greatest tenderness over lateral piriformis                    PT Short Term Goals - 01/01/20 1111      PT SHORT TERM GOAL #1   Title Patient will be independent with initial HEP    Status Achieved   12/15/19      PT SHORT TERM GOAL #2   Title Patient to report pain reduction in frequency and intensity by >/= 25%    Status Achieved   7/19 - pt reporting 20-25% reduction in pain with walking            PT Long Term Goals - 01/13/20 1728      PT LONG TERM GOAL #1   Title Patient will be independent with ongoing/advanced HEP    Status Partially Met   met for current     PT LONG TERM GOAL #2   Title Patient to report pain reduction in frequency and intensity by >/= 50%    Status Achieved   01/13/20:  Pt. noting improvement in frequency of L hip pain 60%; 50% improvement in intensity of pain     PT LONG TERM GOAL #3   Title Patient will demonstrate improved L hip strength to >/= 4+/5 for improved stability and ease of mobility    Status Partially Met      PT LONG TERM GOAL #4   Title Patient will improve walking tolerance to >/= 30 minutes w/o pain interference to allow her to walk across campus or take her dog for a walk    Status Partially Met   08/04: 20 min     PT LONG TERM GOAL #5   Title Patient will negotiate stairs reciprocally with normal step pattern w/o limitation due to L hip pain or weakness    Status Partially Met   08/04: some remaining pain at L hip ascending                Plan - 01/13/20 1712    Clinical Impression Statement Patricia Mclean has made good progress with physical therapy.  Notes 50% overall improvement in pain intensity and frequency and reduce pain with transfers, walking and navigating stairs.  LTG #2 achieved.  Has partially met LTG #3 L hip strength goal demonstrating improved L hip abduction, adduction, flexion strength with MMT however still limited strength with extension,  ER with L lateral hip pain reported on resistance.  Pt. reporting L hip irritation and fatigue sets in around 20 min of walking.  LTG # 4 partially achieved.  Reports improvement in L hip pain with climbing stairs however notes, "I'm not there yet".  LTG #5. Partially met.  Does still  have point tenderness over L GT and still with significant tension in L lateral glute/hip muscular which has been addressed with DN, Rock Taping, and DTM with good relief noted.  Pt. directed to HEP strengthening activities to target remaining strength deficits at L hip and will see MD tomorrow for f/u.  Patricia Mclean will continue to benefit from further skilled therapy to improved L hip tissue quality, improved strength and stability for improved quality of life.    Comorbidities L THA 08/22/18 secondary to AVN, R hip AVN, anxiety, depression    Rehab Potential Good    PT Frequency 2x / week    PT Treatment/Interventions ADLs/Self Care Home Management;Cryotherapy;Electrical Stimulation;Iontophoresis 31m/ml Dexamethasone;Moist Heat;Ultrasound;Gait training;Stair training;Functional mobility training;Therapeutic activities;Therapeutic exercise;Balance training;Neuromuscular re-education;Patient/family education;Manual techniques;Scar mobilization;Passive range of motion;Dry needling;Taping    PT Next Visit Plan FOTO; L hip flexibility & strengthening; manual therapy with potential DN to address abnormal muscle tension in TLF, glutes, quad and ITB; kinesiotaping as continued benefit noted; modalities including ionto patch #6 as benefit noted    PT Home Exercise Plan 7/1 - HS, ITB, glute/piriformis stretches; 7/8 - s/l clam, hip ABD/ext, hip flex/ext, front/back tap, CW/CCW circles, supine red TB clam, bridge + ABD isometric    Consulted and Agree with Plan of Care Patient           Patient will benefit from skilled therapeutic intervention in order to improve the following deficits and impairments:  Abnormal gait, Decreased activity tolerance, Decreased endurance, Decreased mobility, Decreased range of motion, Decreased scar mobility, Decreased strength, Difficulty walking, Increased fascial restricitons, Increased muscle spasms, Impaired flexibility, Pain  Visit Diagnosis: Pain in left hip  Difficulty  in walking, not elsewhere classified  Other symptoms and signs involving the musculoskeletal system  Other muscle spasm  Muscle weakness (generalized)     Problem List Patient Active Problem List   Diagnosis Date Noted  . Avascular necrosis of hip, left ([facility] 08/22/2018  . Av[place] of [place], [place] ([place] 08/22/2018    MBess Mclean PTA 01/13/20 6:15 PM   [facility] [address] [place] [address]Phone: [phone]  Fax:  [phone] Name: Patricia PHILLIPSONMRN: [id number] of Birth: [birth year]

## 2020-01-14 ENCOUNTER — Ambulatory Visit: Payer: PRIVATE HEALTH INSURANCE | Admitting: Orthopaedic Surgery

## 2020-01-14 ENCOUNTER — Encounter: Payer: Self-pay | Admitting: Orthopaedic Surgery

## 2020-01-14 DIAGNOSIS — Z96642 Presence of left artificial hip joint: Secondary | ICD-10-CM

## 2020-01-14 DIAGNOSIS — M7062 Trochanteric bursitis, left hip: Secondary | ICD-10-CM | POA: Diagnosis not present

## 2020-01-14 MED ORDER — METHYLPREDNISOLONE ACETATE 40 MG/ML IJ SUSP
40.0000 mg | INTRAMUSCULAR | Status: AC | PRN
  Administered 2020-01-14: 40 mg via INTRA_ARTICULAR

## 2020-01-14 MED ORDER — LIDOCAINE HCL 1 % IJ SOLN
3.0000 mL | INTRAMUSCULAR | Status: AC | PRN
  Administered 2020-01-14: 3 mL

## 2020-01-14 NOTE — Progress Notes (Signed)
Office Visit Note   Patient: Patricia Mclean           Date of Birth: [birth year]           MRN: [id number] Visit Date: 01/14/2020              Requested by: Mosetta Putt, MD [address] [place],  [place] [address] PCP: Mosetta Putt, MD   Assessment & Plan: Visit Diagnoses:  1. Trochanteric bursitis, left hip   2. History of total left hip replacement     Plan: We did collectively decided to try 1 more steroid injection of the trochanteric area and I placed this on the left side without difficulty.  I gave her a prescription for continuing 6 more weeks of intensive physical therapy on her right hip.  At some point she can always have another steroid injection in 3 to 4 months if needed.  Another step would be considering a left trochanteric bursectomy and IT band lengthening if needed.  All question concerns were answered and addressed.  Follow-Up Instructions: Return if symptoms worsen or fail to improve.   Orders:  No orders of the defined types were placed in this encounter.  No orders of the defined types were placed in this encounter.     Procedures: Large Joint Inj: L greater trochanter on 01/14/2020 5:35 PM Indications: pain and diagnostic evaluation Details: 22 G 1.5 in needle, lateral approach  Arthrogram: No  Medications: 3 mL lidocaine 1 %; 40 mg methylPREDNISolone acetate 40 MG/ML Outcome: tolerated well, no immediate complications Procedure, treatment alternatives, risks and benefits explained, specific risks discussed. Consent was given by the patient. Immediately prior to procedure a time out was called to verify the correct patient, procedure, equipment, support staff and site/side marked as required. Patient was prepped and draped in the usual sterile fashion.       Clinical Data: No additional findings.   Subjective: Chief Complaint  Patient presents with  . Left Hip - Follow-up  The patient is a 49 year old well-known to me.  She  had a total hip arthroplasty on her left hip done through direct anterior approach by one of my colleagues in town in March 2020.  Unfortunately she is dealt with significant and severe trochanteric bursitis since that surgery.  She had terrible avascular necrosis in her hip but had to wait a while before surgery due to her husband needing to have surgery.  I have at least injected the trochanteric area once in February of this year.  She had an MRI of the hip that showed no complicating features.  She is now going through physical therapy which has been more personalized for her which has been good.  They have been doing dry needling or other things for her and is finally making progress.  Therapy is requesting 6 more weeks and I agree with this.  The patient is hoping to have this as well.  We talked about the possibility of a steroid injection again today since been 6 months since her last injection.  HPI  Review of Systems  There is currently no active medical issues and no chest pain, shortness of breath, fever, chills, nausea, vomiting Objective: Vital Signs: There were no vitals taken for this visit.  Physical Exam She is alert and orient x3 and in no acute distress Ortho Exam Examination of her left hip shows still pain over the trochanteric area and she is very sensitive in this area but slightly less  than she has been before.  There is improvements on my exam from having seen her multiple times. Specialty Comments:  No specialty comments available.  Imaging: No results found.   PMFS History: Patient Active Problem List   Diagnosis Date Noted  . Avascular necrosis of hip, left (HCC) 08/22/2018  . Avascular necrosis of femoral head, left ([facility]) 08/22/2018   Past Medical History:  Diagnosis Date  . Anxiety   . GERD (gastroesophageal reflux disease)   . Headache    metoprolol and topamx  . Pneumonia    x3 had vaccine    History reviewed. No pertinent family history.  Past  Surgical History:  Procedure Laterality Date  . TONSILLECTOMY    . TOTAL HIP ARTHROPLASTY Left 08/22/2018   Procedure: TOTAL HIP ARTHROPLASTY ANTERIOR APPROACH;  Surgeon: Jodi Geralds, MD;  Location: [facility];  Service: Orthopedics;  Laterality: Left;   Social History   Occupational History  . Not on file  Tobacco Use  . Smoking status: Never Smoker  . Smokeless tobacco: Never Used  Vaping Use  . Vaping Use: Never used  Substance and Sexual Activity  . Alcohol use: Yes    Alcohol/week: 1.0 standard drink    Types: 1 Glasses of wine per week    Comment: occasional  wine with dinner  . Drug use: Never  . Sexual activity: Yes

## 2020-01-18 ENCOUNTER — Encounter: Payer: Self-pay | Admitting: Physical Therapy

## 2020-01-18 ENCOUNTER — Ambulatory Visit: Payer: No Typology Code available for payment source | Admitting: Physical Therapy

## 2020-01-18 ENCOUNTER — Other Ambulatory Visit: Payer: Self-pay

## 2020-01-18 DIAGNOSIS — M25552 Pain in left hip: Secondary | ICD-10-CM | POA: Diagnosis not present

## 2020-01-18 DIAGNOSIS — M62838 Other muscle spasm: Secondary | ICD-10-CM

## 2020-01-18 DIAGNOSIS — R262 Difficulty in walking, not elsewhere classified: Secondary | ICD-10-CM

## 2020-01-18 DIAGNOSIS — M6281 Muscle weakness (generalized): Secondary | ICD-10-CM

## 2020-01-18 DIAGNOSIS — R29898 Other symptoms and signs involving the musculoskeletal system: Secondary | ICD-10-CM

## 2020-01-18 NOTE — Therapy (Signed)
[facility] [place] [address]  [place], [place], [address] Phone: [phone]   Fax:  [phone]  Physical Therapy Treatment / Recert  Patient Details  Name: Patricia Mclean MRN: [id number] Date of Birth: 1970/09/19 Referring Provider (PT): Mcarthur Rossetti, MD   Encounter Date: 01/18/2020   PT End of Session - 01/18/20 1704    Visit Number 11    Number of Visits 25    Date for PT Re-Evaluation 03/07/20    Authorization Type [employer] - VL: 54    PT Start Time 6226    PT Stop Time 1751    PT Time Calculation (min) 47 min    Activity Tolerance Patient tolerated treatment well    Behavior During Therapy [facility] for tasks assessed/performed           Past Medical History:  Diagnosis Date  . Anxiety   . GERD (gastroesophageal reflux disease)   . Headache    metoprolol and topamx  . Pneumonia    x3 had vaccine    Past Surgical History:  Procedure Laterality Date  . TONSILLECTOMY    . TOTAL HIP ARTHROPLASTY Left 08/22/2018   Procedure: TOTAL HIP ARTHROPLASTY ANTERIOR APPROACH;  Surgeon: Dorna Leitz, MD;  Location: [facility];  Service: Orthopedics;  Laterality: Left;    There were no vitals filed for this visit.   Subjective Assessment - 01/18/20 1709    Subjective Pt reports MD wants her to continue with PT x 6 more weeks - new referral sent. She states MD felt like muscles were improving, but since bursa remains inflammed, he went ahead with an injection to the bursa - not some minor relief so far. Still having some soreness from assessment at MD office.    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 Berenice Primas)    Patient Stated Goals "to walk (on campus & with dog), do stairs & carry handbag w/o pain; sleep on side"    Currently in Pain? Yes    Pain Score 6     Pain Location Hip    Pain Orientation Left    Pain Descriptors / Indicators Sore    Pain Type Chronic pain    Pain Frequency Intermittent               [facility] PT Assessment - 01/18/20 1704      Assessment   Medical Diagnosis L hip trochanteric bursitis    Referring Provider (PT) Mcarthur Rossetti, MD    Onset Date/Surgical Date 08/22/18    Hand Dominance Right    Next MD Visit TBD      Prior Function   Level of Independence Independent    Vocation Full time employment    Vocation Requirements VP at [employer] traveling, walking the dog, cooking, had been working with personal trainer prior to surgery      Observation/Other Assessments   Focus on Therapeutic Outcomes (FOTO)  Hip 36% (64% limitation)                         [facility] Adult PT Treatment/Exercise - 01/18/20 1704      Self-Care   Self-Care Other Self-Care Comments    Other Self-Care Comments  Review of home TENS unit set-up and adjustment.      Exercises   Exercises Knee/Hip      Knee/Hip Exercises: Aerobic   Nustep L4 x 6 min (  UE/LE)      Knee/Hip Exercises: Standing   Hip Flexion Left;10 reps;Stengthening;Knee straight    Hip Flexion Limitations red TB; UE support on back of chair for balance    Hip ADduction Left;10 reps;Strengthening    Hip ADduction Limitations red TB; UE support on back of chair for balance    Hip Abduction Left;10 reps;Stengthening;Knee straight    Abduction Limitations red TB; UE support on back of chair for balance    Hip Extension Left;10 reps;Stengthening;Knee straight    Extension Limitations red TB; UE support on back of chair for balance                  PT Education - 01/18/20 1751    Education Details HEP update - standing 4-way SLR with red TB; Review of TENS set-up and adjustment    Person(s) Educated Patient    Methods Explanation;Demonstration;Handout    Comprehension Verbalized understanding;Returned demonstration;Need further instruction            PT Short Term Goals - 01/01/20 1111      PT SHORT TERM GOAL #1   Title Patient will be independent with initial HEP      Status Achieved   12/15/19     PT SHORT TERM GOAL #2   Title Patient to report pain reduction in frequency and intensity by >/= 25%    Status Achieved   7/19 - pt reporting 20-25% reduction in pain with walking            PT Long Term Goals - 01/18/20 1716      PT LONG TERM GOAL #1   Title Patient will be independent with ongoing/advanced HEP    Status Partially Met   01/18/20: met for current HEP (updated today)   Target Date 03/07/20      PT LONG TERM GOAL #2   Title Patient to report pain reduction in frequency and intensity by >/= 75%    Status Revised   Prior goal: 50% improvement - met as of 01/13/20 (60% reduction in frequency of L hip pain & 50% improvement in intensity of pain)   Target Date 03/07/20      PT LONG TERM GOAL #3   Title Patient will demonstrate improved L hip strength to >/= 4+/5 for improved stability and ease of mobility    Status Partially Met    Target Date 03/07/20      PT LONG TERM GOAL #4   Title Patient will improve walking tolerance to >/= 30 minutes w/o pain interference to allow her to walk across campus or take her dog for a walk    Status Partially Met   01/13/20: 20 min   Target Date 03/07/20      PT LONG TERM GOAL #5   Title Patient will negotiate stairs reciprocally with normal step pattern w/o limitation due to L hip pain or weakness    Status Partially Met   01/13/20: some remaining pain at L hip ascending   Target Date 03/07/20                 Plan - 01/18/20 1718    Clinical Impression Statement Patricia Mclean reports MD pleased with her progress with her strength and wants her to continue with PT for another 6 weeks to continue to address the L hip bursitis. She notes increased L hip soreness from motions performed during MD assessment at office visit and states that he did give her an injection in  the bursa with some initial relief noted but would not be able to give her another injection until December if it were to be necessary.  Updated HEP today with inclusion of standing 4-way hip SLR with red TB for strengthening with pt instructed to alternate new exercises with existing HEP exercises. Remainder of session focused on review of setup and use of home TENS unit including intensity and parameter adjustment per pt request as she reported difficulty with getting the unit to work at home.  Per MD order, will plan for recert for additional 2x/wk x 6 weeks.    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/18 secondary to AVN, R hip AVN, anxiety, depression    Examination-Activity Limitations Sit;Stand;Locomotion Level;Stairs;Transfers    Examination-Participation Restrictions Community Activity;Occupation;Shop    Rehab Potential Good    PT Frequency 2x / week    PT Duration 6 weeks    PT Treatment/Interventions ADLs/Self Care Home Management;Cryotherapy;Electrical Stimulation;Iontophoresis 74m/ml Dexamethasone;Moist Heat;Ultrasound;Gait training;Stair training;Functional mobility training;Therapeutic activities;Therapeutic exercise;Balance training;Neuromuscular re-education;Patient/family education;Manual techniques;Scar mobilization;Passive range of motion;Dry needling;Taping    PT Next Visit Plan L hip flexibility & strengthening; manual therapy with potential DN to address abnormal muscle tension in TLF, glutes, quad and ITB; kinesiotaping as continued benefit noted; modalities including ionto patch #6 as benefit noted    PT Home Exercise Plan 7/1 - HS, ITB, glute/piriformis stretches; 7/8 - s/l clam, hip ABD/ext, hip flex/ext, front/back tap, CW/CCW circles, supine red TB clam, bridge + ABD isometric; 8/9 - red TB standing 4-way SLR    Consulted and Agree with Plan of Care Patient           Patient will benefit from skilled therapeutic intervention in order to improve the following deficits and impairments:   Abnormal gait, Decreased activity tolerance, Decreased endurance, Decreased mobility, Decreased range of motion, Decreased scar mobility, Decreased strength, Difficulty walking, Increased fascial restricitons, Increased muscle spasms, Impaired flexibility, Improper body mechanics, Pain  Visit Diagnosis: Pain in left hip - Plan: PT plan of care cert/re-cert  Difficulty in walking, not elsewhere classified - Plan: PT plan of care cert/re-cert  Other symptoms and signs involving the musculoskeletal system - Plan: PT plan of care cert/re-cert  Other muscle spasm - Plan: PT plan of care cert/re-cert  Muscle weakness (generalized) - Plan: PT plan of care cert/re-cert     Problem List Patient Active Problem List   Diagnosis Date Noted  . Avascular necrosis of hip, left ([facility] 08/22/2018  . Avascular necrosis of femoral head, left ([facility] 08/22/2018    JPercival Spanish PT, MPT 01/18/2020, 6:51 PM  [facility] [address]Phone: [phone]  Fax:  [phone] Name: Patricia IBBOTSONMRN: [id number] of Birth: 1October 13, 1972

## 2020-01-18 NOTE — Patient Instructions (Signed)
    Home exercise program created by Taber Sweetser, PT.  For questions, please contact Roselle Norton via phone at [phone] or email at [email or web address]  [facility] [place] [address]  [address] [place], [place], [address] Phone: [phone]   Fax:  [phone]    

## 2020-01-21 ENCOUNTER — Other Ambulatory Visit: Payer: Self-pay

## 2020-01-21 ENCOUNTER — Ambulatory Visit: Payer: No Typology Code available for payment source

## 2020-01-21 DIAGNOSIS — M25552 Pain in left hip: Secondary | ICD-10-CM

## 2020-01-21 DIAGNOSIS — R262 Difficulty in walking, not elsewhere classified: Secondary | ICD-10-CM

## 2020-01-21 DIAGNOSIS — M62838 Other muscle spasm: Secondary | ICD-10-CM

## 2020-01-21 DIAGNOSIS — R29898 Other symptoms and signs involving the musculoskeletal system: Secondary | ICD-10-CM

## 2020-01-21 DIAGNOSIS — M6281 Muscle weakness (generalized): Secondary | ICD-10-CM

## 2020-01-21 NOTE — Therapy (Signed)
[facility] [place] [address]  [place], [place], [address] Phone: [phone]   Fax:  [phone]  Physical Therapy Treatment  Patient Details  Name: Patricia Mclean MRN: [id number] Date of Birth: 06-15-1970 Referring Provider (PT): Mcarthur Rossetti, MD   Encounter Date: 01/21/2020   PT End of Session - 01/21/20 1718    Visit Number 12    Number of Visits 25    Date for PT Re-Evaluation 03/07/20    Authorization Type [employer] - VL: 52    PT Start Time 1703    PT Stop Time 5093    PT Time Calculation (min) 50 min    Activity Tolerance Patient tolerated treatment well    Behavior During Therapy [facility] for tasks assessed/performed           Past Medical History:  Diagnosis Date  . Anxiety   . GERD (gastroesophageal reflux disease)   . Headache    metoprolol and topamx  . Pneumonia    x3 had vaccine    Past Surgical History:  Procedure Laterality Date  . TONSILLECTOMY    . TOTAL HIP ARTHROPLASTY Left 08/22/2018   Procedure: TOTAL HIP ARTHROPLASTY ANTERIOR APPROACH;  Surgeon: Dorna Leitz, MD;  Location: [facility];  Service: Orthopedics;  Laterality: Left;    There were no vitals filed for this visit.   Subjective Assessment - 01/21/20 1716    Subjective Pt. noting she still has pain going up stairs.    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 Berenice Primas)    Patient Stated Goals "to walk (on campus & with dog), do stairs & carry handbag w/o pain; sleep on side"    Currently in Pain? No/denies    Pain Score 0-No pain   pain rising 5/10 going up stairs   Pain Location Hip    Pain Orientation Left    Pain Descriptors / Indicators Sore    Pain Type Chronic pain    Multiple Pain Sites No                             [facility] Adult PT Treatment/Exercise - 01/21/20 0001      Knee/Hip Exercises: Stretches   Hip Flexor Stretch Left;2 reps;30 seconds    Hip Flexor Stretch Limitations  mod thomas with strap     Piriformis Stretch Left;30 seconds;2 reps    Piriformis Stretch Limitations Figure-4, KTOS    Other Knee/Hip Stretches L TFL stretch sidelying 3 x 30 sec       Knee/Hip Exercises: Aerobic   Recumbent Bike L2 x 6 min      Knee/Hip Exercises: Supine   Bridges with Clamshell Both;15 reps;Strengthening   Manual resistance into IR with L hip ER push      Knee/Hip Exercises: Sidelying   Clams L clam shell x 10   3# cuffweight at knee      Manual Therapy   Manual Therapy Soft tissue mobilization;Myofascial release    Manual therapy comments sidelying     Soft tissue mobilization DTM to L TFL, glutes, piriformis     Myofascial Release TPR to L piriformis     Kinesiotex Create Space      Kinesiotix   Create Space 30% strip along L ITB stopping distal to ionto patch                    PT Short  Term Goals - 01/01/20 1111      PT SHORT TERM GOAL #1   Title Patient will be independent with initial HEP    Status Achieved   12/15/19     PT SHORT TERM GOAL #2   Title Patient to report pain reduction in frequency and intensity by >/= 25%    Status Achieved   7/19 - pt reporting 20-25% reduction in pain with walking            PT Long Term Goals - 01/18/20 1716      PT LONG TERM GOAL #1   Title Patient will be independent with ongoing/advanced HEP    Status Partially Met   01/18/20: met for current HEP (updated today)   Target Date 03/07/20      PT LONG TERM GOAL #2   Title Patient to report pain reduction in frequency and intensity by >/= 75%    Status Revised   Prior goal: 50% improvement - met as of 01/13/20 (60% reduction in frequency of L hip pain & 50% improvement in intensity of pain)   Target Date 03/07/20      PT LONG TERM GOAL #3   Title Patient will demonstrate improved L hip strength to >/= 4+/5 for improved stability and ease of mobility    Status Partially Met    Target Date 03/07/20      PT LONG TERM GOAL #4   Title Patient will  improve walking tolerance to >/= 30 minutes w/o pain interference to allow her to walk across campus or take her dog for a walk    Status Partially Met   01/13/20: 20 min   Target Date 03/07/20      PT LONG TERM GOAL #5   Title Patient will negotiate stairs reciprocally with normal step pattern w/o limitation due to L hip pain or weakness    Status Partially Met   01/13/20: some remaining pain at L hip ascending   Target Date 03/07/20                 Plan - 01/21/20 1756    Clinical Impression Statement Patricia Mclean with complaint to start session of L TFL, hip flexor tightness/pain without known trigger.  Addressed this with MT and Rock Taping with good relief noted.  Duration of session focused on targeted L hip extension, ER strengthening to pt. tolerance along with proximal LE stretching.  Ended visit with pt. noting improved comfort.    Comorbidities L THA 08/22/18 secondary to AVN, R hip AVN, anxiety, depression    Rehab Potential Good    PT Frequency 2x / week    PT Treatment/Interventions ADLs/Self Care Home Management;Cryotherapy;Electrical Stimulation;Iontophoresis 34m/ml Dexamethasone;Moist Heat;Ultrasound;Gait training;Stair training;Functional mobility training;Therapeutic activities;Therapeutic exercise;Balance training;Neuromuscular re-education;Patient/family education;Manual techniques;Scar mobilization;Passive range of motion;Dry needling;Taping    PT Next Visit Plan L hip flexibility & strengthening; manual therapy with potential DN to address abnormal muscle tension in TLF, glutes, quad and ITB; kinesiotaping as continued benefit noted; modalities including ionto patch #6 as benefit noted    PT Home Exercise Plan 7/1 - HS, ITB, glute/piriformis stretches; 7/8 - s/l clam, hip ABD/ext, hip flex/ext, front/back tap, CW/CCW circles, supine red TB clam, bridge + ABD isometric; 8/9 - red TB standing 4-way SLR    Consulted and Agree with Plan of Care Patient           Patient  will benefit from skilled therapeutic intervention in order to improve the following deficits and impairments:  Abnormal gait, Decreased activity tolerance, Decreased endurance, Decreased mobility, Decreased range of motion, Decreased scar mobility, Decreased strength, Difficulty walking, Increased fascial restricitons, Increased muscle spasms, Impaired flexibility, Improper body mechanics, Pain  Visit Diagnosis: Pain in left hip  Difficulty in walking, not elsewhere classified  Other symptoms and signs involving the musculoskeletal system  Other muscle spasm  Muscle weakness (generalized)     Problem List Patient Active Problem List   Diagnosis Date Noted  . Avascular necrosis of hip, left ([place]) 08/22/2018  . Avascular necrosis of femoral head, left ([place]) 08/22/2018    Bess Harvest, PTA 01/21/20 6:08 PM    [facility] [place] [address]  [place], [place], [address] Phone: [phone]   Fax:  [phone]  Name: Patricia Mclean MRN: [id number] Date of Birth: 12-01-1970

## 2020-01-25 ENCOUNTER — Other Ambulatory Visit: Payer: Self-pay

## 2020-01-25 ENCOUNTER — Ambulatory Visit: Payer: No Typology Code available for payment source | Admitting: Physical Therapy

## 2020-01-25 ENCOUNTER — Encounter: Payer: Self-pay | Admitting: Physical Therapy

## 2020-01-25 DIAGNOSIS — M25552 Pain in left hip: Secondary | ICD-10-CM

## 2020-01-25 DIAGNOSIS — R262 Difficulty in walking, not elsewhere classified: Secondary | ICD-10-CM

## 2020-01-25 DIAGNOSIS — M62838 Other muscle spasm: Secondary | ICD-10-CM

## 2020-01-25 DIAGNOSIS — R29898 Other symptoms and signs involving the musculoskeletal system: Secondary | ICD-10-CM

## 2020-01-25 DIAGNOSIS — M6281 Muscle weakness (generalized): Secondary | ICD-10-CM

## 2020-01-25 NOTE — Therapy (Signed)
[facility] [address]  [place], [place], [address] Phone: [phone]   Fax:  [phone]  Physical Therapy Treatment  Patient Details  Name: Patricia Mclean MRN: [id number] Date of Birth: [birth year] Referring Provider (PT): Mcarthur Rossetti, MD   Encounter Date: 01/25/2020   PT End of Session - 01/25/20 1700    Visit Number 13    Number of Visits 25    Date for PT Re-Evaluation 03/07/20    Authorization Type [employer] - VL: 98    PT Start Time 1700    PT Stop Time 6301    PT Time Calculation (min) 54 min    Activity Tolerance Patient tolerated treatment well    Behavior During Therapy [facility] for tasks assessed/performed           Past Medical History:  Diagnosis Date  . Anxiety   . GERD (gastroesophageal reflux disease)   . Headache    metoprolol and topamx  . Pneumonia    x3 had vaccine    Past Surgical History:  Procedure Laterality Date  . TONSILLECTOMY    . TOTAL HIP ARTHROPLASTY Left 08/22/2018   Procedure: TOTAL HIP ARTHROPLASTY ANTERIOR APPROACH;  Surgeon: Dorna Leitz, MD;  Location: [facility];  Service: Orthopedics;  Laterality: Left;    There were no vitals filed for this visit.   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 She notes she stil feels awkward/apprehensive with reciprocal stair negotiation due to fear of falls.    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 Berenice Primas)    Patient Stated Goals "to walk (on campus & with dog), do stairs & carry handbag w/o pain; sleep on side"                             [facility] Adult PT Treatment/Exercise - 01/25/20 1700      Ambulation/Gait   Stairs Yes    Stairs Assistance 7: Independent    Stair Management Technique No rails;Alternating pattern    Number of Stairs 14   x 2    Height of Stairs 7    Gait Comments Reciprocal pattern on ascent and descent - increased concentration effort on ascent but good symmetrical pattern; descent w/o issues.      Exercises   Exercises Knee/Hip      Knee/Hip Exercises: Aerobic   Recumbent Bike L2 x 6 min      Manual Therapy   Manual Therapy Soft tissue mobilization;Myofascial release;Taping    Manual therapy comments skilled palpation and monitoring during DN     Soft tissue mobilization STM/DTM to L lateral glutes, piriformis, TFL, proximal lateral quads & lateral HS - very ttp     Myofascial Release manual TPR to L TFL, lateral glute med/min and piriformis; pin & stretch to L piriformis, lateral HS and quads - decreased ttp and bettter tolerance for TPR following DN      Kinesiotix   Create Space 30% along ITB from insertion to posterior & proximal to greater trochanter (avoiding area of minor skin irritation from previous taping)   green sensitive skin tape   Inhibit Muscle  30%-50% glute medius pattern - 2 strips: posterior lip of iliac crest to just distal to greater trochanter (avoiding area  of minor skin irritation from previous taping) & ASIS to just distal to greater trochanter (avoiding area of minor skin irritation from previous taping)            Trigger Point Dry Needling - 01/25/20 1700    Consent Given? Yes    Muscles Treated Lower Quadrant Hamstring;Vastus lateralis   Lt   Muscles Treated Back/Hip Tensor fascia lata;Gluteus minimus;Gluteus medius;Piriformis   Lt   Electrical Stimulation Performed with Dry Needling Yes    E-stim with Dry Needling Details L glutes & HS    Vastus lateralis Response Twitch response elicited;Palpable increased muscle length    Hamstring Response Twitch response elicited;Palpable increased muscle length   Lt lateral HS   Gluteus Minimus Response Twitch response elicited;Palpable increased muscle length    Gluteus Medius Response Twitch response elicited;Palpable increased muscle  length    Piriformis Response Twitch response elicited;Palpable increased muscle length    Tensor Fascia Lata Response Twitch response elicited;Palpable increased muscle length                  PT Short Term Goals - 01/01/20 1111      PT SHORT TERM GOAL #1   Title Patient will be independent with initial HEP    Status Achieved   12/15/19     PT SHORT TERM GOAL #2   Title Patient to report pain reduction in frequency and intensity by >/= 25%    Status Achieved   7/19 - pt reporting 20-25% reduction in pain with walking            PT Long Term Goals - 01/18/20 1716      PT LONG TERM GOAL #1   Title Patient will be independent with ongoing/advanced HEP    Status Partially Met   01/18/20: met for current HEP (updated today)   Target Date 03/07/20      PT LONG TERM GOAL #2   Title Patient to report pain reduction in frequency and intensity by >/= 75%    Status Revised   Prior goal: 50% improvement - met as of 01/13/20 (60% reduction in frequency of L hip pain & 50% improvement in intensity of pain)   Target Date 03/07/20      PT LONG TERM GOAL #3   Title Patient will demonstrate improved L hip strength to >/= 4+/5 for improved stability and ease of mobility    Status Partially Met    Target Date 03/07/20      PT LONG TERM GOAL #4   Title Patient will improve walking tolerance to >/= 30 minutes w/o pain interference to allow her to walk across campus or take her dog for a walk    Status Partially Met   01/13/20: 20 min   Target Date 03/07/20      PT LONG TERM GOAL #5   Title Patient will negotiate stairs reciprocally with normal step pattern w/o limitation due to L hip pain or weakness    Status Partially Met   01/13/20: some remaining pain at L hip ascending   Target Date 03/07/20                 Plan - 01/25/20 1708    Clinical Impression Statement August reports improving walking tolerance which has helped her to lose 10# but notes continued apprehension with  reciprocal stair ascent due to fear of falling. Assesses stair negotiation with good symmetry and no apparent gait deviations noted both on ascent and descent,  however slight hesitation or awkwardness noted with ascent effort. 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 Kinesiotape applied but switched to the sensitive skin variety as minor skin irritation noted near L greater trochanter from previous tape which pt reported she just removed this morning in the shower. Today's tape applied avoiding area of minor skin irritation from previous taping - may resume KinesioTex or RockTape if no further irritation noted.    Comorbidities L THA 08/22/18 secondary to AVN, R hip AVN, anxiety, depression    Rehab Potential Good    PT Frequency 2x / week    PT Duration 6 weeks    PT Treatment/Interventions ADLs/Self Care Home Management;Cryotherapy;Electrical Stimulation;Iontophoresis 46m/ml Dexamethasone;Moist Heat;Ultrasound;Gait training;Stair training;Functional mobility training;Therapeutic activities;Therapeutic exercise;Balance training;Neuromuscular re-education;Patient/family education;Manual techniques;Scar mobilization;Passive range of motion;Dry needling;Taping    PT Next Visit Plan L hip flexibility & strengthening; manual therapy with potential DN to address abnormal muscle tension in TLF, glutes, quad and ITB; kinesiotaping as continued benefit noted; modalities including ionto patch #6 as benefit noted    PT Home Exercise Plan 7/1 - HS, ITB, glute/piriformis stretches; 7/8 - s/l clam, hip ABD/ext, hip flex/ext, front/back tap, CW/CCW circles, supine red TB clam,  bridge + ABD isometric; 8/9 - red TB standing 4-way SLR    Consulted and Agree with Plan of Care Patient           Patient will benefit from skilled therapeutic intervention in order to improve the following deficits and impairments:  Abnormal gait, Decreased activity tolerance, Decreased endurance, Decreased mobility, Decreased range of motion, Decreased scar mobility, Decreased strength, Difficulty walking, Increased fascial restricitons, Increased muscle spasms, Impaired flexibility, Improper body mechanics, Pain  Visit Diagnosis: Pain in left hip  Difficulty in walking, not elsewhere classified  Other symptoms and signs involving the musculoskeletal system  Other muscle spasm  Muscle weakness (generalized)     Problem List Patient Active Problem List   Diagnosis Date Noted  . Avascular necrosis of hip, left ([place] 08/22/2018  . Avascular necrosis of femoral head, left ([place] 08/22/2018    JPercival Spanish PT, MPT 01/25/2020, 6:43 PM  [facility] [place] [address]Phone: [phone]  Fax:  [phone] Name: Patricia LENIGMRN: [id number] of Birth: 105/27/1972

## 2020-01-28 ENCOUNTER — Other Ambulatory Visit: Payer: Self-pay

## 2020-01-28 ENCOUNTER — Ambulatory Visit: Payer: No Typology Code available for payment source

## 2020-01-28 DIAGNOSIS — M25552 Pain in left hip: Secondary | ICD-10-CM | POA: Diagnosis not present

## 2020-01-28 DIAGNOSIS — R262 Difficulty in walking, not elsewhere classified: Secondary | ICD-10-CM

## 2020-01-28 DIAGNOSIS — R29898 Other symptoms and signs involving the musculoskeletal system: Secondary | ICD-10-CM

## 2020-01-28 DIAGNOSIS — M62838 Other muscle spasm: Secondary | ICD-10-CM

## 2020-01-28 NOTE — Therapy (Signed)
[facility] [address]  [place], [place], [address] Phone: [phone]   Fax:  [phone]  Physical Therapy Treatment  Patient Details  Name: Patricia Mclean MRN: [id number] Date of Birth: [birth year] Referring Provider (PT): Patricia Rossetti, MD   Encounter Date: 01/28/2020   PT End of Session - 01/28/20 1705    Visit Number 14    Number of Visits 25    Date for PT Re-Evaluation 03/07/20    Authorization Type [employer] - VL: 107    PT Start Time 1700    PT Stop Time 1750    PT Time Calculation (min) 50 min    Activity Tolerance Patient tolerated treatment well    Behavior During Therapy [facility] for tasks assessed/performed           Past Medical History:  Diagnosis Date  . Anxiety   . GERD (gastroesophageal reflux disease)   . Headache    metoprolol and topamx  . Pneumonia    x3 had vaccine    Past Surgical History:  Procedure Laterality Date  . TONSILLECTOMY    . TOTAL HIP ARTHROPLASTY Left 08/22/2018   Procedure: TOTAL HIP ARTHROPLASTY ANTERIOR APPROACH;  Surgeon: Dorna Leitz, MD;  Location: [facility];  Service: Orthopedics;  Laterality: Left;    There were no vitals filed for this visit.   Subjective Assessment - 01/28/20 1702    Subjective Pt. noting she is trying to become more confortable navigating stairs reciprocally.  Work has been demanding with lots of meetings.    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 Berenice Primas)    Patient Stated Goals "to walk (on campus & with dog), do stairs & carry handbag w/o pain; sleep on side"    Currently in Pain? No/denies    Pain Score 0-No pain    Pain Location Buttocks    Pain Orientation Left    Pain Descriptors / Indicators Sharp   Sharp with pressure   Pain Type Chronic pain    Multiple Pain Sites No                             [facility] Adult PT Treatment/Exercise - 01/28/20 0001      Knee/Hip Exercises: Stretches     Passive Hamstring Stretch Left;30 seconds;2 reps    Passive Hamstring Stretch Limitations supine with strap     Piriformis Stretch Left;30 seconds;2 reps    Piriformis Stretch Limitations Figure-4, KTOS      Knee/Hip Exercises: Aerobic   Recumbent Bike L2 x 6 min      Knee/Hip Exercises: Standing   Hip Flexion Right;Left;10 reps;Knee straight    Hip Flexion Limitations red TB; no UE support     Hip ADduction Right;Left;10 reps;Strengthening    Hip ADduction Limitations red TB; no UE support     Hip Abduction Right;Left;10 reps;Knee straight    Abduction Limitations red TB; no UE support     Hip Extension Right;Left;10 reps;Knee straight;Stengthening    Extension Limitations red TB; no UE support       Knee/Hip Exercises: Supine   Bridges with Clamshell Both;15 reps;Strengthening   + isometric hip abd/ER into green TB at knees      Knee/Hip Exercises: Sidelying   Clams L clam shell x 15 reps       Manual Therapy   Manual Therapy Myofascial release;Soft tissue mobilization    Manual  therapy comments sidelying     Soft tissue mobilization STM/DTM to L piriformis, L glute med, L glute max    Myofascial Release Manual TPR to L piriformis       Kinesiotix   Create Space 30% along ITB from insertion to posterior & proximal to greater trochanter    Rock tape   Inhibit Muscle  50% star pattern over L piriformis    rock tape                   PT Short Term Goals - 01/01/20 1111      PT SHORT TERM GOAL #1   Title Patient will be independent with initial HEP    Status Achieved   12/15/19     PT SHORT TERM GOAL #2   Title Patient to report pain reduction in frequency and intensity by >/= 25%    Status Achieved   7/19 - pt reporting 20-25% reduction in pain with walking            PT Long Term Goals - 01/18/20 1716      PT LONG TERM GOAL #1   Title Patient will be independent with ongoing/advanced HEP    Status Partially Met   01/18/20: met for current HEP (updated  today)   Target Date 03/07/20      PT LONG TERM GOAL #2   Title Patient to report pain reduction in frequency and intensity by >/= 75%    Status Revised   Prior goal: 50% improvement - met as of 01/13/20 (60% reduction in frequency of L hip pain & 50% improvement in intensity of pain)   Target Date 03/07/20      PT LONG TERM GOAL #3   Title Patient will demonstrate improved L hip strength to >/= 4+/5 for improved stability and ease of mobility    Status Partially Met    Target Date 03/07/20      PT LONG TERM GOAL #4   Title Patient will improve walking tolerance to >/= 30 minutes w/o pain interference to allow her to walk across campus or take her dog for a walk    Status Partially Met   01/13/20: 20 min   Target Date 03/07/20      PT LONG TERM GOAL #5   Title Patient will negotiate stairs reciprocally with normal step pattern w/o limitation due to L hip pain or weakness    Status Partially Met   01/13/20: some remaining pain at L hip ascending   Target Date 03/07/20                 Plan - 01/28/20 1707    Clinical Impression Statement [name] reporting today was last of her work meetings which have required her to sit for a long time which she feels has irritated her L buttocks muscle.  MT addressing palpable TP in L mid piriformis along with DTM to address increased tension in piriformis, glute med, TFL with good relief noted.  Duration of session focused on clarification of proper technique with 4-way hip kicker standing with red TB resistance with pt. demonstrating understanding after cueing.  Ended visit with application of Rock Tape (no visible skin irritation) to L piriformis and L ITB for reduction in tension.    Comorbidities L THA 08/22/18 secondary to AVN, R hip AVN, anxiety, depression    Rehab Potential Good    PT Frequency 2x / week    PT Treatment/Interventions ADLs/Self Care Home  Management;Cryotherapy;Electrical Stimulation;Iontophoresis 74m/ml Dexamethasone;Moist  Heat;Ultrasound;Gait training;Stair training;Functional mobility training;Therapeutic activities;Therapeutic exercise;Balance training;Neuromuscular re-education;Patient/family education;Manual techniques;Scar mobilization;Passive range of motion;Dry needling;Taping    PT Next Visit Plan L hip flexibility & strengthening; manual therapy with potential DN to address abnormal muscle tension in TLF, glutes, quad and ITB; kinesiotaping as continued benefit noted; modalities including ionto patch #6 as benefit noted    PT Home Exercise Plan 7/1 - HS, ITB, glute/piriformis stretches; 7/8 - s/l clam, hip ABD/ext, hip flex/ext, front/back tap, CW/CCW circles, supine red TB clam, bridge + ABD isometric; 8/9 - red TB standing 4-way SLR    Consulted and Agree with Plan of Care Patient           Patient will benefit from skilled therapeutic intervention in order to improve the following deficits and impairments:  Abnormal gait, Decreased activity tolerance, Decreased endurance, Decreased mobility, Decreased range of motion, Decreased scar mobility, Decreased strength, Difficulty walking, Increased fascial restricitons, Increased muscle spasms, Impaired flexibility, Improper body mechanics, Pain  Visit Diagnosis: Pain in left hip  Difficulty in walking, not elsewhere classified  Other symptoms and signs involving the musculoskeletal system  Other muscle spasm     Problem List Patient Active Problem List   Diagnosis Date Noted  . Avascular necrosis of hip, left ([place] 08/22/2018  . Avascular necrosis of femoral head, left ([place] 08/22/2018    MBess Harvest PTA 01/28/20 6:00 PM   [facility] [place] [address]Phone: [phone]  Fax:  [phone] Name: Patricia KEMRN: [id number] of Birth: 106/14/1972

## 2020-02-01 ENCOUNTER — Encounter: Payer: Self-pay | Admitting: Physical Therapy

## 2020-02-01 ENCOUNTER — Other Ambulatory Visit: Payer: Self-pay

## 2020-02-01 ENCOUNTER — Ambulatory Visit: Payer: No Typology Code available for payment source | Admitting: Physical Therapy

## 2020-02-01 DIAGNOSIS — M6281 Muscle weakness (generalized): Secondary | ICD-10-CM

## 2020-02-01 DIAGNOSIS — M62838 Other muscle spasm: Secondary | ICD-10-CM

## 2020-02-01 DIAGNOSIS — M25552 Pain in left hip: Secondary | ICD-10-CM | POA: Diagnosis not present

## 2020-02-01 DIAGNOSIS — R262 Difficulty in walking, not elsewhere classified: Secondary | ICD-10-CM

## 2020-02-01 DIAGNOSIS — R29898 Other symptoms and signs involving the musculoskeletal system: Secondary | ICD-10-CM

## 2020-02-01 NOTE — Therapy (Signed)
[facility] [address]  [place], [place], [address] Phone: [phone]   Fax:  [phone]  Physical Therapy Treatment  Patient Details  Name: Patricia Mclean MRN: [id number] Date of Birth: [birth year] Referring Provider (PT): Mcarthur Rossetti, MD   Encounter Date: 02/01/2020   PT End of Session - 02/01/20 1701    Visit Number 15    Number of Visits 25    Date for PT Re-Evaluation 03/07/20    Authorization Type [employer] - VL: 7    PT Start Time 1701    PT Stop Time 4765    PT Time Calculation (min) 48 min    Activity Tolerance Patient tolerated treatment well    Behavior During Therapy [facility] for tasks assessed/performed           Past Medical History:  Diagnosis Date  . Anxiety   . GERD (gastroesophageal reflux disease)   . Headache    metoprolol and topamx  . Pneumonia    x3 had vaccine    Past Surgical History:  Procedure Laterality Date  . TONSILLECTOMY    . TOTAL HIP ARTHROPLASTY Left 08/22/2018   Procedure: TOTAL HIP ARTHROPLASTY ANTERIOR APPROACH;  Surgeon: Dorna Leitz, MD;  Location: [facility];  Service: Orthopedics;  Laterality: Left;    There were no vitals filed for this visit.   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 Berenice Primas)    Patient Stated Goals "to walk (on campus & with dog), do stairs & carry handbag w/o pain; sleep on side"    Currently in Pain? Yes    Pain Score 2    up to 7/10 with pressure   Pain Location Buttocks    Pain Orientation Left;Upper    Pain Descriptors / Indicators Sharp    Pain Type Chronic pain    Pain Frequency Intermittent                             [facility] Adult PT Treatment/Exercise - 02/01/20 1701      Exercises   Exercises  Knee/Hip      Knee/Hip Exercises: Aerobic   Recumbent Bike L3 x 6 min      Iontophoresis   Type of Iontophoresis Dexamethasone    Location L greater trochanter    Dose 80 mA-min, 1.0 mL    Time 4-6 hr patch (#6 of 6)      Manual Therapy   Manual Therapy Soft tissue mobilization;Myofascial release    Manual therapy comments skilled palpation and monitoring during DN     Soft tissue mobilization STM/DTM to L upper mid/lateral glutes & lateral quads - very ttp     Myofascial Release manual TPR to L glute medius/minimus & quads; pin & stretch to quads/VL and ITB - decreased ttp following DN    Other Manual Therapy roller stick to L quads/VL and ITB            Trigger Point Dry Needling - 02/01/20 1701    Consent Given? Yes    Muscles Treated Lower Quadrant Vastus lateralis   Lt   Muscles Treated Back/Hip Gluteus minimus;Gluteus medius   Lt   Electrical Stimulation Performed with Dry  Needling Yes    E-stim with Dry Needling Details L glutes & quads/VL    Vastus lateralis Response Twitch response elicited;Palpable increased muscle length    Gluteus Minimus Response Twitch response elicited;Palpable increased muscle length    Gluteus Medius Response Twitch response elicited;Palpable increased muscle length                  PT Short Term Goals - 01/01/20 1111      PT SHORT TERM GOAL #1   Title Patient will be independent with initial HEP    Status Achieved   12/15/19     PT SHORT TERM GOAL #2   Title Patient to report pain reduction in frequency and intensity by >/= 25%    Status Achieved   7/19 - pt reporting 20-25% reduction in pain with walking            PT Long Term Goals - 01/18/20 1716      PT LONG TERM GOAL #1   Title Patient will be independent with ongoing/advanced HEP    Status Partially Met   01/18/20: met for current HEP (updated today)   Target Date 03/07/20      PT LONG TERM GOAL #2   Title Patient to report pain reduction in frequency and intensity  by >/= 75%    Status Revised   Prior goal: 50% improvement - met as of 01/13/20 (60% reduction in frequency of L hip pain & 50% improvement in intensity of pain)   Target Date 03/07/20      PT LONG TERM GOAL #3   Title Patient will demonstrate improved L hip strength to >/= 4+/5 for improved stability and ease of mobility    Status Partially Met    Target Date 03/07/20      PT LONG TERM GOAL #4   Title Patient will improve walking tolerance to >/= 30 minutes w/o pain interference to allow her to walk across campus or take her dog for a walk    Status Partially Met   01/13/20: 20 min   Target Date 03/07/20      PT LONG TERM GOAL #5   Title Patient will negotiate stairs reciprocally with normal step pattern w/o limitation due to L hip pain or weakness    Status Partially Met   01/13/20: some remaining pain at L hip ascending   Target Date 03/07/20                 Plan - 02/01/20 1709    Clinical Impression Statement [name] reporting her L hip seems to have held up well during [employer] "he** week" with day-long meetings back-to-back, student move-in and convocation. She did note a "knot" in L upper glutes after sitting for 2 days in meeting which is better today but still tender - pt inquiring about potential DN. MT and DN utilizing estim performed to L proximal glutes and lateral quads/VL with additional MT to L ITB resulting in palpable reduction in muscle tension and decreased ttp. Pt re-instructed in use of rolling pin for self-STM to quads, ITB and HS as needed. Pt requesting final ionto patch application today with plan to resume taping as needed next visit.    Comorbidities L THA 08/22/18 secondary to AVN, R hip AVN, anxiety, depression    Rehab Potential Good    PT Frequency 2x / week    PT Duration 6 weeks    PT Treatment/Interventions ADLs/Self Care Home Management;Cryotherapy;Electrical Stimulation;Iontophoresis 72m/ml Dexamethasone;Moist Heat;Ultrasound;Gait  training;Stair  training;Functional mobility training;Therapeutic activities;Therapeutic exercise;Balance training;Neuromuscular re-education;Patient/family education;Manual techniques;Scar mobilization;Passive range of motion;Dry needling;Taping    PT Next Visit Plan L hip flexibility & strengthening; manual therapy with potential DN to address abnormal muscle tension in TLF, glutes, quad and ITB; kinesiotaping as continued benefit noted; modalities PRN    PT Home Exercise Plan 7/1 - HS, ITB, glute/piriformis stretches; 7/8 - s/l clam, hip ABD/ext, hip flex/ext, front/back tap, CW/CCW circles, supine red TB clam, bridge + ABD isometric; 8/9 - red TB standing 4-way SLR    Consulted and Agree with Plan of Care Patient           Patient will benefit from skilled therapeutic intervention in order to improve the following deficits and impairments:  Abnormal gait, Decreased activity tolerance, Decreased endurance, Decreased mobility, Decreased range of motion, Decreased scar mobility, Decreased strength, Difficulty walking, Increased fascial restricitons, Increased muscle spasms, Impaired flexibility, Improper body mechanics, Pain  Visit Diagnosis: Pain in left hip  Difficulty in walking, not elsewhere classified  Other symptoms and signs involving the musculoskeletal system  Other muscle spasm  Muscle weakness (generalized)     Problem List Patient Active Problem List   Diagnosis Date Noted  . Avascular necrosis of hip, left ([place]) 08/22/2018  . Avascular necrosis of femoral head, left ([place]) 08/22/2018    Percival Spanish, PT, MPT 02/01/2020, 6:11 PM  [facility] [address]  [address] [place], [place], [address] Phone: [phone]   Fax:  [phone]  Name: KATERIN NEGRETE MRN: [id number] Date of Birth: November 18, 1970

## 2020-02-03 ENCOUNTER — Ambulatory Visit: Payer: No Typology Code available for payment source

## 2020-02-05 ENCOUNTER — Ambulatory Visit: Payer: No Typology Code available for payment source | Admitting: Physical Therapy

## 2020-02-05 ENCOUNTER — Encounter: Payer: Self-pay | Admitting: Physical Therapy

## 2020-02-05 ENCOUNTER — Other Ambulatory Visit: Payer: Self-pay

## 2020-02-05 DIAGNOSIS — R262 Difficulty in walking, not elsewhere classified: Secondary | ICD-10-CM

## 2020-02-05 DIAGNOSIS — M25552 Pain in left hip: Secondary | ICD-10-CM

## 2020-02-05 DIAGNOSIS — M62838 Other muscle spasm: Secondary | ICD-10-CM

## 2020-02-05 DIAGNOSIS — M6281 Muscle weakness (generalized): Secondary | ICD-10-CM

## 2020-02-05 DIAGNOSIS — R29898 Other symptoms and signs involving the musculoskeletal system: Secondary | ICD-10-CM

## 2020-02-05 NOTE — Patient Instructions (Signed)
    Home exercise program created by Kinleigh Nault, PT.  For questions, please contact Masayuki Sakai via phone at [phone] or email at [email or web address]  [facility] [place] [address]  [address] [place], , [address] Phone: [phone]   Fax:  [phone]    

## 2020-02-05 NOTE — Therapy (Signed)
[facility] [place] [address]  [place], [place], [address] Phone: [phone]   Fax:  [phone]  Physical Therapy Treatment  Patient Details  Name: Patricia Mclean MRN: [id number] Date of Birth: 1971-04-07 Referring Provider (PT): Mcarthur Rossetti, MD   Encounter Date: 02/05/2020   PT End of Session - 02/05/20 0802    Visit Number 16    Number of Visits 25    Date for PT Re-Evaluation 03/07/20    Authorization Type [employer] - VL: 48    PT Start Time 0802    PT Stop Time 0258    PT Time Calculation (min) 47 min    Activity Tolerance Patient tolerated treatment well    Behavior During Therapy WFL for tasks assessed/performed           Past Medical History:  Diagnosis Date   Anxiety    GERD (gastroesophageal reflux disease)    Headache    metoprolol and topamx   Pneumonia    x3 had vaccine    Past Surgical History:  Procedure Laterality Date   TONSILLECTOMY     TOTAL HIP ARTHROPLASTY Left 08/22/2018   Procedure: TOTAL HIP ARTHROPLASTY ANTERIOR APPROACH;  Surgeon: Patricia Leitz, MD;  Location: [facility];  Service: Orthopedics;  Laterality: Left;    There were no vitals filed for this visit.   Subjective Assessment - 02/05/20 0806    Subjective Pt reports she tried using her jet tub on Wednesday now that her hip had been feeling better (previously had not been able to tolerate the pressure from the jets) however she reports she slipped getting out of the tub and "tweeked" her hip - it has been sore since but gradually getting better.    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 Berenice Primas)    Patient Stated Goals "to walk (on campus & with dog), do stairs & carry handbag w/o pain; sleep on side"    Currently in Pain? No/denies                             [facility] Adult PT Treatment/Exercise - 02/05/20 0802      Exercises   Exercises Knee/Hip      Knee/Hip Exercises:  Stretches   Hip Flexor Stretch Left;30 seconds;3 reps    Hip Flexor Stretch Limitations mod thomas with strap  & seated lunge position over edge of chair - pt preferring the latter    ITB Stretch Left;30 seconds;3 reps    ITB Stretch Limitations standing lateral lean at wall      Knee/Hip Exercises: Aerobic   Recumbent Bike L2 x 6 min      Knee/Hip Exercises: Standing   Forward Lunges Right;Left;10 reps;3 seconds    Forward Lunges Limitations standing btw 2 chairs for UE support    Lateral Step Up Left;Both;10 reps;2 sets;Step Height: 6";Hand Hold: 2   intermittent UE support on TM rail   Lateral Step Up Limitations cues for awareness of level plevis    Step Down Left;10 reps;2 sets;Step Height: 6";Hand Hold: 1    Step Down Limitations lateral & fwd eccentric lowering  with light heel touch    Other Standing Knee Exercises B side stepping & fwd/nack monster walk with red TB at ankles 2 x 25 ft      Manual Therapy   Manual Therapy Taping      Kinesiotix   Create  Space 30% along ITB from insertion to proximal to greater trochanter    Inhibit Muscle  30%-50% glute medius pattern - 2 strips: posterior lip of iliac crest to just distal to greater trochanter & ASIS to just distal to greater trochanter (both strips crossing ITB strip to create "star" over GT)                  PT Education - 02/05/20 0845    Education Details HEP update - lunges, red TB lateral & fwd/back monster walk, fwd & lateral step-downs    Person(s) Educated Patient    Methods Explanation;Demonstration;Verbal cues;Handout    Comprehension Verbalized understanding;Verbal cues required;Returned demonstration;Need further instruction            PT Short Term Goals - 01/01/20 1111      PT SHORT TERM GOAL #1   Title Patient will be independent with initial HEP    Status Achieved   12/15/19     PT SHORT TERM GOAL #2   Title Patient to report pain reduction in frequency and intensity by >/= 25%    Status  Achieved   7/19 - pt reporting 20-25% reduction in pain with walking            PT Long Term Goals - 02/05/20 0809      PT LONG TERM GOAL #1   Title Patient will be independent with ongoing/advanced HEP    Status Partially Met   01/18/20: met for current HEP (updated today)   Target Date 03/07/20      PT LONG TERM GOAL #2   Title Patient to report pain reduction in frequency and intensity by >/= 75%    Baseline Prior goal: 50% improvement - met as of 01/13/20 (60% reduction in frequency of L hip pain & 50% improvement in intensity of pain)    Status On-going   02/05/20 - 60% improved - more tightness with pain localized to bursa   Target Date 03/07/20      PT LONG TERM GOAL #3   Title Patient will demonstrate improved L hip strength to >/= 4+/5 for improved stability and ease of mobility    Status Partially Met    Target Date 03/07/20      PT LONG TERM GOAL #4   Title Patient will improve walking tolerance to >/= 30 minutes w/o pain interference to allow her to walk across campus or take her dog for a walk    Status Partially Met   02/05/20 - "getting up to 30 min"   Target Date 03/07/20      PT LONG TERM GOAL #5   Title Patient will negotiate stairs reciprocally with normal step pattern w/o limitation due to L hip pain or weakness    Status Partially Met    Target Date 03/07/20                 Plan - 02/05/20 0812    Clinical Impression Statement Patricia Mclean reporting some increased L hip soreness after a misstep getting out of her jet tub on Wed - soreness gradually improving but has been more limited with HEP following incident. She denied need for manual therapy or DN to address new soreness and expressed desire to proceed with strengthening, therefore therapy session focusing on progression of functional strengthening with HEP updated accordingly. Pt noting increased fatigue by end of session, but no increased pain reported. Session concluded with RockTape application at pt  request with pattern modified to  address more glutes and TFL in addition to ITB.    Comorbidities L THA 08/22/18 secondary to AVN, R hip AVN, anxiety, depression    Rehab Potential Good    PT Frequency 2x / week    PT Duration 6 weeks    PT Treatment/Interventions ADLs/Self Care Home Management;Cryotherapy;Electrical Stimulation;Iontophoresis 26m/ml Dexamethasone;Moist Heat;Ultrasound;Gait training;Stair training;Functional mobility training;Therapeutic activities;Therapeutic exercise;Balance training;Neuromuscular re-education;Patient/family education;Manual techniques;Scar mobilization;Passive range of motion;Dry needling;Taping    PT Next Visit Plan assess response to modified taping pattern; L hip flexibility & strengthening; manual therapy with potential DN to address abnormal muscle tension in TLF, glutes, quad and ITB; kinesiotaping as continued benefit noted; modalities PRN    PT Home Exercise Plan 7/1 - HS, ITB, glute/piriformis stretches; 7/8 - s/l clam, hip ABD/ext, hip flex/ext, front/back tap, CW/CCW circles, supine red TB clam, bridge + ABD isometric; 8/9 - red TB standing 4-way SLR; 8/27 - lunges, red TB lateral & fwd/back monster walk, fwd & lateral step-downs    Consulted and Agree with Plan of Care Patient           Patient will benefit from skilled therapeutic intervention in order to improve the following deficits and impairments:  Abnormal gait, Decreased activity tolerance, Decreased endurance, Decreased mobility, Decreased range of motion, Decreased scar mobility, Decreased strength, Difficulty walking, Increased fascial restricitons, Increased muscle spasms, Impaired flexibility, Improper body mechanics, Pain  Visit Diagnosis: Pain in left hip  Difficulty in walking, not elsewhere classified  Other symptoms and signs involving the musculoskeletal system  Other muscle spasm  Muscle weakness (generalized)     Problem List Patient Active Problem List   Diagnosis  Date Noted   Avascular necrosis of hip, left ([facility] 08/22/2018   Avascular necrosis of femoral head, left ([facility] 08/22/2018    JPercival Spanish PT, MPT 02/05/2020, 12:58 PM  [facility] [place] [address]Phone: [phone]  Fax:  [phone] Name: STEDDI BADALAMENTIMRN: [id number] of Birth: [birth year]

## 2020-02-08 ENCOUNTER — Ambulatory Visit: Payer: No Typology Code available for payment source | Admitting: Physical Therapy

## 2020-02-08 ENCOUNTER — Other Ambulatory Visit: Payer: Self-pay

## 2020-02-08 DIAGNOSIS — M25552 Pain in left hip: Secondary | ICD-10-CM | POA: Diagnosis not present

## 2020-02-08 DIAGNOSIS — M6281 Muscle weakness (generalized): Secondary | ICD-10-CM

## 2020-02-08 DIAGNOSIS — M62838 Other muscle spasm: Secondary | ICD-10-CM

## 2020-02-08 DIAGNOSIS — R29898 Other symptoms and signs involving the musculoskeletal system: Secondary | ICD-10-CM

## 2020-02-08 DIAGNOSIS — R262 Difficulty in walking, not elsewhere classified: Secondary | ICD-10-CM

## 2020-02-08 NOTE — Patient Instructions (Signed)
    Home exercise program created by Shawanda Sievert, PT.  For questions, please contact Aizlyn Schifano via phone at [phone] or email at [email or web address]  [facility] [place] [address]  [address] [place], [place], [address] Phone: [phone]   Fax:  [phone]    

## 2020-02-08 NOTE — Therapy (Signed)
[facility] [place] [address]  [place], [place], [address] Phone: [phone]   Fax:  [phone]  Physical Therapy Treatment  Patient Details  Name: Patricia Mclean MRN: [id number] Date of Birth: Oct 09, 1970 Referring Provider (PT): Mcarthur Rossetti, MD   Encounter Date: 02/08/2020   PT End of Session - 02/08/20 1657    Visit Number 17    Number of Visits 25    Date for PT Re-Evaluation 03/07/20    Authorization Type [employer] - VL: 71    PT Start Time 1657    PT Stop Time 3500    PT Time Calculation (min) 50 min    Activity Tolerance Patient tolerated treatment well    Behavior During Therapy [facility] for tasks assessed/performed           Past Medical History:  Diagnosis Date  . Anxiety   . GERD (gastroesophageal reflux disease)   . Headache    metoprolol and topamx  . Pneumonia    x3 had vaccine    Past Surgical History:  Procedure Laterality Date  . TONSILLECTOMY    . TOTAL HIP ARTHROPLASTY Left 08/22/2018   Procedure: TOTAL HIP ARTHROPLASTY ANTERIOR APPROACH;  Surgeon: Dorna Leitz, MD;  Location: [facility];  Service: Orthopedics;  Laterality: Left;    There were no vitals filed for this visit.   Subjective Assessment - 02/08/20 1659    Subjective Pt stating "you did a number on me last time" - notes increased DOMS in front of legs on Sat & Sun from exercise progression last visit, but lessened today. Denies hip pain today, noting only tightness. She feels like the new taping pattern worked well.    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 Berenice Mclean)    Patient Stated Goals "to walk (on campus & with dog), do stairs & carry handbag w/o pain; sleep on side"    Currently in Pain? Yes                             [facility] Adult PT Treatment/Exercise - 02/08/20 1657      Knee/Hip Exercises: Stretches   Hip Flexor Stretch Left;30 seconds;1 rep    Hip Flexor Stretch Limitations  seated lunge position over edge of chair       Knee/Hip Exercises: Aerobic   Recumbent Bike L2 x 6 min      Manual Therapy   Manual Therapy Soft tissue mobilization;Myofascial release    Manual therapy comments skilled palpation and monitoring during DN     Soft tissue mobilization STM/DTM to L upper mid/lateral glutes, piriformis & lateral quads/HS - very ttp     Myofascial Release manual TPR to L glute medius/minimus & piriformis, proximal lateral quads & distal lateral HS; pin & stretch to L VL, lateral HS & ITB - decreased ttp following DN            Trigger Point Dry Needling - 02/08/20 1657    Consent Given? Yes    Muscles Treated Lower Quadrant Vastus lateralis;Hamstring   Lt   Muscles Treated Back/Hip Gluteus minimus;Gluteus medius;Gluteus maximus;Piriformis   Lt   Electrical Stimulation Performed with Dry Needling Yes    E-stim with Dry Needling Details L glutes, HS & VL    Vastus lateralis Response Twitch response elicited;Palpable increased muscle length   L mild/proximal VL   Hamstring Response Twitch response elicited;Palpable increased muscle length  Lt distal lateral HS   Gluteus Minimus Response Twitch response elicited;Palpable increased muscle length    Gluteus Medius Response Twitch response elicited;Palpable increased muscle length    Gluteus Maximus Response Twitch response elicited;Palpable increased muscle length    Piriformis Response Twitch response elicited;Palpable increased muscle length                PT Education - 02/08/20 1747    Education Details HEP update - seated hip flexor/quad stretch    Person(s) Educated Patient    Methods Explanation;Demonstration;Handout    Comprehension Verbalized understanding;Returned demonstration            PT Short Term Goals - 01/01/20 1111      PT SHORT TERM GOAL #1   Title Patient will be independent with initial HEP    Status Achieved   12/15/19     PT SHORT TERM GOAL #2   Title Patient to report  pain reduction in frequency and intensity by >/= 25%    Status Achieved   7/19 - pt reporting 20-25% reduction in pain with walking            PT Long Term Goals - 02/05/20 0809      PT LONG TERM GOAL #1   Title Patient will be independent with ongoing/advanced HEP    Status Partially Met   01/18/20: met for current HEP (updated today)   Target Date 03/07/20      PT LONG TERM GOAL #2   Title Patient to report pain reduction in frequency and intensity by >/= 75%    Baseline Prior goal: 50% improvement - met as of 01/13/20 (60% reduction in frequency of L hip pain & 50% improvement in intensity of pain)    Status On-going   02/05/20 - 60% improved - more tightness with pain localized to bursa   Target Date 03/07/20      PT LONG TERM GOAL #3   Title Patient will demonstrate improved L hip strength to >/= 4+/5 for improved stability and ease of mobility    Status Partially Met    Target Date 03/07/20      PT LONG TERM GOAL #4   Title Patient will improve walking tolerance to >/= 30 minutes w/o pain interference to allow her to walk across campus or take her dog for a walk    Status Partially Met   02/05/20 - "getting up to 30 min"   Target Date 03/07/20      PT LONG TERM GOAL #5   Title Patient will negotiate stairs reciprocally with normal step pattern w/o limitation due to L hip pain or weakness    Status Partially Met    Target Date 03/07/20                 Plan - 02/08/20 1702    Clinical Impression Statement [name] reports increased post-exercise muscle over the weekend following her last PT session (most notably in quads), which is now subsiding, but pt noting some increased muscle tension/tightness in L buttocks/hip and lateral leg today. Taut bands and TPs addressed with manual therapy incorporating DN +/- estim with good twitch responses elicited resulting in palpable reduction in muscle tension and ttp. Reviewed modified version of hip flexor/quad stretch introduced  last session and provided HEP instructions. Pt reports she has not yet attempted new HEP exercises but plans to try them this evening, therefore will review or address any concerns next visit. Deferred kinesiotaping today to see  how she fares w/o tape for the next 2 days.    Comorbidities L THA 08/22/18 secondary to AVN, R hip AVN, anxiety, depression    Rehab Potential Good    PT Frequency 2x / week    PT Duration 6 weeks    PT Treatment/Interventions ADLs/Self Care Home Management;Cryotherapy;Electrical Stimulation;Iontophoresis 76m/ml Dexamethasone;Moist Heat;Ultrasound;Gait training;Stair training;Functional mobility training;Therapeutic activities;Therapeutic exercise;Balance training;Neuromuscular re-education;Patient/family education;Manual techniques;Scar mobilization;Passive range of motion;Dry needling;Taping    PT Next Visit Plan L hip flexibility & strengthening; manual therapy with potential DN to address abnormal muscle tension in TLF, glutes, quad and ITB; kinesiotaping as continued benefit noted; modalities PRN    PT Home Exercise Plan 7/1 - HS, ITB, glute/piriformis stretches; 7/8 - s/l clam, hip ABD/ext, hip flex/ext, front/back tap, CW/CCW circles, supine red TB clam, bridge + ABD isometric; 8/9 - red TB standing 4-way SLR; 8/27 - lunges, red TB lateral & fwd/back monster walk, fwd & lateral step-downs; 8/30 - seated hip flexor/quad stretch    Consulted and Agree with Plan of Care Patient           Patient will benefit from skilled therapeutic intervention in order to improve the following deficits and impairments:  Abnormal gait, Decreased activity tolerance, Decreased endurance, Decreased mobility, Decreased range of motion, Decreased scar mobility, Decreased strength, Difficulty walking, Increased fascial restricitons, Increased muscle spasms, Impaired flexibility, Improper body mechanics, Pain  Visit Diagnosis: Pain in left hip  Difficulty in walking, not elsewhere  classified  Other symptoms and signs involving the musculoskeletal system  Other muscle spasm  Muscle weakness (generalized)     Problem List Patient Active Problem List   Diagnosis Date Noted  . Avascular necrosis of hip, left ([place] 08/22/2018  . Avascular necrosis of femoral head, left ([facility] 08/22/2018    JPercival Spanish PT, MPT 02/08/2020, 6:26 PM  [facility] [address]Phone: [phone]  Fax:  [phone] Name: SLONNI DIRDENMRN: [id number] of Birth: 122-Jul-1972

## 2020-02-10 ENCOUNTER — Other Ambulatory Visit: Payer: Self-pay

## 2020-02-10 ENCOUNTER — Ambulatory Visit: Payer: No Typology Code available for payment source | Attending: Orthopaedic Surgery

## 2020-02-10 DIAGNOSIS — M6281 Muscle weakness (generalized): Secondary | ICD-10-CM | POA: Diagnosis present

## 2020-02-10 DIAGNOSIS — R262 Difficulty in walking, not elsewhere classified: Secondary | ICD-10-CM

## 2020-02-10 DIAGNOSIS — M25552 Pain in left hip: Secondary | ICD-10-CM

## 2020-02-10 DIAGNOSIS — M62838 Other muscle spasm: Secondary | ICD-10-CM | POA: Insufficient documentation

## 2020-02-10 DIAGNOSIS — R29898 Other symptoms and signs involving the musculoskeletal system: Secondary | ICD-10-CM | POA: Insufficient documentation

## 2020-02-10 NOTE — Therapy (Signed)
[facility] [address]  [place], [place], [address] Phone: [phone]   Fax:  [phone]  Physical Therapy Treatment  Patient Details  Name: Patricia Mclean MRN: [id number] Date of Birth: [birth year] Referring Provider (PT): Mcarthur Rossetti, MD   Encounter Date: 02/10/2020   PT End of Session - 02/10/20 1715    Visit Number 18    Number of Visits 25    Date for PT Re-Evaluation 03/07/20    Authorization Type [employer] - VL: 58    PT Start Time 5400    PT Stop Time 8676    PT Time Calculation (min) 45 min    Activity Tolerance Patient tolerated treatment well    Behavior During Therapy [facility] for tasks assessed/performed           Past Medical History:  Diagnosis Date  . Anxiety   . GERD (gastroesophageal reflux disease)   . Headache    metoprolol and topamx  . Pneumonia    x3 had vaccine    Past Surgical History:  Procedure Laterality Date  . TONSILLECTOMY    . TOTAL HIP ARTHROPLASTY Left 08/22/2018   Procedure: TOTAL HIP ARTHROPLASTY ANTERIOR APPROACH;  Surgeon: Dorna Leitz, MD;  Location: [facility];  Service: Orthopedics;  Laterality: Left;    There were no vitals filed for this visit.   Subjective Assessment - 02/10/20 1713    Subjective Pt. noting some remaining DOMS in B quads.    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 Berenice Primas)    Patient Stated Goals "to walk (on campus & with dog), do stairs & carry handbag w/o pain; sleep on side"    Currently in Pain? Yes    Pain Score 3     Pain Location Leg    Pain Orientation Right;Left    Pain Descriptors / Indicators Sore    Pain Type Chronic pain    Multiple Pain Sites No                             [facility] Adult PT Treatment/Exercise - 02/10/20 0001      Knee/Hip Exercises: Stretches   Hip Flexor Stretch Right;Left;1 rep;30 seconds    Hip Flexor Stretch Limitations seated lunge       Knee/Hip Exercises:  Aerobic   Recumbent Bike L2 x 6 min      Knee/Hip Exercises: Standing   Step Down Right;Left;Step Height: 4";Hand Hold: 2;10 reps    Step Down Limitations lateral step-down with heel tap    Functional Squat 10 reps;3 seconds    Functional Squat Limitations TRX - cued to use glutes contraction throughout movement     Wall Squat 10 reps;3 seconds      Knee/Hip Exercises: Supine   Bridges with Clamshell Right;Left;Both;10 reps;Strengthening   + alternating LAQ + green TB hip abd/ER isometrics    Other Supine Knee/Hip Exercises Alternating clam shell with green TB 3' x 10 reps      Knee/Hip Exercises: Sidelying   Clams L clam shell yellow TB 2 x 8 reps         Manual Therapy   Soft tissue mobilization STM to L glutes/piriformis      Kinesiotix   Create Space 30% along ITB from insertion to proximal to greater trochanter    Inhibit Muscle  30%-50% glute medius pattern - 2 strips: posterior lip of iliac crest to  just distal to greater trochanter & ASIS to just distal to greater trochanter (both strips crossing ITB strip to create "star" over GT)                    PT Short Term Goals - 01/01/20 1111      PT SHORT TERM GOAL #1   Title Patient will be independent with initial HEP    Status Achieved   12/15/19     PT SHORT TERM GOAL #2   Title Patient to report pain reduction in frequency and intensity by >/= 25%    Status Achieved   7/19 - pt reporting 20-25% reduction in pain with walking            PT Long Term Goals - 02/05/20 0809      PT LONG TERM GOAL #1   Title Patient will be independent with ongoing/advanced HEP    Status Partially Met   01/18/20: met for current HEP (updated today)   Target Date 03/07/20      PT LONG TERM GOAL #2   Title Patient to report pain reduction in frequency and intensity by >/= 75%    Baseline Prior goal: 50% improvement - met as of 01/13/20 (60% reduction in frequency of L hip pain & 50% improvement in intensity of pain)    Status  On-going   02/05/20 - 60% improved - more tightness with pain localized to bursa   Target Date 03/07/20      PT LONG TERM GOAL #3   Title Patient will demonstrate improved L hip strength to >/= 4+/5 for improved stability and ease of mobility    Status Partially Met    Target Date 03/07/20      PT LONG TERM GOAL #4   Title Patient will improve walking tolerance to >/= 30 minutes w/o pain interference to allow her to walk across campus or take her dog for a walk    Status Partially Met   02/05/20 - "getting up to 30 min"   Target Date 03/07/20      PT LONG TERM GOAL #5   Title Patient will negotiate stairs reciprocally with normal step pattern w/o limitation due to L hip pain or weakness    Status Partially Met    Target Date 03/07/20                 Plan - 02/10/20 1755    Clinical Impression Statement Patricia Mclean reporting some remaining B quad soreness (DOMS) from previous therapy sessions however this has improved.  Progressed LE strengthening/proximal hip strengthening with good tolerance and no increased pain reported.  Was able to initiate yellow TB resisted L hip clam shell 2 x 8 reps which pt. had previously been unable to tolerate due to pain.  Ended visit with reapplication of L hip/ITB Rock Tape as pt. notes good ongoing relief from this.    Comorbidities L THA 08/22/18 secondary to AVN, R hip AVN, anxiety, depression    Rehab Potential Good    PT Frequency 2x / week    PT Duration 6 weeks    PT Treatment/Interventions ADLs/Self Care Home Management;Cryotherapy;Electrical Stimulation;Iontophoresis 4mg/ml Dexamethasone;Moist Heat;Ultrasound;Gait training;Stair training;Functional mobility training;Therapeutic activities;Therapeutic exercise;Balance training;Neuromuscular re-education;Patient/family education;Manual techniques;Scar mobilization;Passive range of motion;Dry needling;Taping    PT Next Visit Plan L hip flexibility & strengthening; manual therapy with potential DN to  address abnormal muscle tension in TLF, glutes, quad and ITB; kinesiotaping as continued benefit noted; modalities PRN      PT Home Exercise Plan 7/1 - HS, ITB, glute/piriformis stretches; 7/8 - s/l clam, hip ABD/ext, hip flex/ext, front/back tap, CW/CCW circles, supine red TB clam, bridge + ABD isometric; 8/9 - red TB standing 4-way SLR; 8/27 - lunges, red TB lateral & fwd/back monster walk, fwd & lateral step-downs; 8/30 - seated hip flexor/quad stretch    Consulted and Agree with Plan of Care Patient           Patient will benefit from skilled therapeutic intervention in order to improve the following deficits and impairments:  Abnormal gait, Decreased activity tolerance, Decreased endurance, Decreased mobility, Decreased range of motion, Decreased scar mobility, Decreased strength, Difficulty walking, Increased fascial restricitons, Increased muscle spasms, Impaired flexibility, Improper body mechanics, Pain  Visit Diagnosis: Pain in left hip  Difficulty in walking, not elsewhere classified  Other symptoms and signs involving the musculoskeletal system  Other muscle spasm  Muscle weakness (generalized)     Problem List Patient Active Problem List   Diagnosis Date Noted  . Avascular necrosis of hip, left ([place]) 08/22/2018  . Avascular necrosis of femoral head, left ([place]) 08/22/2018    Bess Harvest, PTA 02/10/20 5:58 PM   [facility] [address]  [place], [place], [address] Phone: [phone]   Fax:  [phone]  Name: Patricia Mclean MRN: [id number] Date of Birth: 01/05/1971

## 2020-02-16 ENCOUNTER — Ambulatory Visit: Payer: No Typology Code available for payment source | Admitting: Physical Therapy

## 2020-02-16 ENCOUNTER — Encounter: Payer: Self-pay | Admitting: Physical Therapy

## 2020-02-16 ENCOUNTER — Other Ambulatory Visit: Payer: Self-pay

## 2020-02-16 DIAGNOSIS — M25552 Pain in left hip: Secondary | ICD-10-CM | POA: Diagnosis not present

## 2020-02-16 DIAGNOSIS — R29898 Other symptoms and signs involving the musculoskeletal system: Secondary | ICD-10-CM

## 2020-02-16 DIAGNOSIS — M6281 Muscle weakness (generalized): Secondary | ICD-10-CM

## 2020-02-16 DIAGNOSIS — R262 Difficulty in walking, not elsewhere classified: Secondary | ICD-10-CM

## 2020-02-16 DIAGNOSIS — M62838 Other muscle spasm: Secondary | ICD-10-CM

## 2020-02-16 NOTE — Therapy (Addendum)
[facility] [address]  [place], [place], [address] Phone: [phone]   Fax:  [phone]  Physical Therapy Treatment  Patient Details  Name: Patricia Mclean MRN: [id number] Date of Birth: [birth year] Referring Provider (PT): Mcarthur Rossetti, MD   Encounter Date: 02/16/2020   PT End of Session - 02/16/20 1702    Visit Number 19    Number of Visits 25    Date for PT Re-Evaluation 03/07/20    Authorization Type [employer] - VL: 6    PT Start Time 1702    PT Stop Time 1751    PT Time Calculation (min) 49 min    Activity Tolerance Patient tolerated treatment well    Behavior During Therapy [facility] for tasks assessed/performed           Past Medical History:  Diagnosis Date  . Anxiety   . GERD (gastroesophageal reflux disease)   . Headache    metoprolol and topamx  . Pneumonia    x3 had vaccine    Past Surgical History:  Procedure Laterality Date  . TONSILLECTOMY    . TOTAL HIP ARTHROPLASTY Left 08/22/2018   Procedure: TOTAL HIP ARTHROPLASTY ANTERIOR APPROACH;  Surgeon: Dorna Leitz, MD;  Location: [facility];  Service: Orthopedics;  Laterality: Left;    There were no vitals filed for this visit.   Subjective Assessment - 02/16/20 1704    Subjective Pt reports she flew to go visit her dad - notes "night & day" difference from last flight back in May - able to walk through the airport w/o stopping but did notes some mild discomfort sitting in uncomfortable airline seats as well as driving 3 hours upon arrival to get to her dads. Overall she was very pleased with how the trip went.    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 Berenice Primas)    Patient Stated Goals "to walk (on campus & with dog), do stairs & carry handbag w/o pain; sleep on side"    Currently in Pain? Yes    Pain Score 3     Pain Location Hip    Pain Orientation Left;Lateral    Pain Descriptors / Indicators Sore    Pain Type  Chronic pain    Pain Radiating Towards down lateral thigh/ITB                             [facility] Adult PT Treatment/Exercise - 02/16/20 1702      Exercises   Exercises Knee/Hip      Knee/Hip Exercises: Stretches   Hip Flexor Stretch Left;30 seconds;2 reps    Hip Flexor Stretch Limitations seated lunge position over edge of chair - VC & TC for increased hip extension to better target proximal quads and hip flexors      Knee/Hip Exercises: Aerobic   Recumbent Bike L2 x 6 min      Manual Therapy   Manual Therapy Soft tissue mobilization;Myofascial release    Manual therapy comments skilled palpation and monitoring during DN     Soft tissue mobilization STM/DTM to L TFL, upper mid/lateral glutes, ITB & lateral quads/HS - very ttp     Myofascial Release manual TPR to L TFL, lateral glute med/min; pin & stretch to L ITB, lateral HS and quads/VL - decreased ttp following DN            Trigger Point Dry Needling - 02/16/20  1702    Consent Given? Yes    Muscles Treated Lower Quadrant Vastus lateralis;Hamstring   Lt   Muscles Treated Back/Hip Tensor fascia lata;Gluteus minimus;Gluteus medius   Lt   Electrical Stimulation Performed with Dry Needling Yes    E-stim with Dry Needling Details L glutes, HS & VL    Vastus lateralis Response Twitch response elicited;Palpable increased muscle length   L proximal VL   Hamstring Response Twitch response elicited;Palpable increased muscle length   Lt distal lateral HS   Gluteus Minimus Response Twitch response elicited;Palpable increased muscle length    Gluteus Medius Response Twitch response elicited;Palpable increased muscle length    Tensor Fascia Lata Response Twitch response elicited;Palpable increased muscle length                  PT Short Term Goals - 01/01/20 1111      PT SHORT TERM GOAL #1   Title Patient will be independent with initial HEP    Status Achieved   12/15/19     PT SHORT TERM GOAL #2   Title  Patient to report pain reduction in frequency and intensity by >/= 25%    Status Achieved   7/19 - pt reporting 20-25% reduction in pain with walking            PT Long Term Goals - 02/05/20 0809      PT LONG TERM GOAL #1   Title Patient will be independent with ongoing/advanced HEP    Status Partially Met   01/18/20: met for current HEP (updated today)   Target Date 03/07/20      PT LONG TERM GOAL #2   Title Patient to report pain reduction in frequency and intensity by >/= 75%    Baseline Prior goal: 50% improvement - met as of 01/13/20 (60% reduction in frequency of L hip pain & 50% improvement in intensity of pain)    Status On-going   02/05/20 - 60% improved - more tightness with pain localized to bursa   Target Date 03/07/20      PT LONG TERM GOAL #3   Title Patient will demonstrate improved L hip strength to >/= 4+/5 for improved stability and ease of mobility    Status Partially Met    Target Date 03/07/20      PT LONG TERM GOAL #4   Title Patient will improve walking tolerance to >/= 30 minutes w/o pain interference to allow her to walk across campus or take her dog for a walk    Status Partially Met   02/05/20 - "getting up to 30 min"   Target Date 03/07/20      PT LONG TERM GOAL #5   Title Patient will negotiate stairs reciprocally with normal step pattern w/o limitation due to L hip pain or weakness    Status Partially Met    Target Date 03/07/20                 Plan - 02/16/20 1751    Clinical Impression Statement Patricia Mclean is very pleased with how well she tolerated traveling/flying to visit her parents last week, noting much better tolerance for walking through the airport but still noting some soreness and increased muscle tension/tightness in L buttocks/hip and lateral leg today from limited mobility while riding in the airplane and car.  Taut bands and TPs addressed with manual therapy incorporating DN +/- estim with good twitch responses elicited resulting  in palpable reduction in muscle tension and  ttp. Greatest ttp noted over TFL and proximal quads/VL - pt unsure if she is performing anterior hip stretch appropriately, therefore reviewed stretch with pt encouraging increased hip extension to better target proximal quads and hip flexors. Patricia Mclean interest in trying to wean PT visits to 1x/wk before end of POC to see how well she will be able to manage on her own, therefore will plan to attempt to reduce frequency to 1x/wk starting next week after HEP review/update on 2nd visit this week.    Comorbidities L THA 08/22/18 secondary to AVN, R hip AVN, anxiety, depression    Rehab Potential Good    PT Frequency 2x / week    PT Duration 6 weeks    PT Treatment/Interventions ADLs/Self Care Home Management;Cryotherapy;Electrical Stimulation;Iontophoresis 81m/ml Dexamethasone;Moist Heat;Ultrasound;Gait training;Stair training;Functional mobility training;Therapeutic activities;Therapeutic exercise;Balance training;Neuromuscular re-education;Patient/family education;Manual techniques;Scar mobilization;Passive range of motion;Dry needling;Taping    PT Next Visit Plan L hip flexibility & strengthening; manual therapy with potential DN to address abnormal muscle tension in TLF, glutes, quad and ITB; kinesiotaping as continued benefit noted; modalities PRN    PT Home Exercise Plan 7/1 - HS, ITB, glute/piriformis stretches; 7/8 - s/l clam, hip ABD/ext, hip flex/ext, front/back tap, CW/CCW circles, supine red TB clam, bridge + ABD isometric; 8/9 - red TB standing 4-way SLR; 8/27 - lunges, red TB lateral & fwd/back monster walk, fwd & lateral step-downs; 8/30 - seated hip flexor/quad stretch    Consulted and Agree with Plan of Care Patient           Patient will benefit from skilled therapeutic intervention in order to improve the following deficits and impairments:  Abnormal gait, Decreased activity tolerance, Decreased endurance, Decreased mobility,  Decreased range of motion, Decreased scar mobility, Decreased strength, Difficulty walking, Increased fascial restricitons, Increased muscle spasms, Impaired flexibility, Improper body mechanics, Pain  Visit Diagnosis: Pain in left hip  Difficulty in walking, not elsewhere classified  Other symptoms and signs involving the musculoskeletal system  Other muscle spasm  Muscle weakness (generalized)     Problem List Patient Active Problem List   Diagnosis Date Noted  . [place] 08/22/2018  . Avascular necrosis of femoral head, left ([facility] 08/22/2018    JPercival Spanish PT, MPT 02/16/2020, 6:41 PM  [facility] [place] [address]Phone: [phone]  Fax:  [phone] Name: Patricia JURGENSENMRN: [id number] of Birth: 106/20/1972

## 2020-02-18 ENCOUNTER — Other Ambulatory Visit: Payer: Self-pay

## 2020-02-18 ENCOUNTER — Ambulatory Visit: Payer: No Typology Code available for payment source

## 2020-02-18 DIAGNOSIS — M25552 Pain in left hip: Secondary | ICD-10-CM

## 2020-02-18 DIAGNOSIS — R29898 Other symptoms and signs involving the musculoskeletal system: Secondary | ICD-10-CM

## 2020-02-18 DIAGNOSIS — R262 Difficulty in walking, not elsewhere classified: Secondary | ICD-10-CM

## 2020-02-18 DIAGNOSIS — M6281 Muscle weakness (generalized): Secondary | ICD-10-CM

## 2020-02-18 DIAGNOSIS — M62838 Other muscle spasm: Secondary | ICD-10-CM

## 2020-02-18 NOTE — Therapy (Signed)
[facility] [address]  [place], [place], [address] Phone: [phone]   Fax:  [phone]  Physical Therapy Treatment  Patient Details  Name: Patricia Mclean MRN: [id number] Date of Birth: May 21, 1971 Referring Provider (PT): Mcarthur Rossetti, MD   Encounter Date: 02/18/2020   PT End of Session - 02/18/20 1707    Visit Number 20    Number of Visits 25    Date for PT Re-Evaluation 03/07/20    Authorization Type [employer] - VL: 30    PT Start Time 1702    PT Stop Time 1750    PT Time Calculation (min) 48 min    Activity Tolerance Patient tolerated treatment well    Behavior During Therapy [facility] for tasks assessed/performed           Past Medical History:  Diagnosis Date  . Anxiety   . GERD (gastroesophageal reflux disease)   . Headache    metoprolol and topamx  . Pneumonia    x3 had vaccine    Past Surgical History:  Procedure Laterality Date  . TONSILLECTOMY    . TOTAL HIP ARTHROPLASTY Left 08/22/2018   Procedure: TOTAL HIP ARTHROPLASTY ANTERIOR APPROACH;  Surgeon: Dorna Leitz, MD;  Location: [facility];  Service: Orthopedics;  Laterality: Left;    There were no vitals filed for this visit.   Subjective Assessment - 02/18/20 1708    Subjective Pt. doing well today - Notes she is pain free.    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 Berenice Primas)    Patient Stated Goals "to walk (on campus & with dog), do stairs & carry handbag w/o pain; sleep on side"    Currently in Pain? No/denies    Pain Score 0-No pain    Multiple Pain Sites No              [facility] PT Assessment - 02/18/20 0001      Observation/Other Assessments   Focus on Therapeutic Outcomes (FOTO)  73% ( 27 % limitation)      Strength   Right/Left Hip Left    Left Hip Flexion 4+/5    Left Hip Extension 4/5    Left Hip External Rotation 4+/5    Left Hip Internal Rotation 4+/5    Left Hip ABduction 4+/5    Left Hip  ADduction 4+/5    Right/Left Knee Left                         [facility] Adult PT Treatment/Exercise - 02/18/20 0001      Knee/Hip Exercises: Stretches   Hip Flexor Stretch Left;Right;1 rep;20 seconds    Hip Flexor Stretch Limitations seated lunge position over edge mat table     Piriformis Stretch Left;30 seconds;2 reps    Piriformis Stretch Limitations Figure-4, KTOS      Knee/Hip Exercises: Aerobic   Recumbent Bike L2 x 6 min      Knee/Hip Exercises: Standing   Forward Lunges Right;Left;10 reps;3 seconds    Forward Lunges Limitations standing btw 2 chairs for UE support      Knee/Hip Exercises: Supine   Bridges Both;15 reps    Bridges Limitations 5" hold - focusing on full hip extension "lockouts"    Bridges with Clamshell Both;Right;Left;10 reps   Bridge + march x 1 each LE      Kinesiotix   Create Space 30% along ITB from insertion to proximal to  greater trochanter; cross strip from anterior to posterior starting mid TFL > over GT toward glute (30% stretch)                    PT Short Term Goals - 01/01/20 1111      PT SHORT TERM GOAL #1   Title Patient will be independent with initial HEP    Status Achieved   12/15/19     PT SHORT TERM GOAL #2   Title Patient to report pain reduction in frequency and intensity by >/= 25%    Status Achieved   7/19 - pt reporting 20-25% reduction in pain with walking            PT Long Term Goals - 02/18/20 1715      PT LONG TERM GOAL #1   Title Patient will be independent with ongoing/advanced HEP    Status Partially Met   01/18/20: met for current HEP (updated today)     PT LONG TERM GOAL #2   Title Patient to report pain reduction in frequency and intensity by >/= 75%    Baseline Prior goal: 50% improvement - met as of 01/13/20 (60% reduction in frequency of L hip pain & 50% improvement in intensity of pain)    Status Achieved   02/18/20     PT LONG TERM GOAL #3   Title Patient will demonstrate improved L  hip strength to >/= 4+/5 for improved stability and ease of mobility    Status Partially Met   02/18/20: met with exception of L hip extension strength 4/5     PT LONG TERM GOAL #4   Title Patient will improve walking tolerance to >/= 30 minutes w/o pain interference to allow her to walk across campus or take her dog for a walk    Status Achieved   02/18/20     PT LONG TERM GOAL #5   Title Patient will negotiate stairs reciprocally with normal step pattern w/o limitation due to L hip pain or weakness    Status Partially Met                 Plan - 02/18/20 1716    Clinical Impression Statement Pt. has made good progress with physical therapy.  Patricia Mclean reports a day free of L hip pain today.  Reports 75% reduction in L hip pain frequency and intensity since starting therapy.  LTG #2 met.  Able to partially achieve LTG #3 demonstrating 4+/5 L hip strength with MMT for all except L hip extension strength 4/5.  Therex today focused on improving glute activation with lunge, bridge, bridge march.  Reports she is now able to walk across campus and walk dog for 30 min duration without pain interference.  LTG #4 met.  LTG #5 remains partially met as pt. able to demonstrate improved tolerance for ascending stairs however still notes some weakness and L hip "tightness".  Reports she is performing lunge and side band resisted walking at home with improved tolerance/reduced muscular soreness following.  Will continue to benefit from further skilled therapy to improve tolerance for functional mobility.    Comorbidities L THA 08/22/18 secondary to AVN, R hip AVN, anxiety, depression    Rehab Potential Good    PT Frequency 2x / week    PT Duration 6 weeks    PT Treatment/Interventions ADLs/Self Care Home Management;Cryotherapy;Electrical Stimulation;Iontophoresis 56m/ml Dexamethasone;Moist Heat;Ultrasound;Gait training;Stair training;Functional mobility training;Therapeutic activities;Therapeutic  exercise;Balance training;Neuromuscular re-education;Patient/family education;Manual techniques;Scar mobilization;Passive range of  motion;Dry needling;Taping    PT Home Exercise Plan 7/1 - HS, ITB, glute/piriformis stretches; 7/8 - s/l clam, hip ABD/ext, hip flex/ext, front/back tap, CW/CCW circles, supine red TB clam, bridge + ABD isometric; 8/9 - red TB standing 4-way SLR; 8/27 - lunges, red TB lateral & fwd/back monster walk, fwd & lateral step-downs; 8/30 - seated hip flexor/quad stretch    Consulted and Agree with Plan of Care Patient           Patient will benefit from skilled therapeutic intervention in order to improve the following deficits and impairments:  Abnormal gait, Decreased activity tolerance, Decreased endurance, Decreased mobility, Decreased range of motion, Decreased scar mobility, Decreased strength, Difficulty walking, Increased fascial restricitons, Increased muscle spasms, Impaired flexibility, Improper body mechanics, Pain  Visit Diagnosis: Pain in left hip  Difficulty in walking, not elsewhere classified  Other symptoms and signs involving the musculoskeletal system  Other muscle spasm  Muscle weakness (generalized)     Problem List Patient Active Problem List   Diagnosis Date Noted  . Avascular necrosis of hip, left ([place]) 08/22/2018  . Avascular necrosis of femoral head, left ([place]) 08/22/2018    Bess Harvest, PTA 02/18/20 6:09 PM   [facility] [address]  [place], [place], [address] Phone: [phone]   Fax:  [phone]  Name: BASMA BUCHNER MRN: [id number] Date of Birth: 05/20/1971

## 2020-02-23 ENCOUNTER — Other Ambulatory Visit: Payer: Self-pay

## 2020-02-23 ENCOUNTER — Encounter: Payer: Self-pay | Admitting: Physical Therapy

## 2020-02-23 ENCOUNTER — Ambulatory Visit: Payer: No Typology Code available for payment source | Admitting: Physical Therapy

## 2020-02-23 DIAGNOSIS — M6281 Muscle weakness (generalized): Secondary | ICD-10-CM

## 2020-02-23 DIAGNOSIS — R29898 Other symptoms and signs involving the musculoskeletal system: Secondary | ICD-10-CM

## 2020-02-23 DIAGNOSIS — M25552 Pain in left hip: Secondary | ICD-10-CM

## 2020-02-23 DIAGNOSIS — M62838 Other muscle spasm: Secondary | ICD-10-CM

## 2020-02-23 DIAGNOSIS — R262 Difficulty in walking, not elsewhere classified: Secondary | ICD-10-CM

## 2020-02-23 NOTE — Therapy (Signed)
[facility] [address]  [place], [place], [address] Phone: [phone]   Fax:  [phone]  Physical Therapy Treatment  Patient Details  Name: Patricia Mclean MRN: [id number] Date of Birth: 05/22/1971 Referring Provider (PT): Mcarthur Rossetti, MD   Encounter Date: 02/23/2020   PT End of Session - 02/23/20 1658    Visit Number 21    Number of Visits 25    Date for PT Re-Evaluation 03/07/20    Authorization Type [employer] - VL: 17    PT Start Time 3704    PT Stop Time 1744    PT Time Calculation (min) 46 min    Activity Tolerance Patient tolerated treatment well    Behavior During Therapy [facility] for tasks assessed/performed           Past Medical History:  Diagnosis Date  . Anxiety   . GERD (gastroesophageal reflux disease)   . Headache    metoprolol and topamx  . Pneumonia    x3 had vaccine    Past Surgical History:  Procedure Laterality Date  . TONSILLECTOMY    . TOTAL HIP ARTHROPLASTY Left 08/22/2018   Procedure: TOTAL HIP ARTHROPLASTY ANTERIOR APPROACH;  Surgeon: Dorna Leitz, MD;  Location: [facility];  Service: Orthopedics;  Laterality: Left;    There were no vitals filed for this visit.   Subjective Assessment - 02/23/20 1701    Subjective Pt notes some soreness in anterolateral hip whcih she feels is resulting from the side stepping and monster walks with the red TB. Feels like DN upon return from her trip helped alot.    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 Berenice Primas)    Patient Stated Goals "to walk (on campus & with dog), do stairs & carry handbag w/o pain; sleep on side"    Currently in Pain? Yes    Pain Score 4    3-4/10 with mobility; 5/10 with palpation   Pain Location Hip    Pain Orientation Left;Anterior;Lateral    Pain Descriptors / Indicators Sore    Pain Type Chronic pain    Pain Frequency Intermittent                             [facility] Adult  PT Treatment/Exercise - 02/23/20 1658      Knee/Hip Exercises: Aerobic   Recumbent Bike L2 x 6 min      Knee/Hip Exercises: Standing   Hip Flexion Left;10 reps;Stengthening;Knee straight    Hip Flexion Limitations green TB; UE support on back of chair for balance    Hip ADduction Left;10 reps;Strengthening    Hip ADduction Limitations green TB; UE support on back of chair for balance    Hip Abduction Left;10 reps;Stengthening;Knee straight    Abduction Limitations green TB, cues to avoid hip ER; UE support on back of chair for balance    Hip Extension Left;10 reps;Stengthening;Knee straight    Extension Limitations green TB; UE support on back of chair for balance    Other Standing Knee Exercises B side stepping & fwd/nack monster walk with red TB at ankles 2 x 25 ft - corrected LE alignment to reduce hip ER to better target glutes and avoid irriitating TFL      Manual Therapy   Manual Therapy Soft tissue mobilization;Myofascial release    Manual therapy comments skilled palpation and monitoring during DN     Soft tissue  mobilization STM/DTM to L TFL & lateral glutes    Myofascial Release manual TPR to L TFL & lateral glute med/min - decreased ttp following DN             Trigger Point Dry Needling - 02/23/20 1658    Consent Given? Yes    Muscles Treated Back/Hip Tensor fascia lata;Gluteus minimus;Gluteus medius   Lt   Gluteus Minimus Response Twitch response elicited;Palpable increased muscle length    Gluteus Medius Response Twitch response elicited;Palpable increased muscle length    Tensor Fascia Lata Response Twitch response elicited;Palpable increased muscle length                  PT Short Term Goals - 01/01/20 1111      PT SHORT TERM GOAL #1   Title Patient will be independent with initial HEP    Status Achieved   12/15/19     PT SHORT TERM GOAL #2   Title Patient to report pain reduction in frequency and intensity by >/= 25%    Status Achieved   7/19 - pt  reporting 20-25% reduction in pain with walking            PT Long Term Goals - 02/18/20 1715      PT LONG TERM GOAL #1   Title Patient will be independent with ongoing/advanced HEP    Status Partially Met   01/18/20: met for current HEP (updated today)     PT LONG TERM GOAL #2   Title Patient to report pain reduction in frequency and intensity by >/= 75%    Baseline Prior goal: 50% improvement - met as of 01/13/20 (60% reduction in frequency of L hip pain & 50% improvement in intensity of pain)    Status Achieved   02/18/20     PT LONG TERM GOAL #3   Title Patient will demonstrate improved L hip strength to >/= 4+/5 for improved stability and ease of mobility    Status Partially Met   02/18/20: met with exception of L hip extension strength 4/5     PT LONG TERM GOAL #4   Title Patient will improve walking tolerance to >/= 30 minutes w/o pain interference to allow her to walk across campus or take her dog for a walk    Status Achieved   02/18/20     PT LONG TERM GOAL #5   Title Patient will negotiate stairs reciprocally with normal step pattern w/o limitation due to L hip pain or weakness    Status Partially Met                 Plan - 02/23/20 1706    Clinical Impression Statement [name] notes increased anterolateral hip soreness today which she attributes to the red TB lateral band walking and monster walk - reviewed exercises with her and noted that she was tending to ER her hips likely leading to excessive strain on TFL, therefore corrected positioning/alignment with pt able to feel better glute recruitment as desired with exercises. Addressed irritation and increased muscle tension in TFL and lateral glutes with manual therapy and DN with palpable reduction in muscle tension and decreased ttp following MT & DN. Pt also noting red TB resistance with standing 4-way SLR getting easier, therefore progressed to green TB with good tolerance. Will continue to review and update HEP  as indicated in remaining visits to prepare for transition to HEP at end of current episode.    Comorbidities L THA  08/22/18 secondary to AVN, R hip AVN, anxiety, depression    Rehab Potential Good    PT Frequency 2x / week    PT Duration 6 weeks    PT Treatment/Interventions ADLs/Self Care Home Management;Cryotherapy;Electrical Stimulation;Iontophoresis 59m/ml Dexamethasone;Moist Heat;Ultrasound;Gait training;Stair training;Functional mobility training;Therapeutic activities;Therapeutic exercise;Balance training;Neuromuscular re-education;Patient/family education;Manual techniques;Scar mobilization;Passive range of motion;Dry needling;Taping    PT Next Visit Plan L hip flexibility & strengthening with emphasis on glutes/hip extension; manual therapy with potential DN to address abnormal muscle tension in proximal L LE muscles PRN; kinesiotaping as continued benefit noted with potential training in self-application; modalities PRN    PT Home Exercise Plan 7/1 - HS, ITB, glute/piriformis stretches; 7/8 - s/l clam, hip ABD/ext, hip flex/ext, front/back tap, CW/CCW circles, supine red TB clam, bridge + ABD isometric; 8/9 - red TB (progressed to green TB 9/14) standing 4-way SLR; 8/27 - lunges, red TB lateral & fwd/back monster walk, fwd & lateral step-downs; 8/30 - seated hip flexor/quad stretch    Consulted and Agree with Plan of Care Patient           Patient will benefit from skilled therapeutic intervention in order to improve the following deficits and impairments:  Abnormal gait, Decreased activity tolerance, Decreased endurance, Decreased mobility, Decreased range of motion, Decreased scar mobility, Decreased strength, Difficulty walking, Increased fascial restricitons, Increased muscle spasms, Impaired flexibility, Improper body mechanics, Pain  Visit Diagnosis: Pain in left hip  Difficulty in walking, not elsewhere classified  Other symptoms and signs involving the musculoskeletal  system  Other muscle spasm  Muscle weakness (generalized)     Problem List Patient Active Problem List   Diagnosis Date Noted  . Avascular necrosis of [place], left ([place] 08/22/2018  . [place], [place] 08/22/2018    JPercival Spanish PT, MPT 02/23/2020, 6:10 PM  [facility] [address]Phone: [phone]  Fax:  [phone] Name: SPATINA SPANIERMRN: [id number] of Birth: 110-22-1972

## 2020-02-25 ENCOUNTER — Other Ambulatory Visit: Payer: Self-pay

## 2020-02-25 ENCOUNTER — Ambulatory Visit: Payer: No Typology Code available for payment source

## 2020-02-25 DIAGNOSIS — M62838 Other muscle spasm: Secondary | ICD-10-CM

## 2020-02-25 DIAGNOSIS — M25552 Pain in left hip: Secondary | ICD-10-CM | POA: Diagnosis not present

## 2020-02-25 DIAGNOSIS — M6281 Muscle weakness (generalized): Secondary | ICD-10-CM

## 2020-02-25 DIAGNOSIS — R262 Difficulty in walking, not elsewhere classified: Secondary | ICD-10-CM

## 2020-02-25 DIAGNOSIS — R29898 Other symptoms and signs involving the musculoskeletal system: Secondary | ICD-10-CM

## 2020-02-25 NOTE — Therapy (Signed)
[facility] [address]  [place], [place], [address] Phone: [phone]   Fax:  [phone]  Physical Therapy Treatment  Patient Details  Name: Patricia Mclean MRN: [id number] Date of Birth: May 21, 1971 Referring Provider (PT): Mcarthur Rossetti, MD   Encounter Date: 02/25/2020   PT End of Session - 02/25/20 1733    Visit Number 22    Number of Visits 25    Date for PT Re-Evaluation 03/07/20    Authorization Type [employer] - VL: 36    PT Start Time 5859    PT Stop Time 1750    PT Time Calculation (min) 46 min    Activity Tolerance Patient tolerated treatment well    Behavior During Therapy [facility] for tasks assessed/performed           Past Medical History:  Diagnosis Date  . Anxiety   . GERD (gastroesophageal reflux disease)   . Headache    metoprolol and topamx  . Pneumonia    x3 had vaccine    Past Surgical History:  Procedure Laterality Date  . TONSILLECTOMY    . TOTAL HIP ARTHROPLASTY Left 08/22/2018   Procedure: TOTAL HIP ARTHROPLASTY ANTERIOR APPROACH;  Surgeon: Dorna Leitz, MD;  Location: [facility];  Service: Orthopedics;  Laterality: Left;    There were no vitals filed for this visit.   Subjective Assessment - 02/25/20 1729    Subjective Pt. noting her L anterior/lateral hip soreness flare-up has not yet subsided.    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 Berenice Primas)    Patient Stated Goals "to walk (on campus & with dog), do stairs & carry handbag w/o pain; sleep on side"    Currently in Pain? Yes    Pain Score 3     Pain Location Hip    Pain Orientation Left;Anterior;Lateral    Pain Descriptors / Indicators Sore    Pain Type Chronic pain    Pain Radiating Towards denies today    Pain Frequency Constant    Multiple Pain Sites No                             [facility] Adult PT Treatment/Exercise - 02/25/20 0001      Knee/Hip Exercises: Stretches   Hip  Flexor Stretch Left;1 rep;30 seconds    Hip Flexor Stretch Limitations mod thomas stretch       Knee/Hip Exercises: Aerobic   Nustep L4 x 6 min (UE/LE)      Knee/Hip Exercises: Standing   Forward Lunges Right;Left;5 reps;2 sets    Forward Lunges Limitations two hand support     Lateral Step Up Left;5 reps;Hand Hold: 2;Step Height: 8"    Lateral Step Up Limitations 2 hand support at [employer] Step Up Left;10 reps;Hand Hold: 1    Forward Step Up Limitations forward step-up onto TM ~ 9"       Knee/Hip Exercises: Supine   Bridges Both;15 reps   cues for increased ROM to create hip flexor stretch    Bridges Limitations + isometric hip abd/ER into green TB       Knee/Hip Exercises: Sidelying   Clams L clam shell x 15      Manual Therapy   Manual Therapy Passive ROM;Soft tissue mobilization;Myofascial release    Soft tissue mobilization DTM to L TFL    Myofascial Release TPR to L TFL  Passive ROM Manual L glute, piriformis, TFL, hip flexors, HS, ITB stretch with therapist x 30 sec       Kinesiotix   Create Space 30% along ITB from insertion to proximal to greater trochanter; cross strip from anterior to posterior starting mid TFL > over GT toward glute (30% stretch)                    PT Short Term Goals - 01/01/20 1111      PT SHORT TERM GOAL #1   Title Patient will be independent with initial HEP    Status Achieved   12/15/19     PT SHORT TERM GOAL #2   Title Patient to report pain reduction in frequency and intensity by >/= 25%    Status Achieved   7/19 - pt reporting 20-25% reduction in pain with walking            PT Long Term Goals - 02/18/20 1715      PT LONG TERM GOAL #1   Title Patient will be independent with ongoing/advanced HEP    Status Partially Met   01/18/20: met for current HEP (updated today)     PT LONG TERM GOAL #2   Title Patient to report pain reduction in frequency and intensity by >/= 75%    Baseline Prior goal: 50% improvement - met  as of 01/13/20 (60% reduction in frequency of L hip pain & 50% improvement in intensity of pain)    Status Achieved   02/18/20     PT LONG TERM GOAL #3   Title Patient will demonstrate improved L hip strength to >/= 4+/5 for improved stability and ease of mobility    Status Partially Met   02/18/20: met with exception of L hip extension strength 4/5     PT LONG TERM GOAL #4   Title Patient will improve walking tolerance to >/= 30 minutes w/o pain interference to allow her to walk across campus or take her dog for a walk    Status Achieved   02/18/20     PT LONG TERM GOAL #5   Title Patient will negotiate stairs reciprocally with normal step pattern w/o limitation due to L hip pain or weakness    Status Partially Met                 Plan - 02/25/20 1757    Clinical Impression Statement Patricia Mclean reporting some remaining L anterior/lateral hip soreness which seems to be present in L TFL with palpation today.  MT focused on reducing tension and improving tissue quality in L TFL for improved activity tolerance with HEP.  Progressed height with L forward, lateral step ups and continue lunge with hand support.  Pt. able to perform these activities without L hip pain however did avoid active hip flexor/TFL strengthening today to reduce strain as tissue recovers from recent flare-up.  Ended visit with L hip Rock Tape application per pt. request as pt. notes good ongoing pain relief from this modality.    Comorbidities L THA 08/22/18 secondary to AVN, R hip AVN, anxiety, depression    Rehab Potential Good    PT Frequency 2x / week    PT Duration 6 weeks    PT Treatment/Interventions ADLs/Self Care Home Management;Cryotherapy;Electrical Stimulation;Iontophoresis 47m/ml Dexamethasone;Moist Heat;Ultrasound;Gait training;Stair training;Functional mobility training;Therapeutic activities;Therapeutic exercise;Balance training;Neuromuscular re-education;Patient/family education;Manual techniques;Scar  mobilization;Passive range of motion;Dry needling;Taping    PT Next Visit Plan L hip flexibility & strengthening with emphasis  on glutes/hip extension; manual therapy with potential DN to address abnormal muscle tension in proximal L LE muscles PRN; kinesiotaping as continued benefit noted with potential training in self-application; modalities PRN    PT Home Exercise Plan 7/1 - HS, ITB, glute/piriformis stretches; 7/8 - s/l clam, hip ABD/ext, hip flex/ext, front/back tap, CW/CCW circles, supine red TB clam, bridge + ABD isometric; 8/9 - red TB (progressed to green TB 9/14) standing 4-way SLR; 8/27 - lunges, red TB lateral & fwd/back monster walk, fwd & lateral step-downs; 8/30 - seated hip flexor/quad stretch    Consulted and Agree with Plan of Care Patient           Patient will benefit from skilled therapeutic intervention in order to improve the following deficits and impairments:  Abnormal gait, Decreased activity tolerance, Decreased endurance, Decreased mobility, Decreased range of motion, Decreased scar mobility, Decreased strength, Difficulty walking, Increased fascial restricitons, Increased muscle spasms, Impaired flexibility, Improper body mechanics, Pain  Visit Diagnosis: Pain in left hip  Difficulty in walking, not elsewhere classified  Other symptoms and signs involving the musculoskeletal system  Other muscle spasm  Muscle weakness (generalized)     Problem List Patient Active Problem List   Diagnosis Date Noted  . Avascular necrosis of hip, left ([place]) 08/22/2018  . Avascular necrosis of femoral head, left ([place]) 08/22/2018    Patricia Mclean, PTA 02/25/20 6:06 PM   [facility] [address]  [place], [place], [address] Phone: [phone]   Fax:  [phone]  Name: Patricia Mclean MRN: [id number] Date of Birth: 02-10-1971

## 2020-02-29 ENCOUNTER — Other Ambulatory Visit: Payer: Self-pay

## 2020-02-29 ENCOUNTER — Encounter: Payer: Self-pay | Admitting: Physical Therapy

## 2020-02-29 ENCOUNTER — Ambulatory Visit: Payer: No Typology Code available for payment source | Admitting: Physical Therapy

## 2020-02-29 DIAGNOSIS — R262 Difficulty in walking, not elsewhere classified: Secondary | ICD-10-CM

## 2020-02-29 DIAGNOSIS — M62838 Other muscle spasm: Secondary | ICD-10-CM

## 2020-02-29 DIAGNOSIS — M25552 Pain in left hip: Secondary | ICD-10-CM

## 2020-02-29 DIAGNOSIS — M6281 Muscle weakness (generalized): Secondary | ICD-10-CM

## 2020-02-29 DIAGNOSIS — R29898 Other symptoms and signs involving the musculoskeletal system: Secondary | ICD-10-CM

## 2020-02-29 NOTE — Therapy (Signed)
[facility] [address]  [place], [place], [address] Phone: [phone]   Fax:  [phone]  Physical Therapy Treatment  Patient Details  Name: Patricia Mclean MRN: [id number] Date of Birth: 16-Jun-1970 Referring Provider (PT): Mcarthur Rossetti, MD   Encounter Date: 02/29/2020   PT End of Session - 02/29/20 1704    Visit Number 23    Number of Visits 25    Date for PT Re-Evaluation 03/07/20    Authorization Type [employer] - VL: 28    PT Start Time 5638    PT Stop Time 1751    PT Time Calculation (min) 47 min    Activity Tolerance Patient tolerated treatment well    Behavior During Therapy [facility] for tasks assessed/performed           Past Medical History:  Diagnosis Date  . Anxiety   . GERD (gastroesophageal reflux disease)   . Headache    metoprolol and topamx  . Pneumonia    x3 had vaccine    Past Surgical History:  Procedure Laterality Date  . TONSILLECTOMY    . TOTAL HIP ARTHROPLASTY Left 08/22/2018   Procedure: TOTAL HIP ARTHROPLASTY ANTERIOR APPROACH;  Surgeon: Dorna Leitz, MD;  Location: [facility];  Service: Orthopedics;  Laterality: Left;    There were no vitals filed for this visit.   Subjective Assessment - 02/29/20 1707    Subjective Pt reports she still finds herself avoiding stairs but feels this is more out of habit than inability to navigate stairs. Pt requesting DN for ongoing L anterolateral hip pain.    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 Berenice Primas)    Patient Stated Goals "to walk (on campus & with dog), do stairs & carry handbag w/o pain; sleep on side"    Currently in Pain? Yes    Pain Score 3     Pain Location Hip    Pain Orientation Left;Anterior;Lateral    Pain Descriptors / Indicators Sharp    Pain Type Chronic pain    Pain Frequency Intermittent                             [facility] Adult PT Treatment/Exercise - 02/29/20 1704       Self-Care   Other Self-Care Comments  Provided instruction in self-application of kinesiotape to L ITB & greater trochanter      Exercises   Exercises Knee/Hip      Knee/Hip Exercises: Stretches   Other Knee/Hip Stretches L sidelying QL stretch with leg extended back over edge of treatment table and with hip flexed & ER over front of treatment table x 30 sec each - pt preferring the former      Knee/Hip Exercises: Aerobic   Recumbent Bike L2 x 6 min      Manual Therapy   Manual Therapy Soft tissue mobilization;Myofascial release    Manual therapy comments skilled palpation and monitoring during DN     Soft tissue mobilization STM/DTM to L iliacus, TFL, lateral glutes & QL    Myofascial Release manual TPR to L iliacus, TFL, lateral glute med/min & QL - decreased ttp following DN       Kinesiotix   Create Space 50% star over L greater trochanter using tail of piriformis strip    Inhibit Muscle  30% along ITB from insertion to proximal to greater trochanter  Trigger Point Dry Needling - 02/29/20 1704    Consent Given? Yes    Muscles Treated Back/Hip Tensor fascia lata;Gluteus minimus;Gluteus medius;Gluteus maximus;Quadratus lumborum;Iliacus   Lt   Electrical Stimulation Performed with Dry Needling Yes    E-stim with Dry Needling Details L TFL & glutes    Gluteus Minimus Response Twitch response elicited;Palpable increased muscle length    Gluteus Medius Response Twitch response elicited;Palpable increased muscle length    Gluteus Maximus Response Twitch response elicited;Palpable increased muscle length    Tensor Fascia Lata Response Twitch response elicited;Palpable increased muscle length    Iliacus Response Twitch response elicited;Palpable increased muscle length    Quadratus Lumborum Response Twitch response elicited;Palpable increased muscle length                PT Education - 02/29/20 1751    Education Details HEP update - QL stretch; Kinesiotape  application    Person(s) Educated Patient    Methods Explanation;Demonstration;Verbal cues;Handout    Comprehension Verbalized understanding;Verbal cues required;Returned demonstration;Need further instruction            PT Short Term Goals - 01/01/20 1111      PT SHORT TERM GOAL #1   Title Patient will be independent with initial HEP    Status Achieved   12/15/19     PT SHORT TERM GOAL #2   Title Patient to report pain reduction in frequency and intensity by >/= 25%    Status Achieved   7/19 - pt reporting 20-25% reduction in pain with walking            PT Long Term Goals - 02/18/20 1715      PT LONG TERM GOAL #1   Title Patient will be independent with ongoing/advanced HEP    Status Partially Met   01/18/20: met for current HEP (updated today)     PT LONG TERM GOAL #2   Title Patient to report pain reduction in frequency and intensity by >/= 75%    Baseline Prior goal: 50% improvement - met as of 01/13/20 (60% reduction in frequency of L hip pain & 50% improvement in intensity of pain)    Status Achieved   02/18/20     PT LONG TERM GOAL #3   Title Patient will demonstrate improved L hip strength to >/= 4+/5 for improved stability and ease of mobility    Status Partially Met   02/18/20: met with exception of L hip extension strength 4/5     PT LONG TERM GOAL #4   Title Patient will improve walking tolerance to >/= 30 minutes w/o pain interference to allow her to walk across campus or take her dog for a walk    Status Achieved   02/18/20     PT LONG TERM GOAL #5   Title Patient will negotiate stairs reciprocally with normal step pattern w/o limitation due to L hip pain or weakness    Status Partially Met                 Plan - 02/29/20 1751    Clinical Impression Statement Patricia Mclean states she has started back with stepping in monster walk patterns with increased awareness of foot/LE alignment but has deferred using the therabands - no pain noted with unresisted  motion but notes some lingering sharp pain and soreness in L anterolateral hip from previous attempts which she thinks led to the flare-up. She requested DN today to see if it would help alleviate the residual  pain, therefore performed manual therapy with DN addressing TFL, lateral glutes, QL and iliacus - good twitch responses elicited, esp in QL, with palpable reduction in muscle tension and ttp. Session concluded with training in self application of kinesiotape to allow for future use as needed after completion of current PT episode. Two visits remaining in which we plan to finalize her HEP and address any further concerns relating to anticipated transition to the HEP at the end of the current episode.    Comorbidities L THA 08/22/18 secondary to AVN, R hip AVN, anxiety, depression    Rehab Potential Good    PT Frequency 2x / week    PT Duration 6 weeks    PT Treatment/Interventions ADLs/Self Care Home Management;Cryotherapy;Electrical Stimulation;Iontophoresis 94m/ml Dexamethasone;Moist Heat;Ultrasound;Gait training;Stair training;Functional mobility training;Therapeutic activities;Therapeutic exercise;Balance training;Neuromuscular re-education;Patient/family education;Manual techniques;Scar mobilization;Passive range of motion;Dry needling;Taping    PT Next Visit Plan final HEP review/update    PT Home Exercise Plan 7/1 - HS, ITB, glute/piriformis stretches; 7/8 - s/l clam, hip ABD/ext, hip flex/ext, front/back tap, CW/CCW circles, supine red TB clam, bridge + ABD isometric; 8/9 - red TB (progressed to green TB 9/14) standing 4-way SLR; 8/27 - lunges, red TB lateral & fwd/back monster walk, fwd & lateral step-downs; 8/30 - seated hip flexor/quad stretch; 9/20 - sidelying QL stretch    Consulted and Agree with Plan of Care Patient           Patient will benefit from skilled therapeutic intervention in order to improve the following deficits and impairments:  Abnormal gait, Decreased activity  tolerance, Decreased endurance, Decreased mobility, Decreased range of motion, Decreased scar mobility, Decreased strength, Difficulty walking, Increased fascial restricitons, Increased muscle spasms, Impaired flexibility, Improper body mechanics, Pain  Visit Diagnosis: Pain in left hip  Difficulty in walking, not elsewhere classified  Other symptoms and signs involving the musculoskeletal system  Other muscle spasm  Muscle weakness (generalized)     Problem List Patient Active Problem List   Diagnosis Date Noted  . Avascular necrosis of hip, left ([facility] 08/22/2018  . [place] [place] 08/22/2018    JPercival Spanish PT, MPT 02/29/2020, 6:19 PM  [facility] [place] [address]Phone: [phone]  Fax:  [phone] Name: Patricia ADELSTEINMRN: [id number] of Birth: 105/18/1972

## 2020-02-29 NOTE — Patient Instructions (Signed)
    Home exercise program created by Tacuma Graffam, PT.  For questions, please contact Rex Magee via phone at [phone] or email at [email or web address]  [facility] [address]  [address] [place], [place], [address] Phone: [phone]   Fax:  [phone]    

## 2020-03-03 ENCOUNTER — Other Ambulatory Visit: Payer: Self-pay

## 2020-03-03 ENCOUNTER — Encounter: Payer: Self-pay | Admitting: Physical Therapy

## 2020-03-03 ENCOUNTER — Ambulatory Visit: Payer: No Typology Code available for payment source | Admitting: Physical Therapy

## 2020-03-03 DIAGNOSIS — R29898 Other symptoms and signs involving the musculoskeletal system: Secondary | ICD-10-CM

## 2020-03-03 DIAGNOSIS — M25552 Pain in left hip: Secondary | ICD-10-CM | POA: Diagnosis not present

## 2020-03-03 DIAGNOSIS — R262 Difficulty in walking, not elsewhere classified: Secondary | ICD-10-CM

## 2020-03-03 DIAGNOSIS — M6281 Muscle weakness (generalized): Secondary | ICD-10-CM

## 2020-03-03 DIAGNOSIS — M62838 Other muscle spasm: Secondary | ICD-10-CM

## 2020-03-03 NOTE — Patient Instructions (Signed)
    Home exercise program created by Glenetta Hew, PT.  For questions, please contact Annlee Glandon via phone at [phone] or email at [email or web address]  [facility] [address]  [address] [place], [place], [address] Phone: [phone]   Fax:  [phone]

## 2020-03-03 NOTE — Therapy (Signed)
[facility] [address]  [place], [place], [address] Phone: [phone]   Fax:  [phone]  Physical Therapy Treatment  Patient Details  Name: Patricia Mclean MRN: [id number] Date of Birth: 1971/01/01 Referring Provider (PT): Mcarthur Rossetti, MD   Encounter Date: 03/03/2020   PT End of Session - 03/03/20 1657    Visit Number 24    Number of Visits 25    Date for PT Re-Evaluation 03/07/20    Authorization Type [employer] - VL: 24    PT Start Time 1657    PT Stop Time 3013    PT Time Calculation (min) 50 min    Activity Tolerance Patient tolerated treatment well    Behavior During Therapy [facility] for tasks assessed/performed           Past Medical History:  Diagnosis Date  . Anxiety   . GERD (gastroesophageal reflux disease)   . Headache    metoprolol and topamx  . Pneumonia    x3 had vaccine    Past Surgical History:  Procedure Laterality Date  . TONSILLECTOMY    . TOTAL HIP ARTHROPLASTY Left 08/22/2018   Procedure: TOTAL HIP ARTHROPLASTY ANTERIOR APPROACH;  Surgeon: Dorna Leitz, MD;  Location: [facility];  Service: Orthopedics;  Laterality: Left;    There were no vitals filed for this visit.   Subjective Assessment - 03/03/20 1657    Subjective Pt reports DN last visit helped a lot. Pt notes more fatigue than pain today, but states that she had to stand a lot yesterday.    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 Berenice Mclean)    Patient Stated Goals "to walk (on campus & with dog), do stairs & carry handbag w/o pain; sleep on side"    Currently in Pain? No/denies                             [facility] Adult PT Treatment/Exercise - 03/03/20 1657      Exercises   Exercises Knee/Hip      Knee/Hip Exercises: Aerobic   Recumbent Bike L2 x 6 min      Knee/Hip Exercises: Standing   Step Down Right;Left;Step Height: 4";Hand Hold: 2;10 reps    Step Down Limitations lateral  step-down with heel tap    Other Standing Knee Exercises R/L hip hike from low step x 10    Other Standing Knee Exercises L/R SLS on low step + hip flex/ext, tick-tock arc front/back & CW/CCW circles x 10 each      Knee/Hip Exercises: Sidelying   Clams L clam shell +/- yellow TB x 15 - knees aligned with torso and feet tucked back    Other Sidelying Knee/Hip Exercises B side plank 10 x 3"; R sustained side plank + L clam x 10      Knee/Hip Exercises: Prone   Hip Extension Right;Left;10 reps;Strengthening    Hip Extension Limitations quadruped bent knee    Other Prone Exercises Quadruped fire hydrants 10 x 3"      Manual Therapy   Other Manual Therapy Provided instruction in use of foam roller for self-STM to glutes/piriformis, HS, ITB & quads.                  PT Education - 03/03/20 1745    Education Details HEP review & update - foam rolling for glutes/piriformis, HS, ITB and quads  Person(s) Educated Patient    Methods Explanation;Demonstration;Verbal cues;Handout    Comprehension Verbalized understanding;Verbal cues required;Returned demonstration            PT Short Term Goals - 01/01/20 1111      PT SHORT TERM GOAL #1   Title Patient will be independent with initial HEP    Status Achieved   12/15/19     PT SHORT TERM GOAL #2   Title Patient to report pain reduction in frequency and intensity by >/= 25%    Status Achieved   7/19 - pt reporting 20-25% reduction in pain with walking            PT Long Term Goals - 02/18/20 1715      PT LONG TERM GOAL #1   Title Patient will be independent with ongoing/advanced HEP    Status Partially Met   01/18/20: met for current HEP (updated today)     PT LONG TERM GOAL #2   Title Patient to report pain reduction in frequency and intensity by >/= 75%    Baseline Prior goal: 50% improvement - met as of 01/13/20 (60% reduction in frequency of L hip pain & 50% improvement in intensity of pain)    Status Achieved   02/18/20      PT LONG TERM GOAL #3   Title Patient will demonstrate improved L hip strength to >/= 4+/5 for improved stability and ease of mobility    Status Partially Met   02/18/20: met with exception of L hip extension strength 4/5     PT LONG TERM GOAL #4   Title Patient will improve walking tolerance to >/= 30 minutes w/o pain interference to allow her to walk across campus or take her dog for a walk    Status Achieved   02/18/20     PT LONG TERM GOAL #5   Title Patient will negotiate stairs reciprocally with normal step pattern w/o limitation due to L hip pain or weakness    Status Partially Met                 Plan - 03/03/20 1701    Clinical Impression Statement Patricia Mclean reports good relief from DN last visit and notes her standing tolerance seems to be improving with no increased pain after being on her feet more yesterday although fatigue still noted. HEP reviewed targeting exercises that she still finds more challenging and/or uncomfortable with modification and/or alternative options provided. Pt encouraged to complete stretches daily but vary strengthening exercises aiming for 2-3 days per week for each exercise. Pt also noting that she has a foam roller available at home from her prior virtual PT episode, therefore provided instruction in self-STM using foam roller for muscle groups where she typically experiences pain and/or tightness. Will plan for final goal assessment and address any remaining questions or concerns next visit with anticipated transition to HEP +/- 30-day hold.    Comorbidities L THA 08/22/18 secondary to AVN, R hip AVN, anxiety, depression    Rehab Potential Good    PT Frequency 2x / week    PT Duration 6 weeks    PT Treatment/Interventions ADLs/Self Care Home Management;Cryotherapy;Electrical Stimulation;Iontophoresis 4mg /ml Dexamethasone;Moist Heat;Ultrasound;Gait training;Stair training;Functional mobility training;Therapeutic activities;Therapeutic  exercise;Balance training;Neuromuscular re-education;Patient/family education;Manual techniques;Scar mobilization;Passive range of motion;Dry needling;Taping    PT Next Visit Plan discharge assessment with transition to HEP +/- 30-day hold    PT Home Exercise Plan 7/1 - HS, ITB, glute/piriformis stretches; 7/8 - s/l clam,  hip ABD/ext, hip flex/ext, front/back tap, CW/CCW circles, supine red TB clam, bridge + ABD isometric; 8/9 - red TB (progressed to green TB 9/14) standing 4-way SLR; 8/27 - lunges, red TB lateral & fwd/back monster walk, fwd & lateral step-downs; 8/30 - seated hip flexor/quad stretch; 9/20 - sidelying QL stretch    Consulted and Agree with Plan of Care Patient           Patient will benefit from skilled therapeutic intervention in order to improve the following deficits and impairments:  Abnormal gait, Decreased activity tolerance, Decreased endurance, Decreased mobility, Decreased range of motion, Decreased scar mobility, Decreased strength, Difficulty walking, Increased fascial restricitons, Increased muscle spasms, Impaired flexibility, Improper body mechanics, Pain  Visit Diagnosis: Pain in left hip  Difficulty in walking, not elsewhere classified  Other symptoms and signs involving the musculoskeletal system  Other muscle spasm  Muscle weakness (generalized)     Problem List Patient Active Problem List   Diagnosis Date Noted  . Avascular necrosis of hip, left ([place]) 08/22/2018  . Avascular necrosis of femoral head, left ([place]) 08/22/2018    Percival Spanish, PT, MPT 03/03/2020, 6:09 PM  [facility] [address]  [address] [place], [place], [address] Phone: [phone]   Fax:  [phone]  Name: LEAHMARIE GASIOROWSKI MRN: [id number] Date of Birth: 1971-03-14

## 2020-03-07 ENCOUNTER — Ambulatory Visit: Payer: No Typology Code available for payment source | Admitting: Physical Therapy

## 2020-03-10 ENCOUNTER — Ambulatory Visit: Payer: No Typology Code available for payment source | Admitting: Physical Therapy

## 2020-03-10 ENCOUNTER — Other Ambulatory Visit: Payer: Self-pay

## 2020-03-10 ENCOUNTER — Encounter: Payer: Self-pay | Admitting: Physical Therapy

## 2020-03-10 DIAGNOSIS — M25552 Pain in left hip: Secondary | ICD-10-CM | POA: Diagnosis not present

## 2020-03-10 DIAGNOSIS — R262 Difficulty in walking, not elsewhere classified: Secondary | ICD-10-CM

## 2020-03-10 DIAGNOSIS — R29898 Other symptoms and signs involving the musculoskeletal system: Secondary | ICD-10-CM

## 2020-03-10 DIAGNOSIS — M6281 Muscle weakness (generalized): Secondary | ICD-10-CM

## 2020-03-10 DIAGNOSIS — M62838 Other muscle spasm: Secondary | ICD-10-CM

## 2020-03-10 NOTE — Therapy (Addendum)
[facility] [place] [address]  [place], [place], [address] Phone: [phone]   Fax:  [phone]  Physical Therapy Treatment / Progress Note / Discharge Summary  Patient Details  Name: RASHADA KLONTZ MRN: [id number] Date of Birth: 1970/08/28 Referring Provider (PT): Mcarthur Rossetti, MD   Encounter Date: 03/10/2020   PT End of Session - 03/10/20 0844    Visit Number 25    Number of Visits 25    Date for PT Re-Evaluation 03/07/20    Authorization Type [employer] - VL: 97    PT Start Time 1607    PT Stop Time 0924    PT Time Calculation (min) 40 min    Activity Tolerance Patient tolerated treatment well    Behavior During Therapy [facility] for tasks assessed/performed           Past Medical History:  Diagnosis Date  . Anxiety   . GERD (gastroesophageal reflux disease)   . Headache    metoprolol and topamx  . Pneumonia    x3 had vaccine    Past Surgical History:  Procedure Laterality Date  . TONSILLECTOMY    . TOTAL HIP ARTHROPLASTY Left 08/22/2018   Procedure: TOTAL HIP ARTHROPLASTY ANTERIOR APPROACH;  Surgeon: Dorna Leitz, MD;  Location: [facility];  Service: Orthopedics;  Laterality: Left;    There were no vitals filed for this visit.   Subjective Assessment - 03/10/20 0849    Subjective Pt reports she looked into ordering RockTape but has some questions about size of roll - clarified individual size roll = 5 M and bulk/large roll = 32 M.    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 Berenice Primas)    Patient Stated Goals "to walk (on campus & with dog), do stairs & carry handbag w/o pain; sleep on side"    Currently in Pain? No/denies              [facility] PT Assessment - 03/10/20 0844      Assessment   Medical Diagnosis L hip trochanteric bursitis    Referring Provider (PT) Mcarthur Rossetti, MD    Onset Date/Surgical Date 08/22/18    Next MD Visit Dec 2021      Observation/Other  Assessments   Focus on Therapeutic Outcomes (FOTO)  Hip - 78% (22% limitation)      Strength   Right Hip Flexion 5/5    Right Hip Extension 5/5    Right Hip External Rotation  4+/5    Right Hip Internal Rotation 5/5    Right Hip ABduction 5/5    Right Hip ADduction 5/5    Left Hip Flexion 5/5    Left Hip Extension 5/5    Left Hip External Rotation 4/5    Left Hip Internal Rotation 5/5    Left Hip ABduction 5/5    Left Hip ADduction 5/5    Right Knee Flexion 5/5    Right Knee Extension 5/5    Left Knee Flexion 5/5    Left Knee Extension 5/5      Ambulation/Gait   Stairs Assistance 7: Independent    Stair Management Technique No rails;Alternating pattern    Number of Stairs 14    Height of Stairs 7    Gait Comments Reciprocal stair negotiation with normal gait pattern and no evidence of hesistance or instability.  [facility] PT Treatment/Exercise - 03/10/20 0844      Exercises   Exercises Knee/Hip      Knee/Hip Exercises: Sidelying   Clams L clam shell + red TB x 10 in side plank with with knees aligned with torso and feet tucked back      Knee/Hip Exercises: Prone   Other Prone Exercises Quadruped fire hydrants with red TB 10 x 3"                  PT Education - 03/10/20 0920    Education Details HEP update - quadruped fire hydrants, side plank clam    Person(s) Educated Patient    Methods Explanation;Demonstration;Handout;Verbal cues    Comprehension Verbalized understanding;Verbal cues required;Returned demonstration            PT Short Term Goals - 01/01/20 1111      PT SHORT TERM GOAL #1   Title Patient will be independent with initial HEP    Status Achieved   12/15/19     PT SHORT TERM GOAL #2   Title Patient to report pain reduction in frequency and intensity by >/= 25%    Status Achieved   7/19 - pt reporting 20-25% reduction in pain with walking            PT Long Term Goals - 03/10/20 0853       PT LONG TERM GOAL #1   Title Patient will be independent with ongoing/advanced HEP    Status Achieved   03/10/20     PT LONG TERM GOAL #2   Title Patient to report pain reduction in frequency and intensity by >/= 75%    Baseline Prior goal: 50% improvement - met as of 01/13/20 (60% reduction in frequency of L hip pain & 50% improvement in intensity of pain)    Status Achieved   03/10/20 - 90% reduction in pain with walking and mobility but still sensitive to touch     PT LONG TERM GOAL #3   Title Patient will demonstrate improved L hip strength to >/= 4+/5 for improved stability and ease of mobility    Status Achieved   03/10/20 - met with exception of L hip ER strength 4/5     PT LONG TERM GOAL #4   Title Patient will improve walking tolerance to >/= 30 minutes w/o pain interference to allow her to walk across campus or take her dog for a walk    Status Achieved   02/18/20     PT LONG TERM GOAL #5   Title Patient will negotiate stairs reciprocally with normal step pattern w/o limitation due to L hip pain or weakness    Status Achieved   03/10/20                Plan - 03/10/20 0852    Clinical Impression Statement Shemaiah reports 90% improvement in her L hip pain since start of PT, allowing her to resume most of her daily activities including walking her dog, walking across [place] and climbing stairs w/o limitation due her L hip. She still notes some ttp but even this is much less than it had previously been. She has demonstrated significant gains in her B hip and knee strength - overall strength now 5/5 with only exceptions of B hip ER (4+/5 on R, 4/5 on L). Chaunice feels confident with ongoing HEP to address remaining strength deficits and promote increased activity tolerance. All goals now essentially met and pt  ready to transition to HEP but would like to remain on 30-day hold for PT in the event that issues arise as she continues to progress HEP.    Comorbidities L THA 08/22/18  secondary to AVN, R hip AVN, anxiety, depression    Rehab Potential Good    PT Treatment/Interventions ADLs/Self Care Home Management;Cryotherapy;Electrical Stimulation;Iontophoresis 49m/ml Dexamethasone;Moist Heat;Ultrasound;Gait training;Stair training;Functional mobility training;Therapeutic activities;Therapeutic exercise;Balance training;Neuromuscular re-education;Patient/family education;Manual techniques;Scar mobilization;Passive range of motion;Dry needling;Taping    PT Next Visit Plan transition to HEP with 30-day hold    PT Home Exercise Plan 7/1 - HS, ITB, glute/piriformis stretches; 7/8 - s/l clam, hip ABD/ext, hip flex/ext, front/back tap, CW/CCW circles, supine red TB clam, bridge + ABD isometric; 8/9 - red TB (progressed to green TB 9/14) standing 4-way SLR; 8/27 - lunges, red TB lateral & fwd/back monster walk, fwd & lateral step-downs; 8/30 - seated hip flexor/quad stretch; 9/20 - sidelying QL stretch; 9/30 - quadruped hip ABD/ER (fire hydrant) and side plank clam    Consulted and Agree with Plan of Care Patient           Patient will benefit from skilled therapeutic intervention in order to improve the following deficits and impairments:  Abnormal gait, Decreased activity tolerance, Decreased endurance, Decreased mobility, Decreased range of motion, Decreased scar mobility, Decreased strength, Difficulty walking, Increased fascial restricitons, Increased muscle spasms, Impaired flexibility, Improper body mechanics, Pain  Visit Diagnosis: Pain in left hip  Difficulty in walking, not elsewhere classified  Other symptoms and signs involving the musculoskeletal system  Other muscle spasm  Muscle weakness (generalized)     Problem List Patient Active Problem List   Diagnosis Date Noted  . Avascular necrosis of hip, left ([facility] 08/22/2018  . Av[facility] of femoral head, left ([facility] 08/22/2018    JPercival Spanish PT, MPT 03/10/2020, 9:56 AM  [facility] [place] [address]Phone: [phone]  Fax:  [phone] Name: SASUKA DUSSEAUMRN: [id number] of Birth: [birth year]  PHYSICAL THERAPY DISCHARGE SUMMARY  Visits from Start of Care: 25  Current functional level related to goals / functional outcomes:   Refer to above clinical impression for status as of last visit on 03/10/2020. Patient was placed on hold for 30 days and has not needed to return to PT, therefore will proceed with discharge from PT for this episode.   Remaining deficits:   As above.    Education / Equipment:   HEP  Plan: Patient agrees to discharge.  Patient goals were met. Patient is being discharged due to meeting the stated rehab goals.  ?????     JPercival Spanish PT, MPT 05/13/20, 8:19 AM  [facility] [place] [address]Phone: [phone]  Fax:  [phone]

## 2020-03-10 NOTE — Patient Instructions (Signed)
    Home exercise program created by Akshaya Toepfer, PT.  For questions, please contact Sharisa Toves via phone at [phone] or email at [email or web address]  [facility] [place] [address]  [address] [place], [place], [address] Phone: [phone]   Fax:  [phone]    

## 2020-05-17 ENCOUNTER — Ambulatory Visit: Payer: PRIVATE HEALTH INSURANCE | Admitting: Orthopaedic Surgery

## 2020-05-18 ENCOUNTER — Encounter: Payer: Self-pay | Admitting: Orthopaedic Surgery

## 2020-05-18 ENCOUNTER — Ambulatory Visit (INDEPENDENT_AMBULATORY_CARE_PROVIDER_SITE_OTHER): Payer: PRIVATE HEALTH INSURANCE | Admitting: Orthopaedic Surgery

## 2020-05-18 DIAGNOSIS — M7062 Trochanteric bursitis, left hip: Secondary | ICD-10-CM | POA: Diagnosis not present

## 2020-05-18 DIAGNOSIS — Z96642 Presence of left artificial hip joint: Secondary | ICD-10-CM

## 2020-05-18 MED ORDER — LIDOCAINE HCL 1 % IJ SOLN
3.0000 mL | INTRAMUSCULAR | Status: AC | PRN
  Administered 2020-05-18: 3 mL

## 2020-05-18 MED ORDER — METHYLPREDNISOLONE ACETATE 40 MG/ML IJ SUSP
40.0000 mg | INTRAMUSCULAR | Status: AC | PRN
  Administered 2020-05-18: 40 mg via INTRA_ARTICULAR

## 2020-05-18 NOTE — Progress Notes (Signed)
Office Visit Note   Patient: Patricia Mclean           Date of Birth: [birth year]           MRN: [id number] Visit Date: 05/18/2020              Requested by: Mosetta Putt, MD [address] [place],  [place] [address] PCP: Mosetta Putt, MD   Assessment & Plan: Visit Diagnoses:  1. Trochanteric bursitis, left hip   2. History of total left hip replacement     Plan: Per the patient's request I did provide another steroid injection of her left hip trochanteric area.  At this point, follow-up can be as needed.  I can always inject this again in the spring if she needs it.  All questions and concerns were answered and addressed.  Follow-Up Instructions: Return if symptoms worsen or fail to improve.   Orders:  Orders Placed This Encounter  Procedures  . Large Joint Inj   No orders of the defined types were placed in this encounter.     Procedures: Large Joint Inj: L greater trochanter on 05/18/2020 8:45 AM Indications: pain and diagnostic evaluation Details: 22 G 1.5 in needle, lateral approach  Arthrogram: No  Medications: 3 mL lidocaine 1 %; 40 mg methylPREDNISolone acetate 40 MG/ML Outcome: tolerated well, no immediate complications Procedure, treatment alternatives, risks and benefits explained, specific risks discussed. Consent was given by the patient. Immediately prior to procedure a time out was called to verify the correct patient, procedure, equipment, support staff and site/side marked as required. Patient was prepped and draped in the usual sterile fashion.       Clinical Data: No additional findings.   Subjective: Chief Complaint  Patient presents with  . Left Hip - Follow-up  The patient comes in today for continued follow-up as a relates to left hip trochanteric bursitis.  She had a anterior hip replacement by one of my colleagues in town in the past.  At this point, she is making great progress with physical therapy.  She is requesting 1  more steroid injection today.  Is been quite sometime since I did place an injection over her hip.  She has been very satisfied thus far and the therapy is helped her significantly.  She looks great and she is even lost weight.  HPI  Review of Systems There is currently listed no headache, chest pain, shortness of breath, fever, chills, nausea, vomiting  Objective: Vital Signs: There were no vitals taken for this visit.  Physical Exam She is alert and orient x3 and in no acute distress.  She does not walk with a limp. Ortho Exam Examination of her left hip shows some sensitivity of the trochanteric area but it is much better than it has been before. Specialty Comments:  No specialty comments available.  Imaging: No results found.   PMFS History: Patient Active Problem List   Diagnosis Date Noted  . Avascular necrosis of hip, left (HCC) 08/22/2018  . Avascular necrosis of femoral head, left (HCC) 08/22/2018   Past Medical History:  Diagnosis Date  . Anxiety   . GERD (gastroesophageal reflux disease)   . Headache    metoprolol and topamx  . Pneumonia    x3 had vaccine    History reviewed. No pertinent family history.  Past Surgical History:  Procedure Laterality Date  . TONSILLECTOMY    . TOTAL HIP ARTHROPLASTY Left 08/22/2018   Procedure: TOTAL HIP ARTHROPLASTY ANTERIOR APPROACH;  Surgeon: Jodi Geralds, MD;  Location: [facility];  Service: Orthopedics;  Laterality: Left;   Social History   Occupational History  . Not on file  Tobacco Use  . Smoking status: Never Smoker  . Smokeless tobacco: Never Used  Vaping Use  . Vaping Use: Never used  Substance and Sexual Activity  . Alcohol use: Yes    Alcohol/week: 1.0 standard drink    Types: 1 Glasses of wine per week    Comment: occasional  wine with dinner  . Drug use: Never  . Sexual activity: Yes

## 2020-09-19 ENCOUNTER — Ambulatory Visit (INDEPENDENT_AMBULATORY_CARE_PROVIDER_SITE_OTHER): Payer: BC Managed Care – PPO | Admitting: Psychiatry

## 2020-09-19 DIAGNOSIS — F411 Generalized anxiety disorder: Secondary | ICD-10-CM

## 2020-09-19 NOTE — Progress Notes (Addendum)
PROBLEM-FOCUSED INITIAL PSYCHOTHERAPY EVALUATION Marliss Czar, PhD LP [facility]  Name: Patricia Mclean Date: 09/19/2020 Time spent: 60 min MRN: [id number] DOB: 04-12-1971 Guardian/Payee: self  PCP: Mosetta Putt, MD Documentation requested on this visit: No  PROBLEM HISTORY Reason for Visit /Presenting Problem:  Chief Complaint  Patient presents with   Establish Care   Anxiety    Narrative/History of Present Illness 50yo MWF referred by Mosetta Putt, MD for treatment of stress and anxiety.  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  Also notes longstanding anxiety problem, ramped up the last 4 years.    At 13yo, mother died after a long, horrific battle with cancer.  Always sick, as long as she can recall.  Old anger at father for telling her everything would be OK when he knew better.  In 2018 there were 4 family deaths, including mother in law, who was surprised with terminal, stage IV, stomach cancer.  2019 husband [name] (from [place]) had 6 eye surgeries to rescue retinal tears.  2020 she had a hip replacement -- the last operation before COVID lockdown, had Patricia Mclean by video, which prolonged and complicated her recovery.  2022 developed avascular necrosis, setting up for another surgery on the right.  Meanwhile, 84yo father developed rapid onset dementia.  Worries about H, who feels he is well enough for international travel, but concerns if he would develop urgent eye problems while traveling.    Reports panic attacks, associated with crowds, medical issues (worry, catastrophizing, convinced bad news will happen), and mind can race while worrying about multiple issues, including the welfare of their dogs or house.  C/o insomnia, has Xanax QHS and PRN  for circumstantial anxiety attacks.  C/o muscle tension, especially her jaw.  Concierge medicine  PCP thought she had OCD, perhaps noting her precision.  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  Dealt with increasing pushback and noncompliance from the university president.  Overall, anxiety symptoms became paralyzing enough to see the need to cut her stress and retire.    Prior Psychiatric Assessment/Treatment:   Outpatient treatment: PCP only noted Psychiatric hospitalization: none stated Psychological assessment/testing: none stated   Abuse/neglect screening: Victim of abuse: Not assessed at this time / none suspected.   Victim of neglect: Not assessed at this time / none suspected.   Perpetrator of abuse/neglect: Not assessed at this time / none suspected.   Witness / Exposure to Domestic Violence: Not assessed at this time / none suspected.   Witness to Community Violence:  Not assessed at this time / none suspected.   Protective Services Involvement: No.   Report needed: No.    Substance abuse screening: Current substance abuse: Not assessed at this time / none suspected.   History of impactful substance use/abuse: Not assessed at this time / none suspected.     FAMILY/SOCIAL HISTORY Family of origin --  Family of intention/current living situation -- with spouse, stable circumstances, no c/o.  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  Of note for psychotherapy at this time ongoing recovery from orthopedic issues and pain.. Past Medical History:  Diagnosis Date   Anxiety    GERD (gastroesophageal reflux disease)    Headache    metoprolol and topamx   Pneumonia    x3 had vaccine     Past Surgical History:  Procedure Laterality Date   TONSILLECTOMY     TOTAL HIP ARTHROPLASTY Left 08/22/2018   Procedure: TOTAL HIP ARTHROPLASTY ANTERIOR APPROACH;  Surgeon: Jodi Geralds, MD;  Location: [facility];  Service: Orthopedics;  Laterality: Left;    Allergies  Allergen Reactions   Penicillins Anaphylaxis and Shortness Of Breath    Did it involve swelling of the face/tongue/throat, SOB, or low BP? Yes Did it involve sudden or severe rash/hives, skin peeling, or any reaction on the inside of your mouth or nose? No Did you need to seek medical attention at a hospital or doctor's office? Yes When did it last happen?      30+ years If all above answers are "NO", may proceed with cephalosporin use.    Avelox [Moxifloxacin Hcl] Hives   Levofloxacin Hives    Medications (self-reported): For gastrointestinal issues, Rx Dr. Alycia Rossetti: Famotidine 40mg  BID Sucralfate 1 G BID Omeprazole 40mg  BID Dicyclomine Hydrochloride 10mg  BID Citrucel 14 x 0.5mg  caps through the day For other issues, RX Dr. : Citalopram 20mg  QD Metoprolol 50mg  BID Trokendi (topiramate) 100mg  for anxiety and headache prophylaxis Chlorthalidone 25mg  1/2 pill QD Potassium chloride 20 meq TID Rosuvastatin calcium 10mg  QD Allegra 180mg  PRN meds: Ondansetron 4mg  1 - 2 Eletriptan 40mg  1/2 - 2  Xanax 0.25mg  1/2 - 1  Supplements: Women's One-a-Day MVI B12  Medications (as listed in Epic): Current Outpatient Medications  Medication Sig Dispense Refill   acetaminophen (TYLENOL) 500 MG tablet Take 1,000 mg by mouth every 6 (six) hours as needed for moderate  pain or headache.     albuterol (VENTOLIN HFA) 108 (90 Base) MCG/ACT inhaler SMARTSIG:2 Puff(s) By Mouth Every 4 Hours PRN     ALPRAZolam (XANAX) 0.25 MG tablet Take 0.25 mg by mouth at bedtime as needed for anxiety or sleep.     chlorhexidine (PERIDEX) 0.12 % solution SMARTSIG:0.5 Capful(s) By Mouth Every Night     chlorpheniramine-HYDROcodone (TUSSIONEX) 10-8 MG/5ML SUER SMARTSIG:5 Milliliter(s) By Mouth Every 12 Hours PRN     chlorthalidone (HYGROTON) 25 MG tablet Take 12.5 mg by mouth every morning.     citalopram (CELEXA) 20 MG tablet Take 20 mg by mouth every morning.     clindamycin (CLEOCIN) 150 MG capsule Take 150 mg by mouth every 6 (six) hours.     conjugated estrogens (PREMARIN) vaginal cream Place vaginally.     DEXILANT 60 MG capsule Take 1 capsule by mouth daily.     dicyclomine (BENTYL) 10 MG capsule Take by mouth.     dicyclomine (BENTYL) 10 MG capsule Take 10 mg by mouth every 6 (six) hours as needed.     doxycycline (VIBRAMYCIN) 100 MG capsule Take 100 mg by mouth 2 (two) times daily.     eletriptan (RELPAX) 40 MG tablet Take by mouth.     famotidine (PEPCID) 40 MG tablet Take by mouth.     gabapentin (NEURONTIN) 600 MG tablet Take 300-600 mg by mouth  at bedtime as needed.     ibuprofen (ADVIL) 800 MG tablet Take by mouth.     methylPREDNISolone (MEDROL) 4 MG tablet Medrol dose pack. Take as instructed 21 tablet 0   metoprolol tartrate (LOPRESSOR) 50 MG tablet Take 25 mg by mouth 2 (two) times daily.     Multiple Vitamin (MULTIVITAMIN WITH MINERALS) TABS tablet Take 1 tablet by mouth daily.     omeprazole (PRILOSEC) 40 MG capsule Take 40 mg by mouth daily.     ondansetron (ZOFRAN-ODT) 8 MG disintegrating tablet Take by mouth.     PARoxetine (PAXIL) 20 MG tablet Take 20 mg by mouth daily.     Potassium Chloride ER 20 MEQ TBCR Take 1 tablet by mouth 3 (three) times daily.     RABEprazole (ACIPHEX) 20 MG tablet Take 20 mg by mouth 2 (two) times daily.     SYMBICORT 80-4.5  MCG/ACT inhaler Inhale into the lungs.     topiramate (TOPAMAX) 50 MG tablet Take 50 mg by mouth 2 (two) times daily.     No current facility-administered medications for this visit.    MENTAL STATUS AND OBSERVATIONS Appearance:   Casual and Well Groomed     Behavior:  Appropriate  Motor:  Normal  Speech/Language:   Clear and Coherent  Affect:  Appropriate  Mood:  anxious  Thought process:  normal  Thought content:    WNL  Sensory/Perceptual disturbances:    WNL  Orientation:  Fully oriented  Attention:  Good  Concentration:  Good  Memory:  WNL  Fund of knowledge:   Good  Insight:    Good  Judgment:   Good  Impulse Control:  Good   Initial Risk Assessment: Danger to self: No Self-injurious behavior: No Danger to others: No Physical aggression / violence: No Duty to warn: No Access to firearms a concern: No Gang involvement: No Patient / guardian was educated about steps to take if suicide or homicide risk level increases between visits: yes While future psychiatric events cannot be accurately predicted, the patient does not currently require acute inpatient psychiatric care and does not currently meet [facility] involuntary [facility] criteria.   DIAGNOSIS:    ICD-10-CM   1. Generalized anxiety disorder  F41.1     INITIAL TREATMENT: Support/validation provided for distressing symptoms and confirmed rapport Ethical orientation and informed consent confirmed re: privacy rights -- including but not limited to HIPAA, EMR and use of e-PHI patient responsibilities -- scheduling, fair notice of changes, in-person vs. telehealth and regulatory and financial conditions affecting choice expectations for working relationship in psychotherapy needs and consents for working partnerships and exchange of information with other health care providers, especially any medication and other behavioral health providers Initial orientation to cognitive-behavioral and solution-focused  therapy approach Psychoeducation and initial recommendations: Interpreted range of traumatic experiences culturing anxiety, teaching the threat-detecting brain to look broadly for trouble, have more difficulty settling worry, and make catastrophic outcomes seem entirely more likely than they are Oriented to CBT, particularly the need to develop a self-soothing response and a habit of predicting and testing feared outcomes. Oriented to workbook chapter packaging panic control therapy Outlook for therapy -- scheduling constraints, availability of crisis service, inclusion of family member(s) as appropriate  Plan: Initial homework to read panic control chapter and make use of the analysis of panic attacks, see if the breath control procedure seems doable Will further that chapter next visit Maintain medication as prescribed and work faithfully with relevant prescriber(s) if any  changes are desired or seem indicated Call the clinic on-call service, present to ER, or call 911 if any life-threatening psychiatric crisis Return in about 2 weeks (around 10/03/2020) for time as available, recommend scheduling ahead.  Robley Fries, PhD  Marliss Czar, PhD LP Clinical Psychologist, [facility] [address], [address] [place], [place] [address] [phone]

## 2020-10-11 ENCOUNTER — Ambulatory Visit (INDEPENDENT_AMBULATORY_CARE_PROVIDER_SITE_OTHER): Payer: BC Managed Care – PPO | Admitting: Psychiatry

## 2020-10-11 ENCOUNTER — Other Ambulatory Visit: Payer: Self-pay

## 2020-10-11 DIAGNOSIS — F411 Generalized anxiety disorder: Secondary | ICD-10-CM | POA: Diagnosis not present

## 2020-10-11 NOTE — Progress Notes (Signed)
Psychotherapy Progress Note [facility] Marliss Czar, PhD LP  Patient ID: Patricia Mclean     MRN: [id number] Therapy format: Individual psychotherapy Date: 10/11/2020      Start: 8:12a     Stop: 9:02a     Time Spent: 50 min Location: In-person   Session narrative (presenting needs, interim history, self-report of stressors and symptoms, applications of prior therapy, status changes, and interventions made in session) Helpful to read the panic control workbook chapter, learn about catastrophizing and overestimating.  Helped with coping with intimidating medical procedures recently, est 7/10 anxiety, vs. 10/10 what would have been without the exposure and perspective.  Typically thinks of mother's scenario -- died younger than she is now, went through horrible procedures herself.  Coped with self-reminders about having better medical care, happened to mom doesn't mean it will happen to me.  Has practiced breath-counting a couple times a day, and applied while in procedures, to some benefit.  Affirmed and encouraged.  Turned attention to cognitive restructuring form and upcoming family event (family graduation) in which she will have to manage father with dementia.  Anticipates father balking at wheelchair (proud [employer] hx).  Reviewed possibilities coping with that, recruiting father with analogies to his [employer] career and principles he would know of command and capacity.  Introduced an observation game as well for objectifying father's anticipated inappropriate comments and challenges, to lighten the mood and better roll with irrationality rather than agonize.  Therapeutic modalities: Cognitive Behavioral Therapy and Solution-Oriented/Positive Psychology  Mental Status/Observations:  Appearance:   Casual     Behavior:  Appropriate  Motor:  Normal  Speech/Language:   Clear and Coherent  Affect:  Appropriate  Mood:  Less anxious  Thought process:  normal  Thought  content:    WNL  Sensory/Perceptual disturbances:    WNL  Orientation:  Fully oriented  Attention:  Good    Concentration:  Good  Memory:  WNL  Insight:    Good  Judgment:   Good  Impulse Control:  Good   Risk Assessment: Danger to Self: No Self-injurious Behavior: No Danger to Others: No Physical Aggression / Violence: No Duty to Warn: No Access to Firearms a concern: No  Assessment of progress:  progressing  Diagnosis:   ICD-10-CM   1. Generalized anxiety disorder wth panic attacks  F41.1    Plan:  Continue practice with breath-counting to calm anxiety and the kind of self-talk used recently Work into Chart[employer] section of workbook chapter Use tips to handle father's challenging behaviors Other recommendations/advice as may be noted above Continue to utilize previously learned skills ad lib Maintain medication as prescribed and work faithfully with relevant prescriber(s) if any changes are desired or seem indicated Call the clinic on-call service, present to ER, or call 911 if any life-threatening psychiatric crisis Return in about 2 weeks (around 10/25/2020) for session(s) already scheduled. Already scheduled visit in this office 10/25/2020.  Robley Fries, PhD Marliss Czar, PhD LP Clinical Psychologist, [facility] [address] [place], [place] [address] [phone]

## 2020-10-25 ENCOUNTER — Other Ambulatory Visit: Payer: Self-pay

## 2020-10-25 ENCOUNTER — Ambulatory Visit (INDEPENDENT_AMBULATORY_CARE_PROVIDER_SITE_OTHER): Payer: BC Managed Care – PPO | Admitting: Psychiatry

## 2020-10-25 DIAGNOSIS — F411 Generalized anxiety disorder: Secondary | ICD-10-CM | POA: Diagnosis not present

## 2020-10-25 DIAGNOSIS — Z636 Dependent relative needing care at home: Secondary | ICD-10-CM

## 2020-10-25 NOTE — Progress Notes (Signed)
Psychotherapy Progress Note [facility] Marliss Czar, PhD LP  Patient ID: Patricia Mclean     MRN: [id number] Therapy format: Individual psychotherapy Date: 10/25/2020      Start: 11:16a     Stop: 12:03p     Time Spent: 47 min Location: In-person   Session narrative (presenting needs, interim history, self-report of stressors and symptoms, applications of prior therapy, status changes, and interventions made in session) "Survived" graduation trip, wrangling her stubborn father.  Notice how she can lose her calm when her usual "rocks" (brother Theron Arista, here) start losing their peace.  Was able to take brief emotional break, albeit with some feeling of guilt leaving Theron Arista with the scene.  Other concerns and galling experiences with father telling dramatic -- and untrue -- stories of idiotic or blameworthy things her mom did.  Interpreted his motivations and credited her helping her nieces detoxify the experience.  10-week old puppy now, in the midst of housetraining. Going well.  Affirmed and encouraged.  Wants to take on her own resistance to travel, cultured by the 4 deaths and several surgeries, including [name]'s retinal tears.  Dreams repeatedly of what if they had gotten on the plane with his retinal tear, concerns about foreign health care and what it would not be able to do.  Now 4 years avoiding travel, for the most part to forestall anxiety about being beyond help, but of course some time dominated by the pandemic.  Addressed the goal not to suppress worry, per se, but to fulfill it by imagining what could be done for conceivable needs and challenging possible assumptions about the state of health care in likely destinations.  Therapeutic modalities: Cognitive Behavioral Therapy and Solution-Oriented/Positive Psychology  Mental Status/Observations:  Appearance:   Casual     Behavior:  Appropriate  Motor:  Normal  Speech/Language:   Clear and Coherent  Affect:   Appropriate  Mood:  normal, anxiety re situations  Thought process:  normal  Thought content:    WNL  Sensory/Perceptual disturbances:    WNL  Orientation:  Fully oriented  Attention:  Good    Concentration:  Good  Memory:  WNL  Insight:    Good  Judgment:   Good  Impulse Control:  Good   Risk Assessment: Danger to Self: No Self-injurious Behavior: No Danger to Others: No Physical Aggression / Violence: No Duty to Warn: No Access to Firearms a concern: No  Assessment of progress:  progressing  Diagnosis:   ICD-10-CM   1. Generalized anxiety disorder wth panic attacks  F41.1     2. Caregiver stress  Z63.6      Plan:  Continue panic control skills practice Investigate as able health care capacities in destinations of interest  Other recommendations/advice as may be noted above Continue to utilize previously learned skills ad lib Maintain medication as prescribed and work faithfully with relevant prescriber(s) if any changes are desired or seem indicated Call the clinic on-call service, present to ER, or call 911 if any life-threatening psychiatric crisis Return in about 2 weeks (around 11/08/2020) for session(s) already scheduled. Already scheduled visit in this office 11/08/2020.  Robley Fries, PhD Marliss Czar, PhD LP Clinical Psychologist, [facility] [address], [address] [place], [place] [address] [phone]

## 2020-11-08 ENCOUNTER — Other Ambulatory Visit: Payer: Self-pay

## 2020-11-08 ENCOUNTER — Ambulatory Visit (INDEPENDENT_AMBULATORY_CARE_PROVIDER_SITE_OTHER): Payer: BC Managed Care – PPO | Admitting: Psychiatry

## 2020-11-08 DIAGNOSIS — Z636 Dependent relative needing care at home: Secondary | ICD-10-CM

## 2020-11-08 DIAGNOSIS — F411 Generalized anxiety disorder: Secondary | ICD-10-CM | POA: Diagnosis not present

## 2020-11-08 DIAGNOSIS — F4321 Adjustment disorder with depressed mood: Secondary | ICD-10-CM

## 2020-11-08 NOTE — Progress Notes (Signed)
Psychotherapy Progress Note [facility] Marliss Czar, PhD LP  Patient ID: Patricia Mclean     MRN: [id number] Therapy format: Individual psychotherapy Date: 11/08/2020      Start: 11:28a     Stop: 12:17p     Time Spent: 49 min Location: In-person   Session narrative (presenting needs, interim history, self-report of stressors and symptoms, applications of prior therapy, status changes, and interventions made in session) Q about medication, wonders if she should get specialty review of her medication, given persistent lack of motivation, aversion to going out, not feeling like having people over, feeling like the best is over in life.  Reviewed current meds -- ongoing high-strength acid controller, 20mg  Celexa/D, topiramate 100mg /D.  Probably worth a medication review with psychiatry, offered to refer.   Aware that the recent upswell in attention to mental health (in the context of major shooting events) is taking her back through memories of being shipped off to family she didn't know in the aftermath of mother's death, being told by father not to cry beforehand, and then he went for inpatient mental health ([facility], 6 months' worth) when she was 25.  It was positive staying with them, aunt became as 2nd mother, and cousins are as sisters.  While inpatient, father got engaged to a woman who supplied in his office, considered a con [facility] her 7th husband).  When [place] moved back, to their home, they married 2 weeks later and stepmother tried to send Kianna off to boarding school while father heavily medicated.  Father got off heavy meds, woke up to the situation, sent sM packing, at great loss to his reputation and business.  Support/empathy provided.   Discussed activities to raise her energy and morale as she deals with these issues, severance from [employer], and the array of worries and losses she deals with.  Advised physical activity as tolerated,  with interests in walking and interacting more with puppies.  Therapeutic modalities: Cognitive Behavioral Therapy, Solution-Oriented/Positive Psychology, and Ego-Supportive  Mental Status/Observations:  Appearance:   Casual     Behavior:  Appropriate  Motor:  Normal  Speech/Language:   Clear and Coherent  Affect:  Appropriate  Mood:  anxious and dysthymic  Thought process:  normal  Thought content:    WNL  Sensory/Perceptual disturbances:    WNL  Orientation:  Fully oriented  Attention:  Good    Concentration:  Good  Memory:  WNL  Insight:    Good  Judgment:   Good  Impulse Control:  Good   Risk Assessment: Danger to Self: No Self-injurious Behavior: No Danger to Others: No Physical Aggression / Violence: No Duty to Warn: No Access to Firearms a concern: No  Assessment of progress:  progressing  Diagnosis:   ICD-10-CM   1. Generalized anxiety disorder wth panic attacks  F41.1     2. Adjustment disorder with depressed mood  F43.21     3. Caregiver stress  Z63.6      Plan:  Resolved today to get a walk with puppies and [name] for energy and morale Option to write up how she wishes things had been handled when M died, for catharsis and self acknowledgment Refer to psychiatric colleague for expert opinion on medication.  Patient to inform PCP of her decision to seek specialist advice. Other recommendations/advice as may be noted above Continue to utilize previously learned skills ad lib Maintain medication as prescribed and work faithfully with relevant prescriber(s) if any  changes are desired or seem indicated Call the clinic on-call service, present to ER, or call 911 if any life-threatening psychiatric crisis Return in about 2 weeks (around 11/22/2020). Already scheduled visit in this office 11/23/2020.  Robley Fries, PhD Marliss Czar, PhD LP Clinical Psychologist, [facility] [address] [place], [place] [address] [phone]

## 2020-11-23 ENCOUNTER — Ambulatory Visit (INDEPENDENT_AMBULATORY_CARE_PROVIDER_SITE_OTHER): Payer: BC Managed Care – PPO | Admitting: Psychiatry

## 2020-11-23 ENCOUNTER — Other Ambulatory Visit: Payer: Self-pay

## 2020-11-23 DIAGNOSIS — Z638 Other specified problems related to primary support group: Secondary | ICD-10-CM | POA: Diagnosis not present

## 2020-11-23 DIAGNOSIS — F4321 Adjustment disorder with depressed mood: Secondary | ICD-10-CM

## 2020-11-23 DIAGNOSIS — F411 Generalized anxiety disorder: Secondary | ICD-10-CM | POA: Diagnosis not present

## 2020-11-23 NOTE — Progress Notes (Signed)
Psychotherapy Progress Note [facility] Marliss Czar, PhD LP  Patient ID: Patricia Mclean     MRN: [id number] Therapy format: Individual psychotherapy Date: 11/23/2020      Start: 1:04p     Stop: 1:51p     Time Spent: 47 min Location: In-person   Session narrative (presenting needs, interim history, self-report of stressors and symptoms, applications of prior therapy, status changes, and interventions made in session) Enjoyed the walk with the puppies last time, has gone a couple times to a favorite nature trail [facility] that was also a sentimental attachment (former favorite with old dog, Prewitt, with whom they went to the mat for her health and eventually had to part).  Happened in the course of 4 deaths packed together.  The outing, the exercise, the normalcy, the more like routine, and the companionship are all helping morale.    Since telling stories last time, realizes important to note she is the youngest of 4 by 8 years, and two sibs (1st and 3rd, John and Lance Morin) have separated from the whole family and don't get along with each other, really.  Doesn't really know why, but it helped set up why she never challenged father.  Was always the don't-make-trouble, high achieving child to help father's equilibrium and avoid being estranged herself.  Father redoing his will recently to cut out the two estranged, and the language required is harsh.  Has surfaced some intense relationships and tensions.  When [place] family have invited both the estranged and the intact to funerals and weddings, it's meant having to have security present.  Hx of meeting with the estranged before her own wedding to secure agreement that they could prevent a scene; in response, they declined and went off on her in public.  Hx also of resentful, possibly psychotic actions like sending snake heads, smashed family items, and torn pictures in the mail to father and her close  brother, hangup calls, and conspicuously urinating on mother's grave.  Mostly relieved to be out of touch with the 2 of them at present, and does not foresee any pressure coming to deal with their behavior, although it is always possible if they see some provocation.  Briefly discussed prospect of med management, with first appointment coming shortly, and most likely recommendations and considerations for med changes that might be made.  Therapeutic modalities: Cognitive Behavioral Therapy, Solution-Oriented/Positive Psychology, and Ego-Supportive  Mental Status/Observations:  Appearance:   Casual     Behavior:  Appropriate  Motor:  Normal  Speech/Language:   Clear and Coherent  Affect:  Appropriate  Mood:  anxious and dysthymic, improving  Thought process:  normal  Thought content:    WNL  Sensory/Perceptual disturbances:    WNL  Orientation:  Fully oriented  Attention:  Good    Concentration:  Good  Memory:  WNL  Insight:    Good  Judgment:   Good  Impulse Control:  Good   Risk Assessment: Danger to Self: No Self-injurious Behavior: No Danger to Others: No Physical Aggression / Violence: No Duty to Warn: No Access to Firearms a concern: No  Assessment of progress:  progressing  Diagnosis:   ICD-10-CM   1. Generalized anxiety disorder wth panic attacks  F41.1     2. Adjustment disorder with depressed mood  F43.21     3. Relationship problem with family members  Z63.8      Plan:  Continue walking, puppies, and outings as tolerated for mood  and morale Self-affirm handling of her unruly siblings and ability to cope if they stage another uprising at some family function or otherwise Continue use of stress management and anxiety coping techniques from earlier Other recommendations/advice as may be noted above Continue to utilize previously learned skills ad lib Maintain medication as prescribed and work faithfully with relevant prescriber(s) if any changes are desired or  seem indicated Call the clinic on-call service, present to ER, or call 911 if any life-threatening psychiatric crisis Return for session(s) already scheduled. Already scheduled visit in this office 12/06/2020.  Robley Fries, PhD Marliss Czar, PhD LP Clinical Psychologist, [facility] [address] [place], [place] [address] [phone]

## 2020-12-06 ENCOUNTER — Ambulatory Visit (INDEPENDENT_AMBULATORY_CARE_PROVIDER_SITE_OTHER): Payer: BC Managed Care – PPO | Admitting: Psychiatry

## 2020-12-06 ENCOUNTER — Other Ambulatory Visit: Payer: Self-pay

## 2020-12-06 DIAGNOSIS — F4321 Adjustment disorder with depressed mood: Secondary | ICD-10-CM

## 2020-12-06 DIAGNOSIS — F411 Generalized anxiety disorder: Secondary | ICD-10-CM | POA: Diagnosis not present

## 2020-12-06 NOTE — Progress Notes (Signed)
Psychotherapy Progress Note [facility] Marliss Czar, PhD LP  Patient ID: Patricia Mclean     MRN: [id number] Therapy format: Individual psychotherapy Date: 12/06/2020      Start: 2:25p     Stop: 3:12p     Time Spent: 47 min Location: In-person   Session narrative (presenting needs, interim history, self-report of stressors and symptoms, applications of prior therapy, status changes, and interventions made in session) Feeling substantially better walking three/wk. encouraged to continue.  No panic attacks to report nor noticeable difficulty sleeping.  Some apprehension about going to a dinner this evening with former colleagues from [employer], and how such gatherings tend to turn into bitch sessions about [employer] and dysfunctional management.  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  Her own main reasons for leaving had to do with witnessing corruption, being asked to do unethical things, having to hear a lot of locker room boy talk, racist, and abrogating contracts with faculty, demanding overtime, etc. clear at this point that she has said enough about her own reasons with her former colleagues and would rather not soak on the subject when she sees them.  Affirmed her right to ask friends to change direction and discussed ways to mitigate the threat of being saturated with gossip.  Agreed she can ask the group, or select individuals if she feels more comfortable about it, to help turn the conversation if it ranges too long on negatives from the workplace.  Also agreed she may excuse herself to the bathroom or take an early exit from the gathering if it seems intractable, but that it is particularly worth trying out her assertiveness first.  Also prepared a couple of wise cracks, should she need them, to playfully nudge the conversation along.  Therapeutic modalities: Cognitive Behavioral Therapy, Solution-Oriented/Positive Psychology, and  Assertiveness/Communication  Mental Status/Observations:  Appearance:   Casual     Behavior:  Appropriate  Motor:  Normal  Speech/Language:   Clear and Coherent  Affect:  Appropriate  Mood:  normal  Thought process:  normal  Thought content:    WNL  Sensory/Perceptual disturbances:    WNL  Orientation:  Fully oriented  Attention:  Good    Concentration:  Good  Memory:  WNL  Insight:    Good  Judgment:   Good  Impulse Control:  Good   Risk Assessment: Danger to Self: No Self-injurious Behavior: No Danger to Others: No Physical Aggression / Violence: No Duty to Warn: No Access to Firearms a concern: No  Assessment of progress:  progressing  Diagnosis:   ICD-10-CM   1. Generalized anxiety disorder wth panic attacks  F41.1     2. Adjustment disorder with depressed mood  F43.21      Plan:  Continue walking, movement, and outings for mood and morale Use communication tips and boundary setting tips for tonight's gathering Continue stress management, sleep management, and anti panic measures Other recommendations/advice as may be noted above Continue to utilize previously learned skills ad lib Maintain medication as prescribed and work faithfully with relevant prescriber(s) if any changes are desired or seem indicated Call the clinic on-call service, present to ER, or call 911 if any life-threatening psychiatric crisis Return for session(s) already scheduled. Already scheduled visit in this office 12/20/2020.  Robley Fries, PhD Marliss Czar, PhD LP Clinical Psychologist, [facility] [address], [address] [place], [place] [address] [phone]

## 2020-12-20 ENCOUNTER — Ambulatory Visit (INDEPENDENT_AMBULATORY_CARE_PROVIDER_SITE_OTHER): Payer: BC Managed Care – PPO | Admitting: Psychiatry

## 2020-12-20 ENCOUNTER — Other Ambulatory Visit: Payer: Self-pay

## 2020-12-20 DIAGNOSIS — Z636 Dependent relative needing care at home: Secondary | ICD-10-CM | POA: Diagnosis not present

## 2020-12-20 DIAGNOSIS — F411 Generalized anxiety disorder: Secondary | ICD-10-CM

## 2020-12-20 DIAGNOSIS — G43909 Migraine, unspecified, not intractable, without status migrainosus: Secondary | ICD-10-CM | POA: Diagnosis not present

## 2020-12-20 NOTE — Progress Notes (Signed)
Psychotherapy Progress Note [facility] Marliss Czar, PhD LP  Patient ID: Patricia Mclean     MRN: [id number] Therapy format: Individual psychotherapy Date: 12/20/2020      Start: 10:17a     Stop: 10:59a     Time Spent: 42 min Location: In-person   Session narrative (presenting needs, interim history, self-report of stressors and symptoms, applications of prior therapy, status changes, and interventions made in session) Walking actively.  Migraine flareup 7/4 with combination of neighborhood fireworks and her dogs going off.    Exercised the freedom to ask her friends at the gathering not to go into the anticipated bitch session about matters at [facility].  Basically some people attentively respected her wishes, while others couldn't resist dishing more.  Created enough of a comfort zone that she could stay for the whole gathering, just gravitating more to those who let up on the subject.  Commended on assertiveness and braving social anxiety to ask.  Recent lunch with a friend and former office neighbor Patricia Mclean involved hearing again about mess at [facility], and Patricia Mclean had a deeper realization of her own freedom, that shenanigans there really aren't her problem, whether they get talked about or not.  Good to find out she didn't have to tense up or run form it, and she does feel better able going forward to hang out with former colleagues without dreading the scene or how it might go.  New apprehension is with Patricia Mclean having to go to [place] for work in about 5 weeks, for a week.  (His company has set up [place] refugee employees there.)  Worries tend to fire up about being nearer a war zone, though she knows it's unlikely.  Worry about his eye as well, and whether he would get.  Coached in imagining what actions Patricia Mclean and others would take to meet imaginable problems.    Therapeutic modalities: Cognitive Behavioral Therapy and Solution-Oriented/Positive Psychology  Mental  Status/Observations:  Appearance:   Casual     Behavior:  Appropriate  Motor:  Normal  Speech/Language:   Clear and Coherent  Affect:  Appropriate  Mood:  normal  Thought process:  normal  Thought content:    WNL  Sensory/Perceptual disturbances:    WNL  Orientation:  Fully oriented  Attention:  Good    Concentration:  Good  Memory:  WNL  Insight:    Good  Judgment:   Good  Impulse Control:  Good   Risk Assessment: Danger to Self: No Self-injurious Behavior: No Danger to Others: No Physical Aggression / Violence: No Duty to Warn: No Access to Firearms a concern: No  Assessment of progress:  progressing  Diagnosis:   ICD-10-CM   1. Generalized anxiety disorder wth panic attacks  F41.1     2. Caregiver stress  Z63.6     3. Migraine without status migrainosus, not intractable, unspecified migraine type  G43.909      Plan:  Continue walking program Make list of worry point for Patricia Mclean's welfare while away, and what helps she imagine would happen -- both Patricia Mclean's actions and others' -- and practice imagining the helps as vividly as the harms.  Recruit Patricia Mclean as needed.   Continue use of panic control skills ad lib Continue newfound assertiveness with friends/colleagues where risk of gossip about old workplace Other recommendations/advice as may be noted above Continue to utilize previously learned skills ad lib Maintain medication as prescribed and work faithfully with relevant prescriber(s) if any changes are desired or  seem indicated Call the clinic on-call service, present to ER, or call 911 if any life-threatening psychiatric crisis Return for session(s) already scheduled. Already scheduled visit in this office 12/28/2020.  Robley Fries, PhD Marliss Czar, PhD LP Clinical Psychologist, [facility] [address] [place], [place] [address] [phone]

## 2020-12-21 IMAGING — XA DG FLUORO GUIDE NDL PLC/BX
2 series · 2 of 2 positions shown · non-contrast
Comparison: none

CLINICAL DATA: LEFT hip pain.

[Series 1: ortho standard · 1 of 1 slices shown (1 of 2)]
[im 1/1]
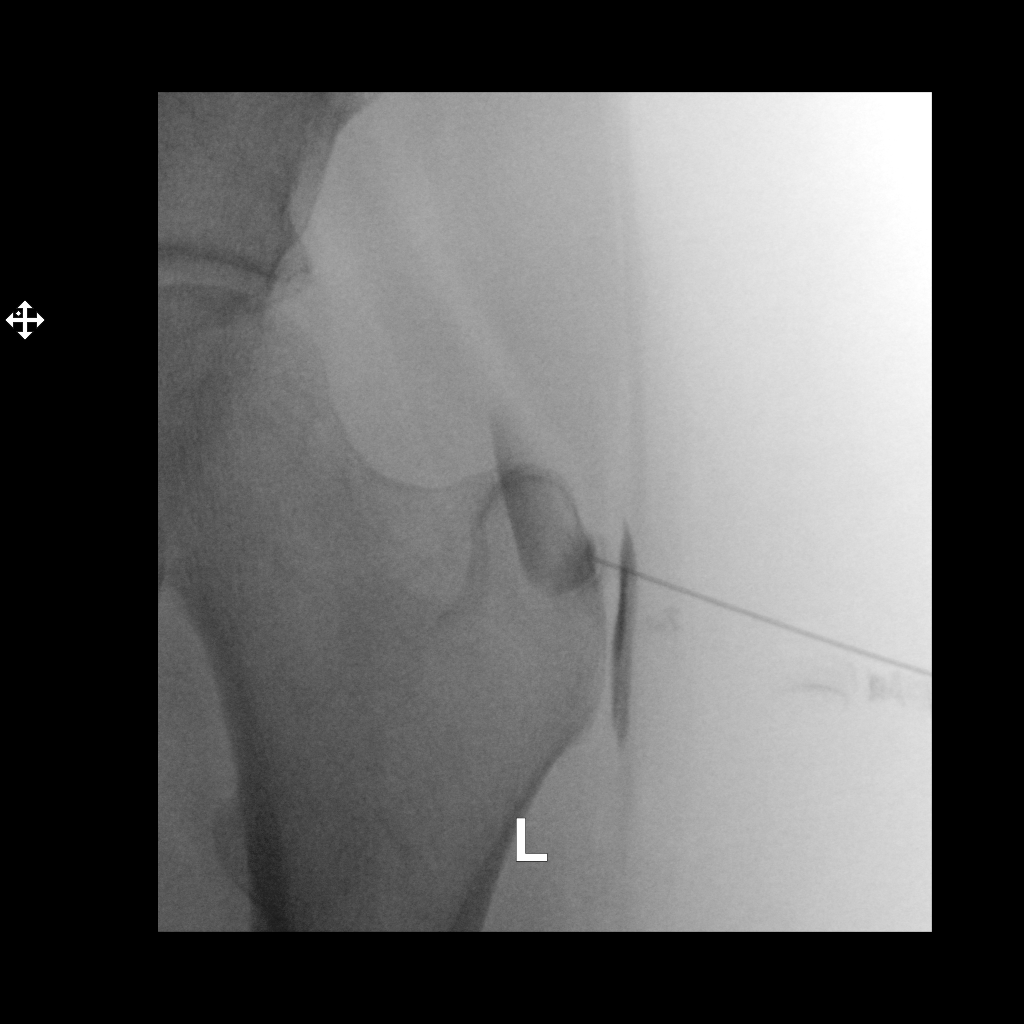

[Series 2: ortho standard · 1 of 1 slices shown (2 of 2)]
[im 1/1]
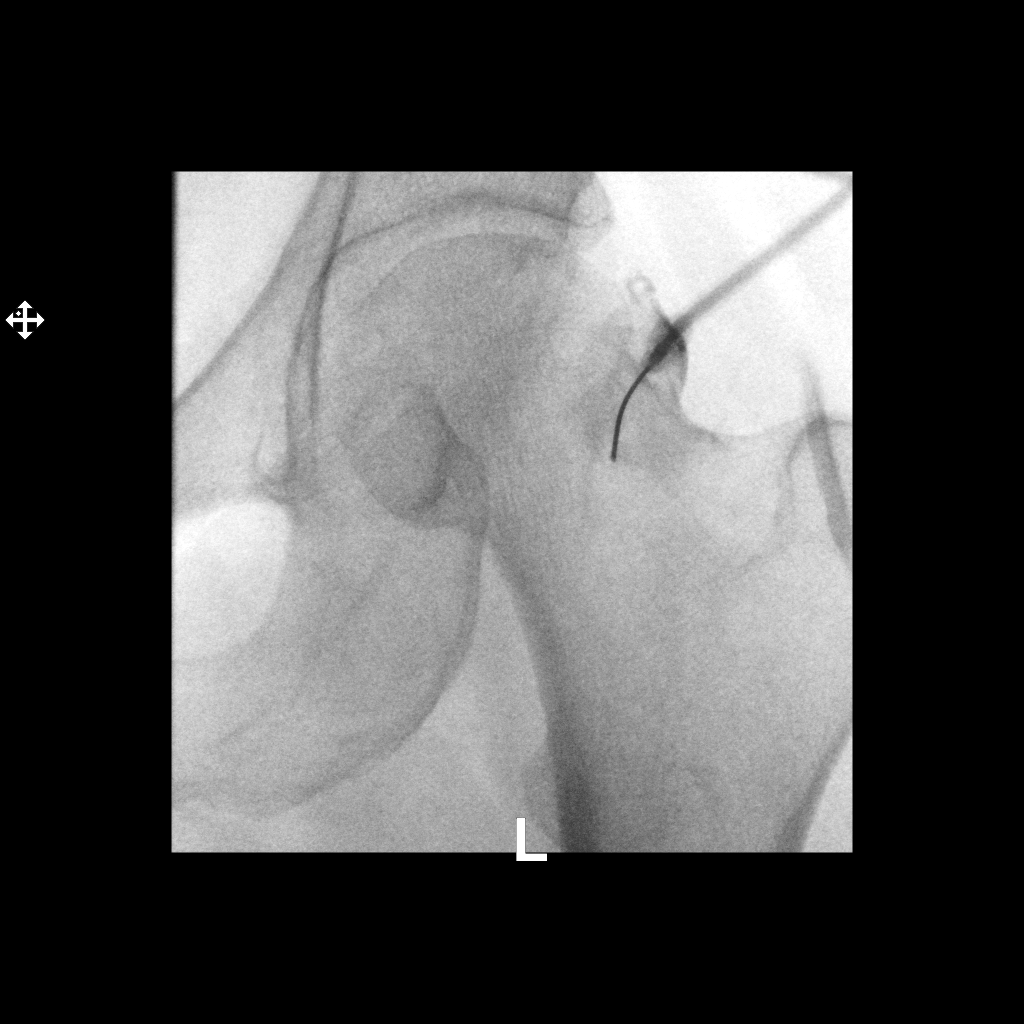

[2 of 2 positions shown; findings below may reference images not displayed]

FLUOROSCOPY TIME:  18 seconds corresponding to a Dose Area Product
of 25 Gy*m2

PROCEDURE:
LEFT HIP JOINT INTRA-ARTICULAR INJECTION UNDER FLUOROSCOPY

Informed written consent was obtained.  Time-out was performed.

An appropriate skin entrance site was determined. The site was
marked, prepped with Betadine, draped in the usual sterile fashion,
and infiltrated locally with 1% lidocaine. 22 gauge spinal needle
was advanced to the LEFT hip joint under intermittent fluoroscopy. 1
ml of Lidocaine injected easily. Injection of 2 mm Isovue-M 200
confirmed intra-articular spread.

Subsequently, injected 80 mg Depo-Medrol along with 2 mL of 0.5%
Sensorcaine. Postprocedure the patient was improved.
IMPRESSION: Technically successful LEFT hip injection with steroid and
anesthetic.

## 2020-12-28 ENCOUNTER — Ambulatory Visit (INDEPENDENT_AMBULATORY_CARE_PROVIDER_SITE_OTHER): Payer: BC Managed Care – PPO | Admitting: Psychiatry

## 2020-12-28 ENCOUNTER — Encounter: Payer: Self-pay | Admitting: Psychiatry

## 2020-12-28 ENCOUNTER — Other Ambulatory Visit: Payer: Self-pay

## 2020-12-28 VITALS — BP 122/75 | HR 70 | Ht 63.0 in | Wt 181.0 lb

## 2020-12-28 DIAGNOSIS — F411 Generalized anxiety disorder: Secondary | ICD-10-CM | POA: Diagnosis not present

## 2020-12-28 MED ORDER — DULOXETINE HCL 30 MG PO CPEP: ORAL_CAPSULE | ORAL | 0 refills | Status: DC

## 2020-12-28 MED ORDER — DULOXETINE HCL 60 MG PO CPEP: 60.0000 mg | ORAL_CAPSULE | Freq: Every day | ORAL | 1 refills | Status: DC

## 2020-12-28 MED ORDER — CITALOPRAM HYDROBROMIDE 20 MG PO TABS: ORAL_TABLET | ORAL | Status: DC

## 2020-12-28 MED ORDER — ALPRAZOLAM 0.25 MG PO TABS: ORAL_TABLET | ORAL | 1 refills | Status: DC

## 2020-12-28 NOTE — Progress Notes (Signed)
[facility] MD/PA/NP Initial Note  12/29/2020 9:06 AM Patricia Mclean  MRN:  [id number]  Chief Complaint:  Chief Complaint   Anxiety; Depression     HPI: Patient is a 50 year old female being seen for initial evaluation for anxiety and depression.  She is referred by her therapist, Marliss Czar, PhD. She reports that she has had chronic anxiety with escalation in the last few years in the context of multiple acute stressors and losses. In 2018 there were 4 deaths in her family. In 2019 her husband had 6 emergency eye surgeries and almost lost his vision. She had to have an emergency hip replacement in early 2020 and then she was able to get PT due to the COVID pandemic closures. 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 She reports that she no longer wants to travel and is fearful that her husband could lose his vision, particularly since they were told if they had traveled to [place] as planned he would have probably lost his vision without immediate access to health care providers.   She reports that her mother died of cancer after prolonged illness when Trinidad was 50 yo and she reports that her anxiety likely originated around the time of mother's illness. She reports excessive worry, catastrophic thinking, and rumination. Denies intrusive thoughts. She notices GI s/s with increased anxiety and will become nauseous and have "stress diarrhea." She frequently clinches her jaw and wears a night guard and has cracked a tooth in the past due to clinching her jaw. She reports that she has panic attacks and recently they have been occurring a couple of times a month. She reports that certain things will trigger panic attacks, such as going to a large crowded store, boarding for an airplane,etc. She reports that she became a "recluse" after hip surgery and COVID. She reports that she has a list  of certain things she checks before leaving home and denies excessive checking. She denies having rituals and routines.   She reports that self-isolation has led to some depression. She reports that her motivation has been low and does not feel like getting up and doing things she typically does, like cooking and walking her dogs. She reports that they did not travel for Thanksgiving this year for the first time due to low motivation. Energy is low. She reports excessive guilt. Feels guilty that she is "preventing [place] from doing stuff we used to do." She has to take 2-3 Xanax to fall asleep. Estimates sleeping about 6-7 hours a night which is less than her baseline. Denies change in appetite. She reports that she had weight gain in response to hip replacement. She has been cooking less than she did in the past. Denies anhedonia. Enjoys going to the movies. Has started walking again. She denies any change in concentration. Denies SI.   Denies any other past episodes of depression.   Denies any periods of decreased need for sleep, excessive energy, impulsivity, or elevated mood. Denies AH or VH. Denies paranoia.   Husband has to go [place] for work next month.   Raised in [place] and spent summers in [place] with maternal family. Mother died when she was 58 yo and denies any memories of mother being well. Has 2 siblings that are estranged and is close with one of her brothers. She is the youngest.  Has her PhD in [employer]. Married  almost 24 years and reports that husband is very supportive. Husband is from [place] and she is dual citizen with [place] and [place] citizenship. They have enjoyed travel in the past. They have 2 [address] [place] terriers, including a new puppy. Brother-in-law and his family are in [place]. Her brother and his family are in [place]. Father has severe dementia and he lives in [place]. She reports that step-mother also is in poor health and has less ability to care for Savanna's father.  Mother-in-law died, husband's grandmother/mother-figure, someone that they had taken care of for 25 years, and her 49 year old [place]. She reports that they have some friends and supports in the area. Enjoys cooking and history.   Past Psychiatric Medication Trials: Paxil-May have caused weight gain. Limited improvement. Took 20 mg.  Celexa- Denies significant improvement in mood or anxiety. Has taken for about a year.  Topamax-Prescribed for migraines Alprazolam-Helpful for sleep initiation Gabapentin- Prescribed for post-operative nerve pain. Took briefly. Did not seem to improve anxiety.    Visit Diagnosis:    ICD-10-CM   1. Generalized anxiety disorder  F41.1 citalopram (CELEXA) 20 MG tablet    DULoxetine (CYMBALTA) 30 MG capsule    DULoxetine (CYMBALTA) 60 MG capsule    ALPRAZolam (XANAX) 0.25 MG tablet      Past Psychiatric History: Saw PCP for medication management. Started therapy with Marliss CzarAndy Mitchum, PhD in April 2022. Denies any other past psychiatric treatment.   Past Medical History:  Past Medical History:  Diagnosis Date   Anxiety    GERD (gastroesophageal reflux disease)    Headache    metoprolol and topamx   Pneumonia    x3 had vaccine    Past Surgical History:  Procedure Laterality Date   TONSILLECTOMY     TOTAL HIP ARTHROPLASTY Left 08/22/2018   Procedure: TOTAL HIP ARTHROPLASTY ANTERIOR APPROACH;  Surgeon: Jodi GeraldsGraves, John, MD;  Location: [facility];  Service: Orthopedics;  Laterality: Left;      Family History:  Family History  Problem Relation Age of Onset   Breast cancer Mother    Ovarian cancer Mother    Stroke Mother    Dementia Father    Depression Brother    Anxiety disorder Brother     Social History:  Social History   Socioeconomic History   Marital status: Married    Spouse name: Not on file   Number of children: Not on file   Years of education: Not on file   Highest education level: Not on file  Occupational History   Not on file   Tobacco Use   Smoking status: Never   Smokeless tobacco: Never  Vaping Use   Vaping Use: Never used  Substance and Sexual Activity   Alcohol use: Yes    Alcohol/week: 1.0 standard drink    Types: 1 Glasses of wine per week    Comment: occasional  wine with dinner   Drug use: Never   Sexual activity: Yes  Other Topics Concern   Not on file  Social History Narrative   Not on file   Social Determinants of Health   Financial Resource Strain: Not on file  Food Insecurity: Not on file  Transportation Needs: Not on file  Physical Activity: Not on file  Stress: Not on file  Social Connections: Not on file    Allergies:  Allergies  Allergen Reactions   Penicillins Anaphylaxis and Shortness Of Breath    Did it involve swelling of the face/tongue/throat, SOB, or low BP?  Yes Did it involve sudden or severe rash/hives, skin peeling, or any reaction on the inside of your mouth or nose? No Did you need to seek medical attention at a hospital or doctor's office? Yes When did it last happen?      30+ years If all above answers are "NO", may proceed with cephalosporin use.    Avelox [Moxifloxacin Hcl] Hives   Levofloxacin Hives    Metabolic Disorder Labs: No results found for: HGBA1C, MPG No results found for: PROLACTIN No results found for: CHOL, TRIG, HDL, CHOLHDL, VLDL, LDLCALC No results found for: TSH  Therapeutic Level Labs: No results found for: LITHIUM No results found for: VALPROATE No components found for:  CBMZ  Current Medications: Current Outpatient Medications  Medication Sig Dispense Refill   chlorthalidone (HYGROTON) 25 MG tablet Take 12.5 mg by mouth every morning.     clindamycin (CLEOCIN) 150 MG capsule Take 150 mg by mouth every 6 (six) hours. Prior to dental work     dicyclomine (BENTYL) 10 MG capsule Take 10 mg by mouth 4 (four) times daily -  before meals and at bedtime.     DULoxetine (CYMBALTA) 30 MG capsule Take 1 capsule po q am x 1 week, then 2  capsules po q am 30 capsule 0   DULoxetine (CYMBALTA) 60 MG capsule Take 1 capsule (60 mg total) by mouth daily. 30 capsule 1   famotidine (PEPCID) 40 MG tablet Take by mouth.     fexofenadine (ALLEGRA) 180 MG tablet Take 180 mg by mouth daily.     Methylcellulose, Laxative, (CITRUCEL) 500 MG TABS Take by mouth.     metoprolol tartrate (LOPRESSOR) 50 MG tablet Take 50 mg by mouth 2 (two) times daily.     Multiple Vitamins-Minerals (ONE-A-DAY WOMENS PO) Take by mouth.     omeprazole (PRILOSEC) 40 MG capsule Take 40 mg by mouth daily.     Potassium Chloride ER 20 MEQ TBCR Take 2 tablets by mouth 2 (two) times daily.     RABEprazole (ACIPHEX) 20 MG tablet Take 20 mg by mouth 2 (two) times daily.     rosuvastatin (CRESTOR) 20 MG tablet Take 20 mg by mouth daily.     sucralfate (CARAFATE) 1 g tablet Take by mouth.     Topiramate ER (TROKENDI XR) 100 MG CP24 Take 100 mg by mouth 2 (two) times daily.     vitamin B-12 (CYANOCOBALAMIN) 1000 MCG tablet Take 1,000 mcg by mouth daily.     ALPRAZolam (XANAX) 0.25 MG tablet Take 2-3 tablets at bedtime 90 tablet 1   citalopram (CELEXA) 20 MG tablet Take 1/2 tablet daily for one week, then stop     dicyclomine (BENTYL) 10 MG capsule Take by mouth.     ibuprofen (ADVIL) 800 MG tablet Take by mouth.     ondansetron (ZOFRAN-ODT) 4 MG disintegrating tablet Take by mouth. Takes 1-2 as needed     No current facility-administered medications for this visit.    Medication Side Effects: none  Orders placed this visit:  No orders of the defined types were placed in this encounter.   Psychiatric Specialty Exam:  Review of Systems  Constitutional:  Positive for unexpected weight change.  HENT: Negative.    Eyes: Negative.   Respiratory: Negative.    Cardiovascular: Negative.   Gastrointestinal: Negative.        Heartburn  Endocrine: Negative.   Genitourinary: Negative.   Musculoskeletal: Negative.        Improved hip  pain with PT  Skin: Negative.    Allergic/Immunologic: Negative.   Neurological:  Positive for headaches.  Hematological: Negative.   Psychiatric/Behavioral:         Please refer to HPI   Blood pressure 122/75, pulse 70, height 5\' 3"  (1.6 m), weight 181 lb (82.1 kg).Body mass index is 32.06 kg/m.  General Appearance: Casual  Eye Contact:  Good  Speech:  Clear and Coherent and Normal Rate  Volume:  Normal  Mood:  Anxious and Depressed  Affect:  Depressed, Full Range, Tearful, and Anxious  Thought Process:  Coherent, Linear, and Descriptions of Associations: Intact  Orientation:  Full (Time, Place, and Person)  Thought Content: Logical, Hallucinations: None, and Rumination   Suicidal Thoughts:  No  Homicidal Thoughts:  No  Memory:  WNL  Judgement:  Good  Insight:  Good  Psychomotor Activity:  Normal  Concentration:  Concentration: Good  Recall:  Good  Fund of Knowledge: Good  Language: Good  Assets:  Communication Skills Desire for Improvement Financial Resources/Insurance Housing Resilience Social Support Vocational/Educational  ADL's:  Intact  Cognition: WNL  Prognosis:  Good   Screenings:  GAD-7    Flowsheet Row Counselor from 11/23/2020 in [facility]  Total GAD-7 Score 14      PHQ2-9    Flowsheet Row Counselor from 11/23/2020 in [facility]  PHQ-2 Total Score 3  PHQ-9 Total Score 11       Receiving Psychotherapy: Yes   Treatment Plan/Recommendations: Patient seen for 60 minutes and time spent counseling patient regarding current signs and symptoms, diagnosis, and treatment plan.  Reviewed different types of medications used to treat anxiety and depression and their proposed mechanisms of action.  Discussed that her signs and symptoms have not been improved by 2 SSRIs, and therefore recommend starting a medication with a different mechanism of action such as an SNRI or an antidepressant with a novel mechanism of action such as Viibryd.  Discussed potential  benefits, risks, and side effects of Cymbalta and Viibryd with patient.  Patient agrees to trial of Cymbalta.  Will start Cymbalta 30 mg daily for 1 week, then increase to 60 mg daily for anxiety and depression.  Discussed gradually decreasing citalopram to minimize risk of discontinuation signs and symptoms.  Decrease citalopram to 20 mg 1/2 tablet daily for 1 week, then stop.  Will continue alprazolam 0.25 mg 2 to 3 tablets at bedtime for insomnia.  Recommend continuing psychotherapy.  Patient to follow-up with this provider in 5 to 6 weeks or sooner if clinically indicated. Patient advised to contact office with any questions, adverse effects, or acute worsening in signs and symptoms.     11/25/2020, PMHNP

## 2020-12-28 NOTE — Patient Instructions (Addendum)
Take Citalopram 1/2 tablet daily for one week, then stop.  Take Duloxetine (Cymbalta) 30 mg in the morning for one week, then increase to 60 mg in the morning. Initially you will take two of the 30 mg capsules and when this runs low you can request that pharmacy fill script on file for 60 mg capsules.

## 2021-01-03 ENCOUNTER — Encounter: Payer: Self-pay | Admitting: Orthopaedic Surgery

## 2021-01-03 ENCOUNTER — Other Ambulatory Visit: Payer: Self-pay

## 2021-01-03 ENCOUNTER — Ambulatory Visit (INDEPENDENT_AMBULATORY_CARE_PROVIDER_SITE_OTHER): Payer: BC Managed Care – PPO | Admitting: Orthopaedic Surgery

## 2021-01-03 DIAGNOSIS — M7711 Lateral epicondylitis, right elbow: Secondary | ICD-10-CM

## 2021-01-03 MED ORDER — LIDOCAINE HCL 1 % IJ SOLN
1.0000 mL | INTRAMUSCULAR | Status: AC | PRN
  Administered 2021-01-03: 1 mL

## 2021-01-03 MED ORDER — METHYLPREDNISOLONE ACETATE 40 MG/ML IJ SUSP
40.0000 mg | INTRAMUSCULAR | Status: AC | PRN
Start: 2021-01-03 — End: 2021-01-03
  Administered 2021-01-03: 40 mg

## 2021-01-03 NOTE — Progress Notes (Signed)
Office Visit Note   Patient: Patricia Mclean           Date of Birth: [birth year]           MRN: [id number] Visit Date: 01/03/2021              Requested by: Mosetta Putt, MD [address] [place] [place],  [place] [address] PCP: Mosetta Putt, MD   Assessment & Plan: Visit Diagnoses:  1. Lateral epicondylitis of right elbow     Plan: I did recommend a steroid injection over the lateral epicondyle of the right elbow and she agreed to this and tolerated it well.  I wonder try combination of stretching and Voltaren gel and avoid picking things up with her hand in a pronated position.  Follow-up will be as needed.  However, I can see her sometime before she travels to consider a left hip trochanteric injection if needed.  Follow-Up Instructions: Return if symptoms worsen or fail to improve.   Orders:  Orders Placed This Encounter  Procedures   Hand/UE Inj   No orders of the defined types were placed in this encounter.     Procedures: Hand/UE Inj: R elbow for lateral epicondylitis on 01/03/2021 10:24 AM Medications: 1 mL lidocaine 1 %; 40 mg methylPREDNISolone acetate 40 MG/ML     Clinical Data: No additional findings.   Subjective: Chief Complaint  Patient presents with   Left Elbow - Pain  The patient is a well-known 50 year old female.  She is right-hand dominant and has been dealing with right lateral elbow pain for about 4 months now.  She points to the lateral epicondylar area as a source of her pain.  It does throb at night.  There is been no known injury and she cannot think of any repetitive activity that is causing this.  She does have a history of a left total hip arthroplasty and occasionally gets chronic trochanteric bursitis.  She will be traveling later this fall and I think it be reasonable to consider a trochanteric injection before she started to travel on the left side.  She denies any numbness and tingling in the right upper extremity and denies any  trauma to that area.  HPI  Review of Systems There is currently listed no headache, chest pain, shortness of breath, fever, chills, nausea, vomiting  Objective: Vital Signs: There were no vitals taken for this visit.  Physical Exam She is alert and orient x3 and in no acute distress Ortho Exam Examination of her right elbow shows pain over the lateral epicondyle.  The elbow is ligamentously stable.  Her signs symptoms are consistent with lateral epicondylitis.  The remainder of her upper extremity exam on the right side is normal. Specialty Comments:  No specialty comments available.  Imaging: No results found.   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   Past Medical History:  Diagnosis Date   Anxiety    GERD (gastroesophageal reflux disease)    Headache    metoprolol and topamx   Pneumonia    x3 had vaccine    Family History  Problem Relation Age of Onset   Breast cancer Mother    Ovarian cancer Mother    Stroke Mother    Dementia Father    Depression Brother    Anxiety disorder Brother     Past Surgical History:  Procedure Laterality Date   TONSILLECTOMY     TOTAL HIP ARTHROPLASTY Left 08/22/2018   Procedure: TOTAL HIP ARTHROPLASTY ANTERIOR APPROACH;  Surgeon: Jodi Geralds, MD;  Location: [facility];  Service: Orthopedics;  Laterality: Left;   Social History   Occupational History   Not on file  Tobacco Use   Smoking status: Never   Smokeless tobacco: Never  Vaping Use   Vaping Use: Never used  Substance and Sexual Activity   Alcohol use: Yes    Alcohol/week: 1.0 standard drink    Types: 1 Glasses of wine per week    Comment: occasional  wine with dinner   Drug use: Never   Sexual activity: Yes

## 2021-01-04 ENCOUNTER — Ambulatory Visit (INDEPENDENT_AMBULATORY_CARE_PROVIDER_SITE_OTHER): Payer: BC Managed Care – PPO | Admitting: Psychiatry

## 2021-01-04 DIAGNOSIS — F411 Generalized anxiety disorder: Secondary | ICD-10-CM

## 2021-01-04 DIAGNOSIS — F4321 Adjustment disorder with depressed mood: Secondary | ICD-10-CM | POA: Diagnosis not present

## 2021-01-04 DIAGNOSIS — F5104 Psychophysiologic insomnia: Secondary | ICD-10-CM
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
## 2021-01-17 ENCOUNTER — Ambulatory Visit: Payer: BC Managed Care – PPO | Admitting: Psychiatry

## 2021-01-31 ENCOUNTER — Ambulatory Visit (INDEPENDENT_AMBULATORY_CARE_PROVIDER_SITE_OTHER): Payer: BC Managed Care – PPO | Admitting: Psychiatry

## 2021-01-31 ENCOUNTER — Other Ambulatory Visit: Payer: Self-pay

## 2021-01-31 DIAGNOSIS — F411 Generalized anxiety disorder: Secondary | ICD-10-CM | POA: Diagnosis not present

## 2021-01-31 DIAGNOSIS — Z638 Other specified problems related to primary support group: Secondary | ICD-10-CM

## 2021-01-31 DIAGNOSIS — F5104 Psychophysiologic insomnia: Secondary | ICD-10-CM

## 2021-01-31 NOTE — Progress Notes (Signed)
Psychotherapy Progress Note [facility] Patricia Czar, PhD LP  Patient ID: Patricia Mclean     MRN: [id number] Therapy format: Individual psychotherapy Date: 01/31/2021      Start: 2:04p     Stop: 2:53p     Time Spent: 49 min Location: In-person   Session narrative (presenting needs, interim history, self-report of stressors and symptoms, applications of prior therapy, status changes, and interventions made in session) Successfully quarantined, to help make sure Patricia Mclean did not get COVID before his international travel.  Had   Med check tomorrow.  Does feel that med change has helped her energy, put a better floor under her depression.  Did feel some relapse while Patricia Mclean was away, but this is the first in a good while he has been away, and he has been her emotional crutch for anxiety.  Holed up, withdrew, cancelled plans until he came back.  Actually, he's never been this kind of unavailable in 26 years together, and sleep came hard, anxiety came easy.  Reviewed handling of the separation and how to better support coping.  Another small victory to report in resolved to travel with [place] early December, braving fear of being caught away from help.  Affirmed an encouraged in taking on uncertain circumstances and braving them for normalcy's sake.   Therapeutic modalities: Cognitive Behavioral Therapy and Solution-Oriented/Positive Psychology  Mental Status/Observations:  Appearance:   Casual     Behavior:  Appropriate  Motor:  Normal  Speech/Language:   Clear and Coherent  Affect:  Appropriate  Mood:  anxious and less  Thought process:  normal  Thought content:    WNL  Sensory/Perceptual disturbances:    WNL  Orientation:  Fully oriented  Attention:  Good    Concentration:  Good  Memory:  WNL  Insight:    Good  Judgment:   Good  Impulse Control:  Good   Risk Assessment: Danger to Self: No Self-injurious Behavior: No Danger to Others: No Physical Aggression /  Violence: No Duty to Warn: No Access to Firearms a concern: No  Assessment of progress:  progressing  Diagnosis:   ICD-10-CM   1. Generalized anxiety disorder  F41.1     2. Psychophysiological insomnia  F51.04     3. Relationship problem with family members  Z63.8      Plan:  Continue to use self-soothing techniques ad lib Continue to seek and make commitments to dealing with temporary separation, worry Continue physical activity and social outings as desired for mood and morale Use worry control technique before bedtime and as needed, including worry inventory, explicit decision making whether to problem solve or shelve issues, and use of tangible or imaginary worry repository for the night Manage blue light exposure by setting devices' display settings, option to order and use amber lenses for more complete effect For affordability worries, challenged to do the math explicitly, including how many times it would take spending on a particular indulgence to spend down known reserves Other recommendations/advice as may be noted above Continue to utilize previously learned skills ad lib Maintain medication as prescribed and work faithfully with relevant prescriber(s) if any changes are desired or seem indicated Call the clinic on-call service, present to ER, or call 911 if any life-threatening psychiatric crisis Return in about 2 weeks (around 02/14/2021). Already scheduled visit in this office 02/01/2021.  Robley Fries, PhD Patricia Czar, PhD LP Clinical Psychologist, [facility] [phone]  [place], [place] [address] ([phone]

## 2021-02-01 ENCOUNTER — Ambulatory Visit (INDEPENDENT_AMBULATORY_CARE_PROVIDER_SITE_OTHER): Payer: BC Managed Care – PPO | Admitting: Psychiatry

## 2021-02-01 ENCOUNTER — Encounter: Payer: Self-pay | Admitting: Psychiatry

## 2021-02-01 DIAGNOSIS — F4321 Adjustment disorder with depressed mood: Secondary | ICD-10-CM | POA: Diagnosis not present

## 2021-02-01 DIAGNOSIS — F411 Generalized anxiety disorder: Secondary | ICD-10-CM

## 2021-02-01 MED ORDER — BUSPIRONE HCL 15 MG PO TABS: ORAL_TABLET | ORAL | 1 refills | Status: DC

## 2021-02-01 MED ORDER — DULOXETINE HCL 60 MG PO CPEP: 60.0000 mg | ORAL_CAPSULE | Freq: Every day | ORAL | 0 refills | Status: DC

## 2021-02-01 MED ORDER — ALPRAZOLAM 0.25 MG PO TABS: ORAL_TABLET | ORAL | 1 refills | Status: DC

## 2021-02-01 NOTE — Progress Notes (Signed)
Patricia EthStephanie O Nowack [id number] 12/09/1970 50 y.o.  Subjective:   Patient ID:  Patricia Mclean is a 50 y.o. (DOB 09/06/1970) female.  Chief Complaint:  Chief Complaint  Patient presents with   Anxiety   Depression   Sleeping Problem    HPI Patricia Mclean presents to the office today for follow-up of anxiety, depression, and sleep disturbance. She reports, "I started feeling a lot better" and noticed increased energy and motivation. She reports that she had a difficult week last week and attributes this to husband traveling to [place] and communication being limited. She reports last week she cancelled dentist apt and plans and stayed home. She reports before last week she was walking her dogs. She agreed to go to husband's work retreat in [place] in December and states she probably would not have done this in previous weeks and did not go last year. She reports feeling more optimistic. Reports mood has been happy this week since husband returned. She feels that energy and motivation have been gradually improving. She reports anxiety has been improving but not "where I want it to be." She had anxiety when her dad and step-mother were indifferent about getting additional COVID booster and then obsesses about this and was unable to sleep that night. She reports worry has improved somewhat. She reports "a lot more improvement on the depression side" and less change with anxiety s/s. Continued catastrophic thinking. Increased interest and enjoyment in things. She reports that GI s/s associated with anxiety have improved. Jaw clinching has decreased. Had panic attacks while husband was away.   Difficulty falling asleep. Staying asleep more easily. Sleeping closer to 8 hours. She reports that she lost some weight and this helped motivate her. Appetite has decreased. Guilt has been better. Recently went to grocery store with husband. Denies SI.   Husband returned home Saturday. Father is  almost 49 yo.   Taking Xanax 0.25 mg 2-3 tabs at bedtime in order to fall asleep.   Past Psychiatric Medication Trials: Paxil-May have caused weight gain. Limited improvement. Took 20 mg.  Celexa- Denies significant improvement in mood or anxiety. Has taken for about a year.  Topamax-Prescribed for migraines Alprazolam-Helpful for sleep initiation Gabapentin- Prescribed for post-operative nerve pain. Took briefly. Did not seem to improve anxiety.   GAD-7    Flowsheet Row Counselor from 11/23/2020 in [facility]  Total GAD-7 Score 14      PHQ2-9    Flowsheet Row Counselor from 11/23/2020 in [facility]  PHQ-2 Total Score 3  PHQ-9 Total Score 11        Review of Systems:  Review of Systems  Gastrointestinal: Negative.   Musculoskeletal:  Negative for gait problem.       Hip pain  Psychiatric/Behavioral:         Please refer to HPI   Medications: I have reviewed the patient's current medications.  Current Outpatient Medications  Medication Sig Dispense Refill   busPIRone (BUSPAR) 15 MG tablet Take 1/3 tablet p.o. twice daily for 1 week, then take 2/3 tablet p.o. twice daily for 1 week, then take 1 tablet p.o. twice daily 60 tablet 1   [START ON 03/01/2021] ALPRAZolam (XANAX) 0.25 MG tablet Take 2-3 tablets at bedtime 90 tablet 1   chlorthalidone (HYGROTON) 25 MG tablet Take 12.5 mg by mouth every morning.     clindamycin (CLEOCIN) 150 MG capsule Take 150 mg by mouth every 6 (six) hours. Prior to dental work  dicyclomine (BENTYL) 10 MG capsule Take by mouth.     dicyclomine (BENTYL) 10 MG capsule Take 10 mg by mouth 4 (four) times daily -  before meals and at bedtime.     DULoxetine (CYMBALTA) 60 MG capsule Take 1 capsule (60 mg total) by mouth daily. 90 capsule 0   famotidine (PEPCID) 40 MG tablet Take by mouth.     fexofenadine (ALLEGRA) 180 MG tablet Take 180 mg by mouth daily.     ibuprofen (ADVIL) 800 MG tablet Take by mouth.      Methylcellulose, Laxative, (CITRUCEL) 500 MG TABS Take by mouth.     metoprolol tartrate (LOPRESSOR) 50 MG tablet Take 50 mg by mouth 2 (two) times daily.     Multiple Vitamins-Minerals (ONE-A-DAY WOMENS PO) Take by mouth.     omeprazole (PRILOSEC) 40 MG capsule Take 40 mg by mouth daily.     ondansetron (ZOFRAN-ODT) 4 MG disintegrating tablet Take by mouth. Takes 1-2 as needed     Potassium Chloride ER 20 MEQ TBCR Take 2 tablets by mouth 2 (two) times daily.     RABEprazole (ACIPHEX) 20 MG tablet Take 20 mg by mouth 2 (two) times daily.     rosuvastatin (CRESTOR) 20 MG tablet Take 20 mg by mouth daily.     sucralfate (CARAFATE) 1 g tablet Take by mouth.     Topiramate ER (TROKENDI XR) 100 MG CP24 Take 100 mg by mouth 2 (two) times daily.     vitamin B-12 (CYANOCOBALAMIN) 1000 MCG tablet Take 1,000 mcg by mouth daily.     No current facility-administered medications for this visit.    Medication Side Effects: Other: Diarrhea initially and this resolved  Allergies:  Allergies  Allergen Reactions   Penicillins Anaphylaxis and Shortness Of Breath    Did it involve swelling of the face/tongue/throat, SOB, or low BP? Yes Did it involve sudden or severe rash/hives, skin peeling, or any reaction on the inside of your mouth or nose? No Did you need to seek medical attention at a hospital or doctor's office? Yes When did it last happen?      30+ years If all above answers are "NO", may proceed with cephalosporin use.    Avelox [Moxifloxacin Hcl] Hives   Levofloxacin Hives    Past Medical History:  Diagnosis Date   Anxiety    GERD (gastroesophageal reflux disease)    Headache    metoprolol and topamx   Pneumonia    x3 had vaccine    Past Medical History, Surgical history, Social history, and Family history were reviewed and updated as appropriate.   Please see review of systems for further details on the patient's review from today.   Objective:   Physical Exam:  There were no  vitals taken for this visit.  Physical Exam Constitutional:      General: She is not in acute distress. Musculoskeletal:        General: No deformity.  Neurological:     Mental Status: She is alert and oriented to person, place, and time.     Coordination: Coordination normal.  Psychiatric:        Attention and Perception: Attention and perception normal. She does not perceive auditory or visual hallucinations.        Mood and Affect: Mood is anxious. Affect is not labile, blunt, angry or inappropriate.        Speech: Speech normal.        Behavior: Behavior normal.  Thought Content: Thought content normal. Thought content is not paranoid or delusional. Thought content does not include homicidal or suicidal ideation. Thought content does not include homicidal or suicidal plan.        Cognition and Memory: Cognition and memory normal.        Judgment: Judgment normal.     Comments: Insight intact Mood presents as less depressed    Lab Review:     Component Value Date/Time   NA 138 08/21/2018 1503   K 3.9 08/21/2018 1503   CL 105 08/21/2018 1503   CO2 24 08/21/2018 1503   GLUCOSE 92 08/21/2018 1503   BUN 18 08/21/2018 1503   CREATININE 0.72 08/21/2018 1503   CALCIUM 9.2 08/21/2018 1503   PROT 7.5 08/21/2018 1503   ALBUMIN 4.4 08/21/2018 1503   AST 24 08/21/2018 1503   ALT 26 08/21/2018 1503   ALKPHOS 124 08/21/2018 1503   BILITOT 0.6 08/21/2018 1503   GFRNONAA >60 08/21/2018 1503   GFRAA >60 08/21/2018 1503       Component Value Date/Time   WBC 14.4 (H) 08/24/2018 0340   RBC 2.83 (L) 08/24/2018 0340   HGB 9.3 (L) 08/24/2018 0340   HCT 28.9 (L) 08/24/2018 0340   PLT 169 08/24/2018 0340   MCV 102.1 (H) 08/24/2018 0340   MCH 32.9 08/24/2018 0340   MCHC 32.2 08/24/2018 0340   RDW 12.6 08/24/2018 0340   LYMPHSABS 2.1 08/21/2018 1503   MONOABS 0.7 08/21/2018 1503   EOSABS 0.1 08/21/2018 1503   BASOSABS 0.0 08/21/2018 1503    No results found for: POCLITH,  LITHIUM   No results found for: PHENYTOIN, PHENOBARB, VALPROATE, CBMZ   .res Assessment: Plan:    Patient seen for 30 minutes and time spent discussing response to treatment and treatment options.  Discussed that depressive signs and symptoms have significantly improved since initiation of Cymbalta, however she continues to experience significant anxiety.  Discussed that Cymbalta can cause some activation, which is helpful for targeting depressive signs and symptoms, however it may not be as effective for anxiety signs and symptoms in some cases.  Discussed that BuSpar is frequently used in combination with Cymbalta to target anxiety signs and symptoms. Discussed potential benefits, risks, and side effects of BuSpar.  Patient agrees to trial of BuSpar.  Will start BuSpar 15 mg 1/3 tablet twice daily for 1 week, then increase to 2/3 tablet twice daily for 1 week, then increase to 1 tablet twice daily for anxiety.   Discussed that sleep disturbance may resolve with reduction in overall anxiety. Will continue Cymbalta 60 mg daily for depressive signs and symptoms. Patient to follow-up with this provider in approximately 6 weeks or sooner if clinically indicated. Recommend continuing psychotherapy with Dr. Farrel Demark. Patient advised to contact office with any questions, adverse effects, or acute worsening in signs and symptoms.    Inocencia was seen today for anxiety, depression and sleeping problem.  Diagnoses and all orders for this visit:  Generalized anxiety disorder -     DULoxetine (CYMBALTA) 60 MG capsule; Take 1 capsule (60 mg total) by mouth daily. -     busPIRone (BUSPAR) 15 MG tablet; Take 1/3 tablet p.o. twice daily for 1 week, then take 2/3 tablet p.o. twice daily for 1 week, then take 1 tablet p.o. twice daily -     ALPRAZolam (XANAX) 0.25 MG tablet; Take 2-3 tablets at bedtime  Adjustment disorder with depressed mood    Please see After Visit Summary for patient  specific  instructions.  Future Appointments  Date Time Provider Department Center  02/15/2021  1:00 PM Robley Fries, PhD [facility] None  03/14/2021 10:00 AM Robley Fries, PhD [facility] None  03/16/2021  2:15 PM Kathryne Hitch, MD [facility] None  03/17/2021 11:00 AM Corie Chiquito, PMHNP [facility] None  04/07/2021 11:00 AM Robley Fries, PhD [facility] None  04/19/2021 11:00 AM Robley Fries, PhD [facility] None  05/09/2021 11:00 AM Robley Fries, PhD [facility] None  05/23/2021 11:00 AM Robley Fries, PhD [facility] None    No orders of the defined types were placed in this encounter.   -------------------------------

## 2021-02-15 ENCOUNTER — Ambulatory Visit: Payer: BC Managed Care – PPO | Admitting: Psychiatry

## 2021-02-16 ENCOUNTER — Ambulatory Visit (INDEPENDENT_AMBULATORY_CARE_PROVIDER_SITE_OTHER): Payer: BC Managed Care – PPO | Admitting: Psychiatry

## 2021-02-16 ENCOUNTER — Other Ambulatory Visit: Payer: Self-pay

## 2021-02-16 DIAGNOSIS — F4321 Adjustment disorder with depressed mood: Secondary | ICD-10-CM | POA: Diagnosis not present

## 2021-02-16 DIAGNOSIS — F411 Generalized anxiety disorder: Secondary | ICD-10-CM

## 2021-02-16 DIAGNOSIS — F5104 Psychophysiologic insomnia: Secondary | ICD-10-CM | POA: Diagnosis not present
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
## 2021-02-28 ENCOUNTER — Ambulatory Visit: Payer: BC Managed Care – PPO | Admitting: Psychiatry

## 2021-03-14 ENCOUNTER — Ambulatory Visit: Payer: BC Managed Care – PPO | Admitting: Psychiatry

## 2021-03-16 ENCOUNTER — Ambulatory Visit: Payer: Self-pay

## 2021-03-16 ENCOUNTER — Ambulatory Visit: Payer: BC Managed Care – PPO | Admitting: Orthopaedic Surgery

## 2021-03-16 DIAGNOSIS — Z96642 Presence of left artificial hip joint: Secondary | ICD-10-CM

## 2021-03-16 DIAGNOSIS — M7061 Trochanteric bursitis, right hip: Secondary | ICD-10-CM

## 2021-03-16 DIAGNOSIS — M7062 Trochanteric bursitis, left hip: Secondary | ICD-10-CM

## 2021-03-16 MED ORDER — METHYLPREDNISOLONE ACETATE 40 MG/ML IJ SUSP
40.0000 mg | INTRAMUSCULAR | Status: AC | PRN
  Administered 2021-03-16: 40 mg via INTRA_ARTICULAR

## 2021-03-16 MED ORDER — LIDOCAINE HCL 1 % IJ SOLN
3.0000 mL | INTRAMUSCULAR | Status: AC | PRN
  Administered 2021-03-16: 3 mL

## 2021-03-16 NOTE — Progress Notes (Signed)
Office Visit Note   Patient: Patricia Mclean           Date of Birth: 29-Dec-1970           MRN: [id number] Visit Date: 03/16/2021              Requested by: Mosetta Putt, MD [address] [place],  [place] [address] PCP: Mosetta Putt, MD   Assessment & Plan: Visit Diagnoses:  1. History of total left hip replacement   2. Trochanteric bursitis, left hip   3. Trochanteric bursitis, right hip     Plan: I feel that it is reasonable to place steroid injections around both hip trochanteric areas given the pain she is experiencing.  Also she is not a diabetic and very healthy.  She did tolerate the injections well.  All questions and concerns were answered and addressed.  At this point follow-up can be as needed.  Follow-Up Instructions: Return if symptoms worsen or fail to improve.   Orders:  Orders Placed This Encounter  Procedures   Large Joint Inj   Large Joint Inj   No orders of the defined types were placed in this encounter.     Procedures: Large Joint Inj: L greater trochanter on 03/16/2021 2:31 PM Indications: pain and diagnostic evaluation Details: 22 G 1.5 in needle, lateral approach  Arthrogram: No  Medications: 3 mL lidocaine 1 %; 40 mg methylPREDNISolone acetate 40 MG/ML Outcome: tolerated well, no immediate complications Procedure, treatment alternatives, risks and benefits explained, specific risks discussed. Consent was given by the patient. Immediately prior to procedure a time out was called to verify the correct patient, procedure, equipment, support staff and site/side marked as required. Patient was prepped and draped in the usual sterile fashion.    Large Joint Inj: R greater trochanter on 03/16/2021 2:31 PM Indications: pain and diagnostic evaluation Details: 22 G 1.5 in needle, lateral approach  Arthrogram: No  Medications: 3 mL lidocaine 1 %; 40 mg methylPREDNISolone acetate 40 MG/ML Outcome: tolerated well, no immediate  complications Procedure, treatment alternatives, risks and benefits explained, specific risks discussed. Consent was given by the patient. Immediately prior to procedure a time out was called to verify the correct patient, procedure, equipment, support staff and site/side marked as required. Patient was prepped and draped in the usual sterile fashion.      Clinical Data: No additional findings.   Subjective: Chief Complaint  Patient presents with   Left Hip - Follow-up    08/22/18 left THA  The patient is well-known to me.  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  She is leaving soon to go out of the country and is requesting a steroid injection of the left hip today.  He does feel better overall except for she still cannot lay on that side.  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  There is a small nidus of AVN in her right hip on that MRI.  She is having some right hip pain but just on the lateral aspect of her hip.  At her last visit I actually injected her lateral epicondylitis of the right elbow.  She said that is improved significantly and she has no issues right now with her right elbow.  HPI  Review of Systems There is currently listed no headache, chest pain, shortness of breath, fever, chills, nausea, vomiting  Objective: Vital  Signs: There were no vitals taken for this visit.  Physical Exam She is alert and orient x3 and in no acute distress Ortho Exam Examination of both hips show the move smoothly and fluidly.  There is no pain in the groin on either hip.  There is pain to palpation over the trochanteric area of both hips and the proximal IT band. Specialty Comments:  No specialty comments available.  Imaging: No results found.   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   Past Medical History:  Diagnosis Date   Anxiety    GERD (gastroesophageal reflux disease)    Headache    metoprolol and topamx   Pneumonia    x3 had vaccine    Family History  Problem Relation Age of Onset   Breast cancer Mother    Ovarian cancer Mother    Stroke Mother    Dementia Father    Depression Brother    Anxiety disorder Brother     Past Surgical History:  Procedure Laterality Date   TONSILLECTOMY     TOTAL HIP ARTHROPLASTY Left 08/22/2018   Procedure: TOTAL HIP ARTHROPLASTY ANTERIOR APPROACH;  Surgeon: Jodi Geralds, MD;  Location: [facility];  Service: Orthopedics;  Laterality: Left;   Social History   Occupational History   Not on file  Tobacco Use   Smoking status: Never   Smokeless tobacco: Never  Vaping Use   Vaping Use: Never used  Substance and Sexual Activity   Alcohol use: Yes    Alcohol/week: 1.0 standard drink    Types: 1 Glasses of wine per week    Comment: occasional  wine with dinner   Drug use: Never   Sexual activity: Yes

## 2021-03-17 ENCOUNTER — Ambulatory Visit: Payer: BC Managed Care – PPO | Admitting: Psychiatry

## 2021-03-17 ENCOUNTER — Encounter: Payer: Self-pay | Admitting: Psychiatry

## 2021-03-17 ENCOUNTER — Other Ambulatory Visit: Payer: Self-pay

## 2021-03-17 DIAGNOSIS — F411 Generalized anxiety disorder: Secondary | ICD-10-CM | POA: Diagnosis not present

## 2021-03-17 MED ORDER — SERTRALINE HCL 50 MG PO TABS: ORAL_TABLET | ORAL | 1 refills | Status: DC

## 2021-03-17 MED ORDER — DULOXETINE HCL 60 MG PO CPEP: 60.0000 mg | ORAL_CAPSULE | Freq: Every day | ORAL | 0 refills | Status: DC

## 2021-03-17 MED ORDER — BUSPIRONE HCL 15 MG PO TABS: ORAL_TABLET | ORAL | 1 refills | Status: DC

## 2021-03-17 MED ORDER — BUSPIRONE HCL 30 MG PO TABS: 30.0000 mg | ORAL_TABLET | Freq: Two times a day (BID) | ORAL | 1 refills | Status: DC

## 2021-03-17 NOTE — Patient Instructions (Addendum)
Take Buspar 1.5 tabs BID for 4 days, then increase to 30 mg BID. A script for the 30 mg tabs has been sent to pharmacy.  Start Sertraline 25 mg for 7 days, then increase to 50 mg daily.
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
## 2021-03-23 ENCOUNTER — Other Ambulatory Visit: Payer: Self-pay | Admitting: Family Medicine

## 2021-03-23 DIAGNOSIS — R922 Inconclusive mammogram: Secondary | ICD-10-CM

## 2021-03-23 DIAGNOSIS — Z803 Family history of malignant neoplasm of breast: Secondary | ICD-10-CM

## 2021-04-06 ENCOUNTER — Ambulatory Visit
Admission: RE | Admit: 2021-04-06 | Discharge: 2021-04-06 | Disposition: A | Discharge status: Home / Self Care | Payer: No Typology Code available for payment source | Source: Ambulatory Visit | Attending: Family Medicine | Admitting: Family Medicine

## 2021-04-06 DIAGNOSIS — R922 Inconclusive mammogram: Secondary | ICD-10-CM

## 2021-04-06 DIAGNOSIS — Z803 Family history of malignant neoplasm of breast: Secondary | ICD-10-CM

## 2021-04-06 MED ORDER — GADOBUTROL 1 MMOL/ML IV SOLN
9.0000 mL | Freq: Once | INTRAVENOUS | Status: AC | PRN
  Administered 2021-04-06: 9 mL via INTRAVENOUS

## 2021-04-07 ENCOUNTER — Ambulatory Visit (INDEPENDENT_AMBULATORY_CARE_PROVIDER_SITE_OTHER): Payer: BC Managed Care – PPO | Admitting: Psychiatry

## 2021-04-07 ENCOUNTER — Other Ambulatory Visit: Payer: Self-pay

## 2021-04-07 DIAGNOSIS — F5104 Psychophysiologic insomnia: Secondary | ICD-10-CM | POA: Diagnosis not present

## 2021-04-07 DIAGNOSIS — F411 Generalized anxiety disorder: Secondary | ICD-10-CM | POA: Diagnosis not present

## 2021-04-07 DIAGNOSIS — F4321 Adjustment disorder with depressed mood: Secondary | ICD-10-CM | POA: Diagnosis not present
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
## 2021-04-19 ENCOUNTER — Ambulatory Visit: Payer: BC Managed Care – PPO | Admitting: Psychiatry

## 2021-04-20 ENCOUNTER — Emergency Department (HOSPITAL_BASED_OUTPATIENT_CLINIC_OR_DEPARTMENT_OTHER): Payer: BC Managed Care – PPO

## 2021-04-20 ENCOUNTER — Encounter (HOSPITAL_BASED_OUTPATIENT_CLINIC_OR_DEPARTMENT_OTHER): Payer: Self-pay

## 2021-04-20 ENCOUNTER — Other Ambulatory Visit (HOSPITAL_BASED_OUTPATIENT_CLINIC_OR_DEPARTMENT_OTHER): Payer: Self-pay

## 2021-04-20 ENCOUNTER — Other Ambulatory Visit: Payer: Self-pay

## 2021-04-20 ENCOUNTER — Emergency Department (HOSPITAL_BASED_OUTPATIENT_CLINIC_OR_DEPARTMENT_OTHER)
Admission: EM | Admit: 2021-04-20 | Discharge: 2021-04-20 | Disposition: A | Discharge status: Home / Self Care | Payer: BC Managed Care – PPO | Attending: Emergency Medicine | Admitting: Emergency Medicine

## 2021-04-20 DIAGNOSIS — Z79899 Other long term (current) drug therapy: Secondary | ICD-10-CM | POA: Diagnosis not present

## 2021-04-20 DIAGNOSIS — R590 Localized enlarged lymph nodes: Secondary | ICD-10-CM | POA: Insufficient documentation

## 2021-04-20 DIAGNOSIS — E860 Dehydration: Secondary | ICD-10-CM | POA: Insufficient documentation

## 2021-04-20 DIAGNOSIS — R000 Tachycardia, unspecified: Secondary | ICD-10-CM | POA: Insufficient documentation

## 2021-04-20 DIAGNOSIS — Z20822 Contact with and (suspected) exposure to covid-19: Secondary | ICD-10-CM | POA: Insufficient documentation

## 2021-04-20 DIAGNOSIS — I10 Essential (primary) hypertension: Secondary | ICD-10-CM | POA: Diagnosis not present

## 2021-04-20 DIAGNOSIS — J029 Acute pharyngitis, unspecified: Secondary | ICD-10-CM | POA: Insufficient documentation

## 2021-04-20 DIAGNOSIS — E876 Hypokalemia: Secondary | ICD-10-CM | POA: Diagnosis not present

## 2021-04-20 HISTORY — DX: Essential (primary) hypertension: I10

## 2021-04-20 LAB — CBC WITH DIFFERENTIAL/PLATELET
Abs Immature Granulocytes: 0.05 10*3/uL (ref 0.00–0.07)
Basophils Absolute: 0.1 10*3/uL (ref 0.0–0.1)
Basophils Relative: 0 %
Eosinophils Absolute: 0.1 10*3/uL (ref 0.0–0.5)
Eosinophils Relative: 1 %
HCT: 38.2 % (ref 36.0–46.0)
Hemoglobin: 12.8 g/dL (ref 12.0–15.0)
Immature Granulocytes: 0 %
Lymphocytes Relative: 8 %
Lymphs Abs: 1.1 10*3/uL (ref 0.7–4.0)
MCH: 32.6 pg (ref 26.0–34.0)
MCHC: 33.5 g/dL (ref 30.0–36.0)
MCV: 97.2 fL (ref 80.0–100.0)
Monocytes Absolute: 0.7 10*3/uL (ref 0.1–1.0)
Monocytes Relative: 6 %
Neutro Abs: 11.4 10*3/uL — ABNORMAL HIGH (ref 1.7–7.7)
Neutrophils Relative %: 85 %
Platelets: 224 10*3/uL (ref 150–400)
RBC: 3.93 MIL/uL (ref 3.87–5.11)
RDW: 13.2 % (ref 11.5–15.5)
WBC: 13.5 10*3/uL — ABNORMAL HIGH (ref 4.0–10.5)
nRBC: 0 % (ref 0.0–0.2)

## 2021-04-20 LAB — BASIC METABOLIC PANEL
Anion gap: 10 (ref 5–15)
BUN: 10 mg/dL (ref 6–20)
CO2: 23 mmol/L (ref 22–32)
Calcium: 9.1 mg/dL (ref 8.9–10.3)
Chloride: 103 mmol/L (ref 98–111)
Creatinine, Ser: 0.83 mg/dL (ref 0.44–1.00)
GFR, Estimated: >60 mL/min (ref >60)
Glucose, Bld: 125 mg/dL — ABNORMAL HIGH (ref 70–99)
Potassium: 2.8 mmol/L — ABNORMAL LOW (ref 3.5–5.1)
Sodium: 136 mmol/L (ref 135–145)

## 2021-04-20 LAB — GROUP A STREP BY PCR: Group A Strep by PCR: NOT DETECTED

## 2021-04-20 LAB — RESP PANEL BY RT-PCR (FLU A&B, COVID) ARPGX2
Influenza A by PCR: NEGATIVE
Influenza B by PCR: NEGATIVE
SARS Coronavirus 2 by RT PCR: NEGATIVE

## 2021-04-20 LAB — MAGNESIUM: Magnesium: 1.6 mg/dL — ABNORMAL LOW (ref 1.7–2.4)

## 2021-04-20 MED ORDER — SODIUM CHLORIDE 0.9 % IV BOLUS
1000.0000 mL | Freq: Once | INTRAVENOUS | Status: AC
  Administered 2021-04-20: 1000 mL via INTRAVENOUS

## 2021-04-20 MED ORDER — PREDNISONE 10 MG PO TABS
ORAL_TABLET | Freq: Every day | ORAL | 0 refills | Status: DC
  Filled 2021-04-20: qty 42, 12d supply, fill #0

## 2021-04-20 MED ORDER — ONDANSETRON 4 MG PO TBDP
4.0000 mg | ORAL_TABLET | Freq: Three times a day (TID) | ORAL | 0 refills | Status: DC | PRN
  Filled 2021-04-20: qty 18, 6d supply, fill #0

## 2021-04-20 MED ORDER — POTASSIUM CHLORIDE 10 MEQ/100ML IV SOLN
10.0000 meq | Freq: Once | INTRAVENOUS | Status: AC
  Administered 2021-04-20: 10 meq via INTRAVENOUS
  Filled 2021-04-20: qty 100

## 2021-04-20 MED ORDER — IOHEXOL 300 MG/ML  SOLN
100.0000 mL | Freq: Once | INTRAMUSCULAR | Status: AC | PRN
  Administered 2021-04-20: 75 mL via INTRAVENOUS

## 2021-04-20 MED ORDER — MAGNESIUM SULFATE 2 GM/50ML IV SOLN
2.0000 g | Freq: Once | INTRAVENOUS | Status: AC
  Administered 2021-04-20: 2 g via INTRAVENOUS
  Filled 2021-04-20: qty 50

## 2021-04-20 MED ORDER — ONDANSETRON HCL 4 MG/2ML IJ SOLN
4.0000 mg | Freq: Once | INTRAMUSCULAR | Status: AC
  Administered 2021-04-20: 4 mg via INTRAVENOUS
  Filled 2021-04-20: qty 2

## 2021-04-20 MED ORDER — KETOROLAC TROMETHAMINE 30 MG/ML IJ SOLN
30.0000 mg | Freq: Once | INTRAMUSCULAR | Status: AC
  Administered 2021-04-20: 30 mg via INTRAVENOUS
  Filled 2021-04-20: qty 1

## 2021-04-20 MED ORDER — MORPHINE SULFATE (PF) 4 MG/ML IV SOLN
4.0000 mg | Freq: Once | INTRAVENOUS | Status: AC
  Administered 2021-04-20: 4 mg via INTRAVENOUS
  Filled 2021-04-20: qty 1

## 2021-04-20 MED ORDER — DOXYCYCLINE HYCLATE 100 MG PO CAPS
100.0000 mg | ORAL_CAPSULE | Freq: Two times a day (BID) | ORAL | 0 refills | Status: DC
  Filled 2021-04-20: qty 14, 7d supply, fill #0

## 2021-04-20 MED ORDER — DEXAMETHASONE SODIUM PHOSPHATE 10 MG/ML IJ SOLN
10.0000 mg | Freq: Once | INTRAMUSCULAR | Status: AC
  Administered 2021-04-20: 10 mg via INTRAVENOUS
  Filled 2021-04-20: qty 1

## 2021-04-20 MED ORDER — HYDROCODONE-ACETAMINOPHEN 5-325 MG PO TABS
1.0000 | ORAL_TABLET | ORAL | 0 refills | Status: DC | PRN
  Filled 2021-04-20: qty 10, 2d supply, fill #0

## 2021-04-20 NOTE — ED Notes (Signed)
Pt ambulatory with steady gait to restroom 

## 2021-04-20 NOTE — ED Provider Notes (Signed)
[facility] EMERGENCY DEPARTMENT Provider Note   CSN: [id number] Arrival date & time: 04/20/21  0809     History Chief Complaint  Patient presents with   Sore Throat   Emesis    Patricia Mclean is a 50 y.o. female.  Pt presents to the ED today with a sore throat and n/v.  Pt said she has had a sore throat for 10 days.  She went to [place] this week and said it was much worse.  She also developed n/v.  She said she feels swollen in her neck and that she can't swallow.  She had had a headache, cough, runny nose.        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     Family History  Problem Relation Age of Onset   Breast cancer Mother    Ovarian cancer Mother    Stroke Mother    Dementia Father    Depression Brother    Anxiety disorder Brother     Social History   Tobacco Use   Smoking status: Never   Smokeless tobacco: Never  Vaping Use   Vaping Use: Never used  Substance Use Topics   Alcohol use: Yes    Alcohol/week: 1.0 standard drink    Types: 1 Glasses of wine per week    Comment: occasional  wine with dinner   Drug use: Never    Home Medications Prior to Admission medications   Medication Sig Start Date End Date Taking? Authorizing Provider  doxycycline (VIBRAMYCIN) 100 MG capsule Take 1 capsule (100 mg total) by mouth 2 (two) times daily.  04/20/21  Yes Jacalyn Lefevre, MD  HYDROcodone-acetaminophen (NORCO/VICODIN) 5-325 MG tablet Take 1 tablet by mouth every 4 (four) hours as needed. Patient not taking: Reported on 04/25/2021 04/20/21  Yes Jacalyn Lefevre, MD  ondansetron (ZOFRAN ODT) 4 MG disintegrating tablet Take 1 tablet (4 mg total) by mouth every 8 (eight) hours as needed for nausea or vomiting. 04/20/21  Yes Jacalyn Lefevre, MD  predniSONE (DELTASONE) 10 MG tablet Take by mouth daily. Take 6 tabs by mouth daily  for 2 days, then 5 tabs for 2 days, then 4 tabs for 2 days, then 3 tabs for 2 days, 2 tabs for 2 days, then 1 tab by mouth daily for 2 days 04/20/21  Yes Jacalyn Lefevre, MD  ALPRAZolam Prudy Feeler) 0.25 MG tablet Take 2-3 tablets at bedtime 05/23/21   Corie Chiquito, PMHNP  busPIRone (BUSPAR) 30 MG tablet Take 1 tablet (30 mg total) by mouth 2 (two) times daily. 04/25/21 07/24/21  Corie Chiquito, PMHNP  chlorthalidone (HYGROTON) 25 MG tablet Take 12.5 mg by mouth every morning. 04/27/20   [provider]  clindamycin (CLEOCIN) 150 MG capsule Take 150 mg by mouth every 6 (six) hours.  Prior to dental work 01/04/20   [provider]  dicyclomine (BENTYL) 10 MG capsule Take by mouth. 05/12/20 06/11/20  [provider]  dicyclomine (BENTYL) 10 MG capsule Take 10 mg by mouth 4 (four) times daily -  before meals and at bedtime. 05/13/20   [provider]  DULoxetine (CYMBALTA) 60 MG capsule Take 1 capsule (60 mg total) by mouth daily. 04/25/21   Corie Chiquito, PMHNP  famotidine (PEPCID) 40 MG tablet Take by mouth. 02/22/20   [provider]  fexofenadine (ALLEGRA) 180 MG tablet Take 180 mg by mouth daily.    [provider]  ibuprofen (ADVIL) 800 MG tablet Take by mouth. 02/04/20   [provider]  Methylcellulose, Laxative, (CITRUCEL) 500 MG TABS Take by mouth.    [provider]  metoprolol tartrate (LOPRESSOR) 50 MG tablet Take 50 mg by mouth 2 (two) times  daily.    [provider]  Multiple Vitamins-Minerals (ONE-A-DAY WOMENS PO) Take by mouth.    [provider]  omeprazole (PRILOSEC) 40 MG capsule Take 40 mg by mouth daily. 05/07/20   [provider]  Potassium Chloride ER 20 MEQ TBCR Take 2 tablets by mouth 2 (two) times daily. 05/02/20   [provider]  RABEprazole (ACIPHEX) 20 MG tablet Take 20 mg by mouth 2 (two) times daily.    [provider]  rosuvastatin (CRESTOR) 20 MG tablet Take 20 mg by mouth daily.    [provider]  sertraline (ZOLOFT) 100 MG tablet Take 1 tablet (100 mg total) by mouth daily. 04/25/21   Corie Chiquito, PMHNP  sucralfate (CARAFATE) 1 g tablet Take by mouth. 02/22/20   [provider]  Topiramate ER (TROKENDI XR) 100 MG CP24 Take 100 mg by mouth 2 (two) times daily.    [provider]  vitamin B-12 (CYANOCOBALAMIN) 1000 MCG tablet Take 1,000 mcg by mouth daily.    [provider]    Allergies    Penicillins, Avelox [moxifloxacin hcl], and Levofloxacin  Review of Systems   Review of Systems  Constitutional:  Positive for fever.  HENT:  Positive for rhinorrhea and sore throat.   Respiratory:  Positive for cough.   All other systems reviewed and are negative.  Physical Exam Updated Vital Signs BP (!) 128/97   Pulse (!) 101   Temp 99.9 F (37.7 C) (Oral)   Resp 13   Ht 5\' 4"  (1.626 m)   Wt 79.4 kg   LMP 03/26/2021   SpO2 95%   BMI 30.04 kg/m   Physical Exam Vitals and nursing note reviewed.  Constitutional:      Appearance: She is well-developed.  HENT:     Head: Normocephalic and atraumatic.     Right Ear: Tympanic membrane and ear canal normal.     Left Ear: Tympanic membrane and ear canal normal.     Mouth/Throat:     Mouth: Mucous membranes are dry.     Pharynx: Posterior oropharyngeal erythema present.  Cardiovascular:     Rate and Rhythm: Regular rhythm. Tachycardia present.  Pulmonary:     Effort:  Pulmonary effort is normal.     Breath sounds: Normal breath sounds.  Abdominal:     General: Bowel sounds are normal.     Palpations: Abdomen is soft.  Lymphadenopathy:     Cervical: Cervical adenopathy present.  Skin:    General: Skin is warm.     Capillary Refill: Capillary refill takes less than 2 seconds.  Neurological:     General: No focal deficit present.     Mental Status: She is alert and oriented to person, place, and time.  Psychiatric:        Mood and Affect: Mood normal.    ED Results / Procedures / Treatments   Labs (all labs ordered are listed, but only abnormal results are displayed) Labs Reviewed  BASIC METABOLIC PANEL - Abnormal; Notable for the following components:      Result Value   Potassium 2.8 (*)    Glucose, Bld 125 (*)    All other components within normal limits  CBC WITH DIFFERENTIAL/PLATELET - Abnormal; Notable for the following components:   WBC 13.5 (*)    Neutro Abs 11.4 (*)    All other components within normal limits  MAGNESIUM - Abnormal; Notable for the following components:   Magnesium 1.6 (*)    All other components within normal limits  GROUP A STREP BY PCR  RESP PANEL BY RT-PCR (FLU A&B, COVID) [id number]    EKG None  Radiology No results found.  Procedures Procedures   Medications Ordered in ED Medications  sodium chloride 0.9 % bolus 1,000 mL ( Intravenous Stopped 04/20/21 0958)  ketorolac (TORADOL) 30 MG/ML injection 30 mg (30 mg Intravenous Given 04/20/21 0906)  ondansetron (ZOFRAN) injection 4 mg (4 mg Intravenous Given 04/20/21 0903)  dexamethasone (DECADRON) injection 10 mg (10 mg Intravenous Given 04/20/21 0905)  potassium chloride 10 mEq in 100 mL IVPB (0 mEq Intravenous Stopped 04/20/21 1149)  sodium chloride 0.9 % bolus 1,000 mL (0 mLs Intravenous Stopped 04/20/21 1215)  iohexol (OMNIPAQUE) 300 MG/ML solution 100 mL (75 mLs Intravenous Contrast Given 04/20/21 1018)  magnesium sulfate IVPB 2 g 50 mL (0 g  Intravenous Stopped 04/20/21 1215)  morphine 4 MG/ML injection 4 mg (4 mg Intravenous Given 04/20/21 1058)    ED Course  I have reviewed the triage vital signs and the nursing notes.  Pertinent labs & imaging results that were available during my care of the patient were reviewed by me and considered in my medical decision making (see chart for details).    MDM Rules/Calculators/A&P                           Covid/flu/strep neg.  CT neck without abscess, but she does have some inflammation, so will be treated with doxy as she is pcn allergic.  Pt is feeling much better after treatment.  K and Mg are low and are replaced.  Pt is stable for d/c.  She is to return if worse.  F/u with pcp.  JOYANN TIVIS was evaluated in Emergency Department on 04/27/2021 for the symptoms described in the history of present illness. She was evaluated in the context of the global COVID-19 pandemic, which necessitated consideration that the patient might be at risk for infection with the SARS-CoV-2 virus that causes COVID-19. Institutional protocols and algorithms that pertain to the evaluation of patients at risk for COVID-19 are in a state of rapid change based on information released by regulatory bodies including the CDC and federal and state organizations. These policies and algorithms were followed during the patient's care in the ED.  Final Clinical Impression(s) / ED Diagnoses Final diagnoses:  Pharyngitis, unspecified etiology  Hypomagnesemia  Hypokalemia  Dehydration    Rx / DC Orders ED Discharge Orders          Ordered    predniSONE (DELTASONE) 10 MG  tablet  Daily        04/20/21 1142    HYDROcodone-acetaminophen (NORCO/VICODIN) 5-325 MG tablet  Every 4 hours PRN        04/20/21 1142    doxycycline (VIBRAMYCIN) 100 MG capsule  2 times daily        04/20/21 1142    ondansetron (ZOFRAN ODT) 4 MG disintegrating tablet  Every 8 hours PRN        04/20/21 1143              Jacalyn Lefevre, MD 04/27/21 [id number]

## 2021-04-20 NOTE — ED Notes (Signed)
Patient transported to CT 

## 2021-04-20 NOTE — ED Triage Notes (Signed)
Pt reports sore throat for 10 days. Throat worsening over the last 24 hours to the point she feels she is having difficulty swallowing. Vomited all day and night . Reports HA, cough, runny nose  and loose stools. Pt crying at this time.

## 2021-04-20 NOTE — ED Notes (Signed)
Spoke with Shay in lab to add on magnesium

## 2021-04-20 NOTE — ED Notes (Signed)
Per EDP order, pt given fluids and/or food for PO challenge. Pt verbalized understanding to utilize call bell if nausea or emesis occur. 

## 2021-04-25 ENCOUNTER — Other Ambulatory Visit: Payer: Self-pay

## 2021-04-25 ENCOUNTER — Encounter: Payer: Self-pay | Admitting: Psychiatry

## 2021-04-25 ENCOUNTER — Ambulatory Visit: Payer: BC Managed Care – PPO | Admitting: Psychiatry

## 2021-04-25 DIAGNOSIS — F411 Generalized anxiety disorder: Secondary | ICD-10-CM

## 2021-04-25 MED ORDER — ALPRAZOLAM 0.25 MG PO TABS: ORAL_TABLET | ORAL | 1 refills | Status: DC

## 2021-04-25 MED ORDER — DULOXETINE HCL 60 MG PO CPEP: 60.0000 mg | ORAL_CAPSULE | Freq: Every day | ORAL | 0 refills | Status: DC

## 2021-04-25 MED ORDER — SERTRALINE HCL 100 MG PO TABS: 100.0000 mg | ORAL_TABLET | Freq: Every day | ORAL | 0 refills | Status: DC

## 2021-04-25 MED ORDER — BUSPIRONE HCL 30 MG PO TABS: 30.0000 mg | ORAL_TABLET | Freq: Two times a day (BID) | ORAL | 0 refills | Status: DC

## 2021-04-25 NOTE — Progress Notes (Signed)
Patricia Mclean [id number] 1971/01/03 50 y.o.  Subjective:   Patient ID:  Patricia Mclean is a 50 y.o. (DOB 1971/06/07) female.  Chief Complaint:  Chief Complaint  Patient presents with   Anxiety   Follow-up    Depression    HPI Patricia Mclean presents to the office today for follow-up of anxiety and depression. She reports that her anxiety has been "better." She notices she is "not obsessing as much." She notices that she has not been as anxious about certain things that she normally would. Notices less sense of impending doom. Reports that she is able to handle  the situation with her father's cognitive issues better. Had a panic attack when she realized she could not swallow due to pharyngitis. Also had a panic attack while flying home and vomiting.   Continues to have some difficulty sleeping, typically with sleep initiation. Sleeping 5-6 hours a night. She will replay things in her mind that happened during the day. She will have some catastrophic thoughts. She reports that she will over-think decisions she made in the past and will ask if she did the wrong thing or could have better made a better choice.   She reports that depression has been "really good." She reports motivation has been good. Energy remains low after recent illness. Concentration is adequate. Appetite has been lower with illness. Enjoying some things more. Denies SI.   She reports that she traveled to [place] and got sick. She then had severe pharyngitis and went to ER due to difficulty swallowing and breathing. . She reports that she is feeling better after starting antibiotics.   Past Psychiatric Medication Trials: Paxil-May have caused weight gain. Limited improvement. Took 20 mg.  Celexa- Denies significant improvement in mood or anxiety. Has taken for about a year.  Topamax-Prescribed for migraines. No improvement on anxiety.  Alprazolam-Helpful for sleep initiation Gabapentin- Prescribed for  post-operative nerve pain. Took briefly. Did not seem to improve anxiety.  Buspar- Ineffective   GAD-7    Flowsheet Row Counselor from 11/23/2020 in [facility]  Total GAD-7 Score 14      PHQ2-9    Flowsheet Row Counselor from 11/23/2020 in [facility]  PHQ-2 Total Score 3  PHQ-9 Total Score 11      Flowsheet Row ED from 04/20/2021 in [facility]  C-SSRS RISK CATEGORY No Risk        Review of Systems:  Review of Systems  HENT:  Positive for congestion, postnasal drip, sore throat and voice change.   Gastrointestinal: Negative.   Musculoskeletal:  Negative for gait problem.  Psychiatric/Behavioral:         Please refer to HPI   Medications: I have reviewed the patient's current medications.  Current Outpatient Medications  Medication Sig Dispense Refill   chlorthalidone (HYGROTON) 25 MG tablet Take 12.5 mg by mouth every morning.     doxycycline (VIBRAMYCIN) 100 MG capsule Take 1 capsule (100 mg total) by mouth 2 (two) times daily. 14 capsule 0   famotidine (PEPCID) 40 MG tablet Take by mouth.     fexofenadine (ALLEGRA) 180 MG tablet Take 180 mg by mouth daily.     ibuprofen (ADVIL) 800 MG tablet Take by mouth.     Methylcellulose, Laxative, (CITRUCEL) 500 MG TABS Take by mouth.     metoprolol tartrate (LOPRESSOR) 50 MG tablet Take 50 mg by mouth 2 (two) times daily.     Multiple Vitamins-Minerals (ONE-A-DAY WOMENS PO) Take  by mouth.     omeprazole (PRILOSEC) 40 MG capsule Take 40 mg by mouth daily.     ondansetron (ZOFRAN ODT) 4 MG disintegrating tablet Take 1 tablet (4 mg total) by mouth every 8 (eight) hours as needed for nausea or vomiting. 20 tablet 0   Potassium Chloride ER 20 MEQ TBCR Take 2 tablets by mouth 2 (two) times daily.     predniSONE (DELTASONE) 10 MG tablet Take by mouth daily. Take 6 tabs by mouth daily  for 2 days, then 5 tabs for 2 days, then 4 tabs for 2 days, then 3 tabs for 2 days, 2  tabs for 2 days, then 1 tab by mouth daily for 2 days 42 tablet 0   RABEprazole (ACIPHEX) 20 MG tablet Take 20 mg by mouth 2 (two) times daily.     rosuvastatin (CRESTOR) 20 MG tablet Take 20 mg by mouth daily.     sucralfate (CARAFATE) 1 g tablet Take by mouth.     Topiramate ER (TROKENDI XR) 100 MG CP24 Take 100 mg by mouth 2 (two) times daily.     vitamin B-12 (CYANOCOBALAMIN) 1000 MCG tablet Take 1,000 mcg by mouth daily.     [START ON 05/23/2021] ALPRAZolam (XANAX) 0.25 MG tablet Take 2-3 tablets at bedtime 90 tablet 1   busPIRone (BUSPAR) 30 MG tablet Take 1 tablet (30 mg total) by mouth 2 (two) times daily. 180 tablet 0   clindamycin (CLEOCIN) 150 MG capsule Take 150 mg by mouth every 6 (six) hours. Prior to dental work     dicyclomine (BENTYL) 10 MG capsule Take by mouth.     dicyclomine (BENTYL) 10 MG capsule Take 10 mg by mouth 4 (four) times daily -  before meals and at bedtime.     DULoxetine (CYMBALTA) 60 MG capsule Take 1 capsule (60 mg total) by mouth daily. 90 capsule 0   HYDROcodone-acetaminophen (NORCO/VICODIN) 5-325 MG tablet Take 1 tablet by mouth every 4 (four) hours as needed. (Patient not taking: Reported on 04/25/2021) 10 tablet 0   sertraline (ZOLOFT) 100 MG tablet Take 1 tablet (100 mg total) by mouth daily. 90 tablet 0   No current facility-administered medications for this visit.    Medication Side Effects: None  Allergies:  Allergies  Allergen Reactions   Penicillins Anaphylaxis and Shortness Of Breath    Did it involve swelling of the face/tongue/throat, SOB, or low BP? Yes Did it involve sudden or severe rash/hives, skin peeling, or any reaction on the inside of your mouth or nose? No Did you need to seek medical attention at a hospital or doctor's office? Yes When did it last happen?      30+ years If all above answers are "NO", may proceed with cephalosporin use.    Avelox [Moxifloxacin Hcl] Hives   Levofloxacin Hives    Past Medical History:   Diagnosis Date   Anxiety    GERD (gastroesophageal reflux disease)    Headache    metoprolol and topamx   Hypertension    Pneumonia    x3 had vaccine    Past Medical History, Surgical history, Social history, and Family history were reviewed and updated as appropriate.   Please see review of systems for further details on the patient's review from today.   Objective:   Physical Exam:  LMP 03/26/2021   Physical Exam Constitutional:      General: She is not in acute distress. Musculoskeletal:        General: No  deformity.  Neurological:     Mental Status: She is alert and oriented to person, place, and time.     Coordination: Coordination normal.  Psychiatric:        Attention and Perception: Attention and perception normal. She does not perceive auditory or visual hallucinations.        Mood and Affect: Mood is anxious. Mood is not depressed. Affect is not labile, blunt, angry or inappropriate.        Speech: Speech normal.        Behavior: Behavior normal.        Thought Content: Thought content normal. Thought content is not paranoid or delusional. Thought content does not include homicidal or suicidal ideation. Thought content does not include homicidal or suicidal plan.        Cognition and Memory: Cognition and memory normal.        Judgment: Judgment normal.     Comments: Insight intact    Lab Review:     Component Value Date/Time   NA 136 04/20/2021 0848   K 2.8 (L) 04/20/2021 0848   CL 103 04/20/2021 0848   CO2 23 04/20/2021 0848   GLUCOSE 125 (H) 04/20/2021 0848   BUN 10 04/20/2021 0848   CREATININE 0.83 04/20/2021 0848   CALCIUM 9.1 04/20/2021 0848   PROT 7.5 08/21/2018 1503   ALBUMIN 4.4 08/21/2018 1503   AST 24 08/21/2018 1503   ALT 26 08/21/2018 1503   ALKPHOS 124 08/21/2018 1503   BILITOT 0.6 08/21/2018 1503   GFRNONAA >60 04/20/2021 0848   GFRAA >60 08/21/2018 1503       Component Value Date/Time   WBC 13.5 (H) 04/20/2021 0848   RBC 3.93  04/20/2021 0848   HGB 12.8 04/20/2021 0848   HCT 38.2 04/20/2021 0848   PLT 224 04/20/2021 0848   MCV 97.2 04/20/2021 0848   MCH 32.6 04/20/2021 0848   MCHC 33.5 04/20/2021 0848   RDW 13.2 04/20/2021 0848   LYMPHSABS 1.1 04/20/2021 0848   MONOABS 0.7 04/20/2021 0848   EOSABS 0.1 04/20/2021 0848   BASOSABS 0.1 04/20/2021 0848    No results found for: POCLITH, LITHIUM   No results found for: PHENYTOIN, PHENOBARB, VALPROATE, CBMZ   .res Assessment: Plan:   Pt seen for 30 minutes and time spent staffing case with Dr. Jennelle Human. Discussed potential benefits, risk,s and side effects of increasing Sertraline to 100 mg po qd for anxiety. Pt agrees to increase in Sertraline to 100 mg po qd.  Continue Cymbalta 60 mg po qd for anxiety and depression.  Continue Buspar 30 mg po BID for anxiety.  Continue Alprazolam 0.25 mg 2-3 tabs po QHS for insomnia and anxiety.  Recommend continuing therapy.  Pt to follow-up in 2 months or sooner if clinically indicated.  Patient advised to contact office with any questions, adverse effects, or acute worsening in signs and symptoms.   Alayshia was seen today for anxiety and follow-up.  Diagnoses and all orders for this visit:  Generalized anxiety disorder -     sertraline (ZOLOFT) 100 MG tablet; Take 1 tablet (100 mg total) by mouth daily. -     busPIRone (BUSPAR) 30 MG tablet; Take 1 tablet (30 mg total) by mouth 2 (two) times daily. -     ALPRAZolam (XANAX) 0.25 MG tablet; Take 2-3 tablets at bedtime -     DULoxetine (CYMBALTA) 60 MG capsule; Take 1 capsule (60 mg total) by mouth daily.    Please see After Visit  Summary for patient specific instructions.  Future Appointments  Date Time Provider Department Center  05/09/2021 11:00 AM Robley Fries, PhD [facility] None  05/23/2021 11:00 AM Robley Fries, PhD [facility] None  06/14/2021 11:00 AM Robley Fries, PhD [facility] None  06/20/2021  9:00 AM Corie Chiquito, PMHNP CP-[facility] None  06/28/2021 11:00 AM  Robley Fries, PhD [facility] None  07/12/2021 11:00 AM Robley Fries, PhD [facility] None    No orders of the defined types were placed in this encounter.   -------------------------------

## 2021-05-09 ENCOUNTER — Other Ambulatory Visit: Payer: Self-pay

## 2021-05-09 ENCOUNTER — Ambulatory Visit (INDEPENDENT_AMBULATORY_CARE_PROVIDER_SITE_OTHER): Payer: BC Managed Care – PPO | Admitting: Psychiatry

## 2021-05-09 DIAGNOSIS — F411 Generalized anxiety disorder: Secondary | ICD-10-CM | POA: Diagnosis not present

## 2021-05-09 DIAGNOSIS — F40298 Other specified phobia: Secondary | ICD-10-CM | POA: Diagnosis not present

## 2021-05-09 DIAGNOSIS — F4321 Adjustment disorder with depressed mood: Secondary | ICD-10-CM | POA: Diagnosis not present

## 2021-05-09 NOTE — Progress Notes (Signed)
Psychotherapy Progress Note [facility] Marliss Czar, PhD LP  Patient ID: Patricia Mclean)    MRN: [id number] Therapy format: Individual psychotherapy Date: 05/09/2021      Start: 11:16a     Stop: 12:03p     Time Spent: 47 min Location: In-person   Session narrative (presenting needs, interim history, self-report of stressors and symptoms, applications of prior therapy, status changes, and interventions made in session) TG in [place] last week, dog tolerated her first road trip well.  This coming weekend will travel with [place] to [place]  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  Discussed concerns with seeming to have given up on her career, how she might fairly respond to others.  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  Re. fears of being caught short on emergency medical care, has done her due diligence, has a connection there in [place]  Has trustworthy help lined up for taking care of the dogs.  Time planning in the works.  Confirmed she is going, not second-guessing.    Therapeutic modalities: Cognitive Behavioral Therapy and Solution-Oriented/Positive Psychology  Mental Status/Observations:  Appearance:   Casual     Behavior:  Appropriate  Motor:  Normal  Speech/Language:   Clear and Coherent  Affect:  Appropriate  Mood:  anxious  Thought process:  normal   Thought content:    WNL  Sensory/Perceptual disturbances:    WNL  Orientation:  Fully oriented  Attention:  Good    Concentration:  Good  Memory:  WNL  Insight:    Good  Judgment:   Good  Impulse Control:  Good   Risk Assessment: Danger to Self: No Self-injurious Behavior: No Danger to Others: No Physical Aggression / Violence: No Duty to Warn: No Access to Firearms a concern: No  Assessment of progress:  progressing  Diagnosis:   ICD-10-CM   1. Generalized anxiety disorder  F41.1     2. 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 Already scheduled visit in this office 05/23/2021.  Robley Fries, PhD Marliss Czar, PhD LP Clinical Psychologist, [facility] [address] [place], [place] [address] [phone]

## 2021-05-23 ENCOUNTER — Ambulatory Visit (INDEPENDENT_AMBULATORY_CARE_PROVIDER_SITE_OTHER): Payer: BC Managed Care – PPO | Admitting: Psychiatry

## 2021-05-23 ENCOUNTER — Other Ambulatory Visit: Payer: Self-pay

## 2021-05-23 DIAGNOSIS — F4321 Adjustment disorder with depressed mood: Secondary | ICD-10-CM | POA: Diagnosis not present

## 2021-05-23 DIAGNOSIS — Z638 Other specified problems related to primary support group: Secondary | ICD-10-CM

## 2021-05-23 DIAGNOSIS — F411 Generalized anxiety disorder: Secondary | ICD-10-CM | POA: Diagnosis not present

## 2021-05-23 DIAGNOSIS — F40298 Other specified phobia: Secondary | ICD-10-CM
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
## 2021-05-29 ENCOUNTER — Telehealth: Payer: Self-pay | Admitting: Psychiatry

## 2021-05-29 NOTE — Telephone Encounter (Signed)
Pt received a 90 day supply and should not run out

## 2021-05-29 NOTE — Telephone Encounter (Signed)
Pt called reporting Rx for Buspar 30 mg 2/d will run out 12/26 and apt is 1/10. Pt reported, increase is working well. Please sent Rx  to [facility].

## 2021-06-14 ENCOUNTER — Ambulatory Visit (INDEPENDENT_AMBULATORY_CARE_PROVIDER_SITE_OTHER): Payer: BC Managed Care – PPO | Admitting: Psychiatry

## 2021-06-14 ENCOUNTER — Other Ambulatory Visit: Payer: Self-pay

## 2021-06-14 DIAGNOSIS — Z636 Dependent relative needing care at home: Secondary | ICD-10-CM | POA: Diagnosis not present

## 2021-06-14 DIAGNOSIS — F411 Generalized anxiety disorder: Secondary | ICD-10-CM | POA: Diagnosis not present

## 2021-06-14 NOTE — Progress Notes (Signed)
Psychotherapy Progress Note [facility] Marliss Czar, PhD LP  Patient ID: Patricia Mclean)    MRN: [id number] Therapy format: Individual psychotherapy Date: 06/14/2021      Start: 11:28a     Stop: 12:15p     Time Spent: 47 min Location: In-person   Session narrative (presenting needs, interim history, self-report of stressors and symptoms, applications of prior therapy, status changes, and interventions made in session) Piled on family health problems through the holiday season, feeling rocked.  Stayed in town Christmas Day, visited extended relatives.  Brother Peter's MIL Gigi, 32, who has been a foster grandmother to her, took a fall and broke her hip Xmas Day, hospitalized, and going though postsurgical delirium.  Jan 2 call from [place], [name]' father had massive heart attack, serious blockages, two stents, and imaging tomorrow to gauge function.  Preliminary prediction that he probably has 20-60% functioning.  Seem to be facing the prospect of being pulled in different directions for family support, and overhearing some infighting between the 3 brothers in [name]'s family ([name] the [place], [place] the eldest and in country, [place] the reputedly very Psychologist, prison and probation services, in [place]).  Hx of [name]'s father going through shock when widowed and abandoned the house for 4 years after his wife died there 2017-05-29.  Intrigue with brothers navigating different perspectives on loyalty and help.  Personally, tends to want to "hover" with [place] about it, but he doesn't like that attention.  Reframed her out-of-character restraint and quiet presence the most loving gift she could give, taking [name]'s complaints and turning them into wishes fulfilled.    Therapeutic modalities: Cognitive Behavioral Therapy and Solution-Oriented/Positive Psychology  Mental Status/Observations:  Appearance:   Casual     Behavior:  Appropriate  Motor:  Normal  Speech/Language:   Clear and Coherent  Affect:   Appropriate  Mood:  anxious  Thought process:  normal  Thought content:    WNL  Sensory/Perceptual disturbances:    WNL  Orientation:  Fully oriented  Attention:  Good    Concentration:  Good  Memory:  WNL  Insight:    Good  Judgment:   Good  Impulse Control:  Good   Risk Assessment: Danger to Self: No Self-injurious Behavior: No Danger to Others: No Physical Aggression / Violence: No Duty to Warn: No Access to Firearms a concern: No  Assessment of progress:  progressing  Diagnosis:   ICD-10-CM   1. Generalized anxiety disorder  F41.1     2. Caregiver stress  Z63.6      Plan:  Continue as previously Other recommendations/advice as may be noted above Continue to utilize previously learned skills ad lib Maintain medication as prescribed and work faithfully with relevant prescriber(s) if any changes are desired or seem indicated Call the clinic on-call service, 988/hotline, 911, or present to [facility] or ER if any life-threatening psychiatric crisis Return for session(s) already scheduled. Already scheduled visit in this office 06/20/2021.  Robley Fries, PhD Marliss Czar, PhD LP Clinical Psychologist, [facility] [address], [address] [place], [place] [address] [phone]

## 2021-06-20 ENCOUNTER — Other Ambulatory Visit: Payer: Self-pay

## 2021-06-20 ENCOUNTER — Ambulatory Visit (INDEPENDENT_AMBULATORY_CARE_PROVIDER_SITE_OTHER): Payer: BC Managed Care – PPO | Admitting: Psychiatry

## 2021-06-20 ENCOUNTER — Encounter: Payer: Self-pay | Admitting: Psychiatry

## 2021-06-20 DIAGNOSIS — F411 Generalized anxiety disorder: Secondary | ICD-10-CM

## 2021-06-20 MED ORDER — SERTRALINE HCL 100 MG PO TABS: 100.0000 mg | ORAL_TABLET | Freq: Every day | ORAL | 0 refills | Status: DC

## 2021-06-20 MED ORDER — BUSPIRONE HCL 30 MG PO TABS: 30.0000 mg | ORAL_TABLET | Freq: Two times a day (BID) | ORAL | 0 refills | Status: DC

## 2021-06-20 MED ORDER — DULOXETINE HCL 60 MG PO CPEP: 60.0000 mg | ORAL_CAPSULE | Freq: Every day | ORAL | 0 refills | Status: DC

## 2021-06-20 NOTE — Progress Notes (Signed)
SKYELYNN RAMBEAU [id number] 10-29-1970 51 y.o.  Subjective:   Patient ID:  Patricia Mclean is a 51 y.o. (DOB [birth year]) female.  Chief Complaint:  Chief Complaint  Patient presents with   Follow-up    Anxiety, depression    HPI ADRIANA QUINBY presents to the office today for follow-up of depression and anxiety. Father-in-law had an MI in [place] on January 2. She may need to go to [place] to assist him. She reports that she feels like she has handled this stressor better than she would have in the past and has noticed less catastrophizing. She and her husband were surprised that she did not have a panic attack when receiving this news. She reports that she is open to traveling there and would not have traveled a few months ago.   She reports anxiety is improved with Sertraline 100 mg po qd. She reports, "I think I will always be that worrier." She reports that anxiety is now consistent with baseline before exacerbation of anxiety and depression. She reports that she has severe anxiety when dealing with father who has dementia and will call and ask for help, but is unable to articulate what he needs. She feels anxious when she cannot "cheer him up." She reports that this can trigger panic. She reports that panic attacks has significantly decreased. She reports some occ rumination about her father.   Denies depressed mood. She reports energy and motivation have been good. Walking with her dogs. Enjoying things, to include time wit friends. Looking forward to friend visiting later this month. She reports that she has difficulty sleeping a couple nights a week after father has called. She reports difficulty falling asleep until 2 am after those calls with her father and will sleep until 7 am. She reports that otherwise she typically falls asleep at 11 pm and sleeps until 7 am. Appetite has been good. Concentration has been adequate. Denies SI.   She reports that she and her brother are  close.   Take Xanax prn  Past Psychiatric Medication Trials: Paxil-May have caused weight gain. Limited improvement. Took 20 mg.  Celexa- Denies significant improvement in mood or anxiety. Has taken for about a year.  Topamax-Prescribed for migraines. No improvement on anxiety.  Alprazolam-Helpful for sleep initiation Gabapentin- Prescribed for post-operative nerve pain. Took briefly. Did not seem to improve anxiety.  Buspar- Ineffective  GAD-7    Flowsheet Row Counselor from 11/23/2020 in [facility]  Total GAD-7 Score 14      PHQ2-9    Flowsheet Row Counselor from 11/23/2020 in [facility]  PHQ-2 Total Score 3  PHQ-9 Total Score 11      Flowsheet Row ED from 04/20/2021 in [facility]  C-SSRS RISK CATEGORY No Risk        Review of Systems:  Review of Systems  Gastrointestinal: Negative.   Musculoskeletal:  Negative for gait problem.  Neurological:  Positive for headaches.       HA from teeth grinding  Psychiatric/Behavioral:         Please refer to HPI   Medications: I have reviewed the patient's current medications.  Current Outpatient Medications  Medication Sig Dispense Refill   ALPRAZolam (XANAX) 0.25 MG tablet Take 2-3 tablets at bedtime 90 tablet 1   chlorthalidone (HYGROTON) 25 MG tablet Take 12.5 mg by mouth every morning.     famotidine (PEPCID) 40 MG tablet Take by mouth.     fexofenadine (ALLEGRA) 180  MG tablet Take 180 mg by mouth daily.     ibuprofen (ADVIL) 800 MG tablet Take by mouth every 6 (six) hours as needed for cramping.     Methylcellulose, Laxative, (CITRUCEL) 500 MG TABS Take by mouth.     metoprolol tartrate (LOPRESSOR) 50 MG tablet Take 50 mg by mouth 2 (two) times daily.     Multiple Vitamins-Minerals (ONE-A-DAY WOMENS PO) Take by mouth.     omeprazole (PRILOSEC) 40 MG capsule Take 40 mg by mouth daily.     ondansetron (ZOFRAN ODT) 4 MG disintegrating tablet Take 1 tablet  (4 mg total) by mouth every 8 (eight) hours as needed for nausea or vomiting. 20 tablet 0   Potassium Chloride ER 20 MEQ TBCR Take 2 tablets by mouth 2 (two) times daily.     rosuvastatin (CRESTOR) 20 MG tablet Take 20 mg by mouth daily.     sucralfate (CARAFATE) 1 g tablet Take by mouth.     Topiramate ER (TROKENDI XR) 100 MG CP24 Take 100 mg by mouth 2 (two) times daily.     vitamin B-12 (CYANOCOBALAMIN) 1000 MCG tablet Take 1,000 mcg by mouth daily.     busPIRone (BUSPAR) 30 MG tablet Take 1 tablet (30 mg total) by mouth 2 (two) times daily. 180 tablet 0   clindamycin (CLEOCIN) 150 MG capsule Take 150 mg by mouth every 6 (six) hours. Prior to dental work     DULoxetine (CYMBALTA) 60 MG capsule Take 1 capsule (60 mg total) by mouth daily. 90 capsule 0   HYDROcodone-acetaminophen (NORCO/VICODIN) 5-325 MG tablet Take 1 tablet by mouth every 4 (four) hours as needed. (Patient not taking: Reported on 04/25/2021) 10 tablet 0   sertraline (ZOLOFT) 100 MG tablet Take 1 tablet (100 mg total) by mouth daily. 90 tablet 0   No current facility-administered medications for this visit.    Medication Side Effects: None  Allergies:  Allergies  Allergen Reactions   Penicillins Anaphylaxis and Shortness Of Breath    Did it involve swelling of the face/tongue/throat, SOB, or low BP? Yes Did it involve sudden or severe rash/hives, skin peeling, or any reaction on the inside of your mouth or nose? No Did you need to seek medical attention at a hospital or doctor's office? Yes When did it last happen?      30+ years If all above answers are NO, may proceed with cephalosporin use.    Avelox [Moxifloxacin Hcl] Hives   Levofloxacin Hives    Past Medical History:  Diagnosis Date   Anxiety    GERD (gastroesophageal reflux disease)    Headache    metoprolol and topamx   Hypertension    Pneumonia    x3 had vaccine    Past Medical History, Surgical history, Social history, and Family history were  reviewed and updated as appropriate.   Please see review of systems for further details on the patient's review from today.   Objective:   Physical Exam:  There were no vitals taken for this visit.  Physical Exam Constitutional:      General: She is not in acute distress. Musculoskeletal:        General: No deformity.  Neurological:     Mental Status: She is alert and oriented to person, place, and time.     Coordination: Coordination normal.  Psychiatric:        Attention and Perception: Attention and perception normal. She does not perceive auditory or visual hallucinations.  Mood and Affect: Mood is not depressed. Affect is not labile, blunt, angry or inappropriate.        Speech: Speech normal.        Behavior: Behavior normal.        Thought Content: Thought content normal. Thought content is not paranoid or delusional. Thought content does not include homicidal or suicidal ideation. Thought content does not include homicidal or suicidal plan.        Cognition and Memory: Cognition and memory normal.        Judgment: Judgment normal.     Comments: Insight intact Mood presents as less anxious    Lab Review:     Component Value Date/Time   NA 136 04/20/2021 0848   K 2.8 (L) 04/20/2021 0848   CL 103 04/20/2021 0848   CO2 23 04/20/2021 0848   GLUCOSE 125 (H) 04/20/2021 0848   BUN 10 04/20/2021 0848   CREATININE 0.83 04/20/2021 0848   CALCIUM 9.1 04/20/2021 0848   PROT 7.5 08/21/2018 1503   ALBUMIN 4.4 08/21/2018 1503   AST 24 08/21/2018 1503   ALT 26 08/21/2018 1503   ALKPHOS 124 08/21/2018 1503   BILITOT 0.6 08/21/2018 1503   GFRNONAA >60 04/20/2021 0848   GFRAA >60 08/21/2018 1503       Component Value Date/Time   WBC 13.5 (H) 04/20/2021 0848   RBC 3.93 04/20/2021 0848   HGB 12.8 04/20/2021 0848   HCT 38.2 04/20/2021 0848   PLT 224 04/20/2021 0848   MCV 97.2 04/20/2021 0848   MCH 32.6 04/20/2021 0848   MCHC 33.5 04/20/2021 0848   RDW 13.2  04/20/2021 0848   LYMPHSABS 1.1 04/20/2021 0848   MONOABS 0.7 04/20/2021 0848   EOSABS 0.1 04/20/2021 0848   BASOSABS 0.1 04/20/2021 0848    No results found for: POCLITH, LITHIUM   No results found for: PHENYTOIN, PHENOBARB, VALPROATE, CBMZ   .res Assessment: Plan:   Pt seen for 30 minutes and time spent reviewing response to treatment and evaluating treatment plan. She reports that anxiety symptoms are currently manageable and would like to continue current medications without changes.  Continue Sertraline 100 mg po qd for anxiety.  Continue Cymbalta 60 mg po qd for depression.  Continue Buspar 30 mg po BID for anxiety.  Continue Alprazolam 0.25 mg 2-3 tabs at bedtime for anxiety and insomnia.  Pt to follow-up in 3 months or sooner if clinically indicated.  Patient advised to contact office with any questions, adverse effects, or acute worsening in signs and symptoms.   Judeth CornfieldStephanie was seen today for follow-up.  Diagnoses and all orders for this visit:  Generalized anxiety disorder -     DULoxetine (CYMBALTA) 60 MG capsule; Take 1 capsule (60 mg total) by mouth daily. -     sertraline (ZOLOFT) 100 MG tablet; Take 1 tablet (100 mg total) by mouth daily. -     busPIRone (BUSPAR) 30 MG tablet; Take 1 tablet (30 mg total) by mouth 2 (two) times daily.     Please see After Visit Summary for patient specific instructions.  Future Appointments  Date Time Provider Department Center  07/12/2021 11:00 AM Robley FriesMitchum, Robert, PhD CP-CP None  08/01/2021 11:00 AM Robley FriesMitchum, Robert, PhD CP-[facility] None  08/15/2021 11:00 AM Robley FriesMitchum, Robert, PhD [facility] None  08/29/2021 11:00 AM Robley FriesMitchum, Robert, PhD CP-[facility] None  09/12/2021 11:00 AM Robley FriesMitchum, Robert, PhD CP-CP None  09/14/2021 10:30 AM Corie Chiquitoarter, Johari Bennetts, PMHNP CP-CP None  09/26/2021 11:00 AM Robley FriesMitchum, Robert, PhD CP-CP  None    No orders of the defined types were placed in this encounter.   -------------------------------

## 2021-06-28 ENCOUNTER — Ambulatory Visit: Payer: BC Managed Care – PPO | Admitting: Psychiatry

## 2021-06-28 ENCOUNTER — Ambulatory Visit: Payer: BC Managed Care – PPO | Admitting: Physician Assistant

## 2021-06-28 ENCOUNTER — Encounter: Payer: Self-pay | Admitting: Physician Assistant

## 2021-06-28 DIAGNOSIS — M7711 Lateral epicondylitis, right elbow: Secondary | ICD-10-CM | POA: Diagnosis not present

## 2021-06-28 NOTE — Progress Notes (Signed)
HPI: Patricia Mclean comes in today with right elbow pain.  She has been seen by Dr. Magnus Mclean last summer for right elbow lateral epicondylitis.  She states symptoms returned about 2 months ago.  Sore to touch.  She has throbbing at night.  States most of the time pain is 5 out of 10 and constant pain but at worst can become 9 out of 10 pain.  She states the injection did help last time.  She is nondiabetic.  She has had no fevers or chills.  Had a recent COVID-vaccine this past Monday.  She has pain when performing turning motions her lifting.  She has tried ibuprofen and home exercise program and ice.  Review of systems see HPI otherwise negative.  Right elbow: Good range of motion the elbow.  No rashes skin lesions ulcerations ecchymosis or erythema over the right lateral elbow.  She is tender over the right lateral epicondyle region.  Impression: Right elbow lateral epicondylitis.  Plan: Spoke with patient at length today about treatments including repeat cortisone injection however with her recent COVID vaccine would not recommend this for 10 to 14 days post vaccine.  Spoke with her about deep tissue massage she will try to find a massage therapist that can perform this.  She will apply Voltaren gel 2 g 4 times daily to the elbow.  Lifting techniques discussed with her to decrease the strain on the elbow.  Questions were encouraged and answered at length.  She follow-up with [place] as needed pain persist or becomes worse.

## 2021-07-12 ENCOUNTER — Ambulatory Visit (INDEPENDENT_AMBULATORY_CARE_PROVIDER_SITE_OTHER): Payer: BC Managed Care – PPO | Admitting: Psychiatry

## 2021-07-12 ENCOUNTER — Other Ambulatory Visit: Payer: Self-pay

## 2021-07-12 DIAGNOSIS — Z638 Other specified problems related to primary support group: Secondary | ICD-10-CM

## 2021-07-12 DIAGNOSIS — Z636 Dependent relative needing care at home: Secondary | ICD-10-CM | POA: Diagnosis not present

## 2021-07-12 DIAGNOSIS — F411 Generalized anxiety disorder: Secondary | ICD-10-CM | POA: Diagnosis not present

## 2021-07-12 NOTE — Progress Notes (Signed)
Psychotherapy Progress Note [facility] [name], PhD LP  Patient ID: Patricia Mclean)    MRN: [id number] Therapy format: Individual psychotherapy Date: 07/12/2021      Start: 11:19a     Stop: 12:06p     Time Spent: 47 min Location: In-person   Session narrative (presenting needs, interim history, self-report of stressors and symptoms, applications of prior therapy, status changes, and interventions made in session) 4 weeks since last seen, struggling right now with Patricia Mclean's father situation.  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  The three brothers are entangled in having to tell their father he can't go home (rural, unsupervised home), and Patricia Mclean and the other two wives have been involved in locating facilities, discovering the [place] state facility, agreeing to split the cost of private facility on [place].  Their father is intelligent,cogent, but very much in denial and stuck on having his independence, but the boys eventually banded together to get him to rehab facility.  Seemed to resolve, but father's behavior has been horrible, with racist diatribes against nursing staff and arrogant howling about himself as the one paying, and as the fragile patient, and blaming staff for upsetting him.  Now three weeks worth of bad behavior, enough to have the facility convening about possibly kicking dad out.    Patricia Mclean -- her emotional rock -- feels downcast, disgusted, having repeat illnesses, and getting cranky, and seeing him so distressed has her too anxious to sleep well.  She still tends to hover when he is sick, which annoys him, though she is trying to modify.  Discussed possible ways to help Home without hovering.  Therapeutic modalities: Cognitive Behavioral Therapy and Solution-Oriented/Positive Psychology  Mental  Status/Observations:  Appearance:   Casual     Behavior:  Appropriate  Motor:  Normal  Speech/Language:   Clear and Coherent  Affect:  Appropriate  Mood:  anxious  Thought process:  normal  Thought content:    WNL  Sensory/Perceptual disturbances:    WNL  Orientation:  Fully oriented  Attention:  Good    Concentration:  Good  Memory:  WNL  Insight:    Good  Judgment:   Good  Impulse Control:  Good   Risk Assessment: Danger to Self: No Self-injurious Behavior: No Danger to Others: No Physical Aggression / Violence: No Duty to Warn: No Access to Firearms a concern: No  Assessment of progress:  stabilized  Diagnosis:   ICD-10-CM   1. Generalized anxiety disorder  F41.1     2. Caregiver stress  Z63.6     3. Relationship problem with family members  Z63.8      Plan:  Offer of friendly advice to Patricia Mclean, to imagine how his brother Patricia Mclean is speaking for all three brothers when he can't just be there to witness.  Imagine Patricia Mclean succeeding. Continue assessing Patricia Mclean's boundaries before offering too much comfort or advice -- just ask if it's time he would like, let him choose, and remember letting him choose is taking care of him. Other recommendations/advice as may be noted above Continue to utilize previously learned skills ad lib Maintain medication as prescribed and work faithfully with relevant prescriber(s) if any changes are desired or seem indicated Call the clinic on-call service, 988/hotline, 911, or present to [facility] or ER if any life-threatening psychiatric crisis Return for session(s) already scheduled. Already  scheduled visit in this office 08/01/2021.  [name], PhD [name], PhD LP Clinical Psychologist, [facility] [address], [place], [place] [address] [phone]

## 2021-08-01 ENCOUNTER — Ambulatory Visit: Payer: BC Managed Care – PPO | Admitting: Psychiatry

## 2021-08-15 ENCOUNTER — Other Ambulatory Visit: Payer: Self-pay

## 2021-08-15 ENCOUNTER — Ambulatory Visit (INDEPENDENT_AMBULATORY_CARE_PROVIDER_SITE_OTHER): Payer: BC Managed Care – PPO | Admitting: Psychiatry

## 2021-08-15 DIAGNOSIS — Z638 Other specified problems related to primary support group: Secondary | ICD-10-CM

## 2021-08-15 DIAGNOSIS — F4321 Adjustment disorder with depressed mood: Secondary | ICD-10-CM

## 2021-08-15 DIAGNOSIS — F411 Generalized anxiety disorder: Secondary | ICD-10-CM

## 2021-08-15 NOTE — Progress Notes (Signed)
Psychotherapy Progress Note ?[facility] ?Marliss Czar, PhD LP ? ?Patient ID: Patricia Mclean)    MRN: [id number] ?Therapy format: Individual psychotherapy ?Date: 08/15/2021      Start: 11:20a     Stop: 12:10p     Time Spent: 50 min ?Location: In-person  ? ?Session narrative (presenting needs, interim history, self-report of stressors and symptoms, applications of prior therapy, status changes, and interventions made in session) ?Amused to see [employer] written up in [place] ("When [employer] Resort") taking president Patricia Mclean to task for ego, expensive/empty service to images more than education.  Vindicating, though still sad, given her historical devotion to higher ed, honestly healthy student life, and untold tales of emergency management and crimes addressed.  Felt momentarily back at [facility] overhearing a young patient up front answering the graceful question "How are you?" With a frank admission that he is hung over. ? ?In family stresses, Patricia Mclean's father checked himself out AMA yesterday, set himself back up in apartment with cardiac problems that he cannot adequately care for by himself, without home care.  Hard on Patricia Mclean, still the peacemaker between his brothers, still stressed herself being unable to make a crucial difference in the situation.  Challenged to consider whether Patricia Mclean himself is more philosophical about it than he appears.  Traveling themselves April 3, apprehensive what they might find when they go.  Probed underlying fears, any imagined disaster scenarios, and what to figure is being done to address them if they come true (heal the image of worry).  Mostly, it's the strong feelings of empathy she has knowing about the prospects of needless suffering, family estrangement (Patricia Mclean's brothers already at odds), and to some extent Patricia Mclean's health trying to broker.  Drew comparisons to 12-step coping and offered images for consideration of how God/Spirit might  handle things beyond her control, found reassuring. ? ?Guilty about veterinarian telling her the dog is overweight.  Also says depression has been taking back over of late, she's broken her good habits with walking, etc.  Challenged to apply "just for today" and walk today, let tomorrow be its own decision.  Good for her morale, flexibility, cardiovascular health, depressive inertia, and the dog's weight all at the same time.  Agrees. ? ?Therapeutic modalities: Cognitive Behavioral Therapy, Solution-Oriented/Positive Psychology, and Faith-sensitive ? ?Mental Status/Observations: ? ?Appearance:   Casual     ?Behavior:  Appropriate  ?Motor:  Normal  ?Speech/Language:   Clear and Coherent  ?Affect:  Appropriate  ?Mood:  worrisome  ?Thought process:  normal  ?Thought content:    WNL, worry  ?Sensory/Perceptual disturbances:    WNL  ?Orientation:  Fully oriented  ?Attention:  Good  ?  ?Concentration:  Good  ?Memory:  WNL  ?Insight:    Good  ?Judgment:   Good  ?Impulse Control:  Good  ? ?Risk Assessment: ?Danger to Self: No Self-injurious Behavior: No ?Danger to Others: No Physical Aggression / Violence: No ?Duty to Warn: No Access to Firearms a concern: No ? ?Assessment of progress:  stabilized ? ?Diagnosis: ?  ICD-10-CM   ?1. Generalized anxiety disorder  F41.1   ?  ?2. Adjustment disorder with depressed mood  F43.21   ?  ?3. Relationship problem with family members  Z63.8   ?  ? ?Plan:  ?Self-affirm that depression as she knows it is tied to saturating experiences of helplessness -- seek to break down what may feel helpless in the present and resolve either to  actions she can legitimately take or credible imagery of others taking action, including God ?Focal project to resume walking -- one day at a time -- as exercise in freedom to recover and help to her dog ?Practice trust in Patricia Mclean to manage his role and stresses ?Use imagery to decatastrophize estrangement among Patricia Mclean's family ?Maintain other health habits for  overall wellbeing ?Other recommendations/advice as may be noted above ?Continue to utilize previously learned skills ad lib ?Maintain medication as prescribed and work faithfully with relevant prescriber(s) if any changes are desired or seem indicated ?Call the clinic on-call service, 988/hotline, 911, or present to [facility] or ER if any life-threatening psychiatric crisis ?Return for session(s) already scheduled. ?Already scheduled visit in this office 08/29/2021. ? ?Patricia Fries, PhD ?Marliss Czar, PhD LP ?Clinical Psychologist, [facility] ?[facility] ?[address] ?[place], [place] [address] ?(o) [phone] ?

## 2021-08-29 ENCOUNTER — Other Ambulatory Visit: Payer: Self-pay

## 2021-08-29 ENCOUNTER — Ambulatory Visit (INDEPENDENT_AMBULATORY_CARE_PROVIDER_SITE_OTHER): Payer: BC Managed Care – PPO | Admitting: Psychiatry

## 2021-08-29 DIAGNOSIS — F4321 Adjustment disorder with depressed mood: Secondary | ICD-10-CM | POA: Diagnosis not present

## 2021-08-29 DIAGNOSIS — Z638 Other specified problems related to primary support group: Secondary | ICD-10-CM

## 2021-08-29 DIAGNOSIS — Z636 Dependent relative needing care at home: Secondary | ICD-10-CM

## 2021-08-29 DIAGNOSIS — F411 Generalized anxiety disorder: Secondary | ICD-10-CM | POA: Diagnosis not present

## 2021-08-29 DIAGNOSIS — F40298 Other specified phobia: Secondary | ICD-10-CM

## 2021-08-29 NOTE — Progress Notes (Signed)
Psychotherapy Progress Note ?[facility] ?Marliss Czar, PhD LP ? ?Patient ID: Patricia Mclean)    MRN: [id number] ?Therapy format: Individual psychotherapy ?Date: 08/29/2021      Start: 11:20a     Stop: 12:10p     Time Spent: 50 min ?Location: In-person  ? ?Session narrative (presenting needs, interim history, self-report of stressors and symptoms, applications of prior therapy, status changes, and interventions made in session) ?FIL complaining much less, back in his own home, and both Patricia Mclean and Patricia Mclean are more sanguine, so Patricia Mclean is calmer, too.  Plans still to travel April 3.  Now her own father is acting up.  Stepmother has apparently not revealed enough about the state he is in -- can no longer read, actually, depressed, paranoid about being locked up (dementia) or jail (tax mistakes).  Discussed his condition, her worries,  and possible issues that are more treatable than they seem, incl. B12 deficiency and sleep disorder complications, so she can pass along to Patricia Mclean.  More concerned with how Patricia Mclean seems adamant that he can't/won't receive home care, let alone assisted living facility given Patricia Mclean's buy-in to the horror story of his psychiatric lockup years ago, which obviously isn't the same.  Assured that Patricia Mclean will take off her blinders when she gets tired enough ? ?Realized in discussion that having the history of Patricia Mclean's psychiatric lockup mentioned in the first place (by Patricia Mclean) sparked in her ruminations about her adolescence (sent to [place], international family custody fight).  Supported/encouraged in living with the memory, keeping it realized how she came out of it intact. ? ?Therapeutic modalities: Cognitive Behavioral Therapy, Solution-Oriented/Positive Psychology, and Assertiveness/Communication ? ?Mental Status/Observations: ? ?Appearance:   Casual     ?Behavior:  Appropriate  ?Motor:  Normal  ?Speech/Language:   Clear and Coherent  ?Affect:  Appropriate  ?Mood:   anxious  ?Thought process:  normal  ?Thought content:    Obsessions  ?Sensory/Perceptual disturbances:    WNL  ?Orientation:  Fully oriented  ?Attention:  Good  ?  ?Concentration:  Good  ?Memory:  WNL  ?Insight:    Good  ?Judgment:   Good  ?Impulse Control:  Good  ? ?Risk Assessment: ?Danger to Self: No Self-injurious Behavior: No ?Danger to Others: No Physical Aggression / Violence: No ?Duty to Warn: No Access to Firearms a concern: No ? ?Assessment of progress:  progressing ? ?Diagnosis: ?  ICD-10-CM   ?1. Generalized anxiety disorder  F41.1   ?  ?2. Caregiver stress  Z63.6   ?  ?3. Adjustment disorder with depressed mood  F43.21   ?  ?4. Relationship problem with family members  Z63.8   ?  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  ? ?Plan:  ?Self-affirm coming through adolescent attachment and custody period intact ?Pass along dementia-related care info ?When ready, take up placement with Patricia Mclean -- lead with empathy, acknowledge same side of not wanting to, and just ask how she would tell if Patricia Mclean's needs were starting to get too big for her ?Adopt supportive observer mindset for trip to [place], and enjoy the weather, resort surroundings, and husband's company wherever possible ?Other recommendations/advice as may be noted above ?Continue to utilize previously learned skills ad lib ?Maintain medication as prescribed and work faithfully with relevant prescriber(s) if any changes are desired or seem indicated ?Call the clinic on-call service, 988/hotline, 911, or present to [facility] or ER if any life-threatening psychiatric crisis ?Return for  session(s) already scheduled, recommend scheduling ahead. ?Already scheduled visit in this office 09/05/2021. ? ?Robley Fries, PhD ?Marliss Czar, PhD LP ?Clinical Psychologist, [facility] ?[facility] ?[address], [address] ?[place], [place] [address] ?(o) [phone] ?

## 2021-09-05 ENCOUNTER — Other Ambulatory Visit: Payer: Self-pay

## 2021-09-05 ENCOUNTER — Ambulatory Visit: Payer: BC Managed Care – PPO | Admitting: Psychiatry

## 2021-09-05 ENCOUNTER — Encounter: Payer: Self-pay | Admitting: Psychiatry

## 2021-09-05 DIAGNOSIS — F332 Major depressive disorder, recurrent severe without psychotic features: Secondary | ICD-10-CM

## 2021-09-05 DIAGNOSIS — F411 Generalized anxiety disorder: Secondary | ICD-10-CM

## 2021-09-05 MED ORDER — BREXPIPRAZOLE 1 MG PO TABS
1.0000 mg | ORAL_TABLET | Freq: Every day | ORAL | 0 refills | Status: DC
Start: 2021-10-24 — End: 2021-10-09

## 2021-09-05 MED ORDER — DULOXETINE HCL 60 MG PO CPEP: 60.0000 mg | ORAL_CAPSULE | Freq: Every day | ORAL | 0 refills | Status: DC

## 2021-09-05 MED ORDER — BUSPIRONE HCL 30 MG PO TABS: 30.0000 mg | ORAL_TABLET | Freq: Two times a day (BID) | ORAL | 0 refills | Status: DC

## 2021-09-05 MED ORDER — ALPRAZOLAM 0.25 MG PO TABS: ORAL_TABLET | ORAL | 1 refills | Status: DC

## 2021-09-05 MED ORDER — REXULTI 0.5 MG PO TABS
0.5000 mg | ORAL_TABLET | Freq: Every day | ORAL | 0 refills | Status: DC
Start: 2021-09-05 — End: 2021-10-09

## 2021-09-05 MED ORDER — BREXPIPRAZOLE 1 MG PO TABS: 1.0000 mg | ORAL_TABLET | Freq: Every day | ORAL | 1 refills | Status: DC

## 2021-09-05 MED ORDER — SERTRALINE HCL 100 MG PO TABS: 100.0000 mg | ORAL_TABLET | Freq: Every day | ORAL | 0 refills | Status: DC

## 2021-09-05 NOTE — Progress Notes (Signed)
Patricia Mclean ?[id number] ?[birth year] ?51 y.o. ? ?Subjective:  ? ?Patient ID:  Patricia Mclean is a 51 y.o. (DOB 1971/02/27) female. ? ?Chief Complaint:  ?Chief Complaint  ?Patient presents with  ? Depression  ? Anxiety  ? ? ?HPI ?Patricia Mclean presents to the office today for follow-up of anxiety and depression. She reports, "I've had a set back" with recent worsening in anxiety and depressive s/s. Father-in-law had a heart attack, who checked himself out of of cardiac rehab AMA. They are leaving Monday for [place] to help him. She reports that her husband has been having a difficult time with father's illness. Her father's dementia has been significantly worsening and did not recognize her for the first time.  ? ?She reports persistent depression. She reports crying episodes and feeling on the verge of crying frequently. Reports mood progressively worsened in February. She reports that she stopped walking. She reports that she has been feeling "down on herself." She has also had increased anxiety and worry. Rumination. She reports occasional panic attacks, about once a week. She reports that she has not been sleeping. Difficulty getting to sleep. Sometimes sleeping 3-4 hours a night. One recent sleepless night. Excessive guilt and feeling that she is bothering others. No change in appetite. Has not been cooking recently. Motivation and energy have been lower. Concentration has been diminished and not been reading like she normally would. Some diminished interest. Denies anhedonia. Denies SI.  ? ? ?Past Psychiatric Medication Trials: ?Paxil-May have caused weight gain. Limited improvement. Took 20 mg.  ?Celexa- Denies significant improvement in mood or anxiety. Has taken for about a year.  ?Zoloft ?Cymbalta ?Topamax-Prescribed for migraines. No improvement on anxiety.  ?Alprazolam-Helpful for sleep initiation ?Gabapentin- Prescribed for post-operative nerve pain. Took briefly. Did not seem to improve  anxiety.  ?Buspar- Ineffective ? ?  ? ?GAD-7   ? ?Flowsheet Row Counselor from 11/23/2020 in [facility]  ?Total GAD-7 Score 14  ? ?  ? ?PHQ2-9   ? ?Flowsheet Row Counselor from 11/23/2020 in [facility]  ?PHQ-2 Total Score 3  ?PHQ-9 Total Score 11  ? ?  ? ?Flowsheet Row ED from 04/20/2021 in [facility]  ?C-SSRS RISK CATEGORY No Risk  ? ?  ?  ? ?Review of Systems:  ?Review of Systems  ?Gastrointestinal:   ?     Occ nausea with severe stress  ?Musculoskeletal:  Negative for gait problem.  ?Neurological:  Positive for headaches.  ?Psychiatric/Behavioral:    ?     Please refer to HPI  ? ?Medications: I have reviewed the patient's current medications. ? ?Current Outpatient Medications  ?Medication Sig Dispense Refill  ? Brexpiprazole (REXULTI) 0.5 MG TABS Take 1 tablet (0.5 mg total) by mouth daily. 7 tablet 0  ? brexpiprazole (REXULTI) 1 MG TABS tablet Take 1 tablet (1 mg total) by mouth daily. 30 tablet 1  ? [START ON 10/24/2021] brexpiprazole (REXULTI) 1 MG TABS tablet Take 1 tablet (1 mg total) by mouth daily. 21 tablet 0  ? chlorthalidone (HYGROTON) 25 MG tablet Take 12.5 mg by mouth every morning.    ? clindamycin (CLEOCIN) 150 MG capsule Take 150 mg by mouth every 6 (six) hours. Prior to dental work    ? famotidine (PEPCID) 40 MG tablet Take by mouth.    ? fexofenadine (ALLEGRA) 180 MG tablet Take 180 mg by mouth daily.    ? Methylcellulose, Laxative, (CITRUCEL) 500 MG TABS Take by mouth.    ?  metoprolol tartrate (LOPRESSOR) 50 MG tablet Take 50 mg by mouth 2 (two) times daily.    ? Multiple Vitamins-Minerals (ONE-A-DAY WOMENS PO) Take by mouth.    ? omeprazole (PRILOSEC) 40 MG capsule Take 40 mg by mouth daily.    ? Potassium Chloride ER 20 MEQ TBCR Take 2 tablets by mouth 2 (two) times daily.    ? rosuvastatin (CRESTOR) 20 MG tablet Take 20 mg by mouth daily.    ? sucralfate (CARAFATE) 1 g tablet Take by mouth.    ? Topiramate ER (TROKENDI XR) 100 MG  CP24 Take 100 mg by mouth 2 (two) times daily.    ? vitamin B-12 (CYANOCOBALAMIN) 1000 MCG tablet Take 1,000 mcg by mouth daily.    ? ALPRAZolam (XANAX) 0.25 MG tablet Take 2-3 tablets at bedtime 90 tablet 1  ? busPIRone (BUSPAR) 30 MG tablet Take 1 tablet (30 mg total) by mouth 2 (two) times daily. 180 tablet 0  ? DULoxetine (CYMBALTA) 60 MG capsule Take 1 capsule (60 mg total) by mouth daily. 90 capsule 0  ? HYDROcodone-acetaminophen (NORCO/VICODIN) 5-325 MG tablet Take 1 tablet by mouth every 4 (four) hours as needed. (Patient not taking: Reported on 04/25/2021) 10 tablet 0  ? ibuprofen (ADVIL) 800 MG tablet Take by mouth every 6 (six) hours as needed for cramping.    ? ondansetron (ZOFRAN ODT) 4 MG disintegrating tablet Take 1 tablet (4 mg total) by mouth every 8 (eight) hours as needed for nausea or vomiting. 20 tablet 0  ? sertraline (ZOLOFT) 100 MG tablet Take 1 tablet (100 mg total) by mouth daily. 90 tablet 0  ? ?No current facility-administered medications for this visit.  ? ? ?Medication Side Effects: None ? ?Allergies:  ?Allergies  ?Allergen Reactions  ? Penicillins Anaphylaxis and Shortness Of Breath  ?  Did it involve swelling of the face/tongue/throat, SOB, or low BP? Yes ?Did it involve sudden or severe rash/hives, skin peeling, or any reaction on the inside of your mouth or nose? No ?Did you need to seek medical attention at a hospital or doctor's office? Yes ?When did it last happen?      30+ years ?If all above answers are ?NO?, may proceed with cephalosporin use. ?  ? Avelox [Moxifloxacin Hcl] Hives  ? Levofloxacin Hives  ? ? ?Past Medical History:  ?Diagnosis Date  ? Anxiety   ? GERD (gastroesophageal reflux disease)   ? Headache   ? metoprolol and topamx  ? Hypertension   ? Pneumonia   ? x3 had vaccine  ? ? ?Past Medical History, Surgical history, Social history, and Family history were reviewed and updated as appropriate.  ? ?Please see review of systems for further details on the patient's  review from today.  ? ?Objective:  ? ?Physical Exam:  ?There were no vitals taken for this visit. ? ?Physical Exam ?Constitutional:   ?   General: She is not in acute distress. ?Musculoskeletal:     ?   General: No deformity.  ?Neurological:  ?   Mental Status: She is alert and oriented to person, place, and time.  ?   Coordination: Coordination normal.  ?Psychiatric:     ?   Attention and Perception: Attention and perception normal. She does not perceive auditory or visual hallucinations.     ?   Mood and Affect: Mood is anxious and depressed. Affect is tearful. Affect is not labile, blunt, angry or inappropriate.     ?   Speech: Speech normal.     ?  Behavior: Behavior normal. Behavior is cooperative.     ?   Thought Content: Thought content normal. Thought content is not paranoid or delusional. Thought content does not include homicidal or suicidal ideation. Thought content does not include homicidal or suicidal plan.     ?   Cognition and Memory: Cognition and memory normal.     ?   Judgment: Judgment normal.  ?   Comments: Insight intact  ? ? ?Lab Review:  ?   ?Component Value Date/Time  ? NA 136 04/20/2021 0848  ? K 2.8 (L) 04/20/2021 0848  ? CL 103 04/20/2021 0848  ? CO2 23 04/20/2021 0848  ? GLUCOSE 125 (H) 04/20/2021 0848  ? BUN 10 04/20/2021 0848  ? CREATININE 0.83 04/20/2021 0848  ? CALCIUM 9.1 04/20/2021 0848  ? PROT 7.5 08/21/2018 1503  ? ALBUMIN 4.4 08/21/2018 1503  ? AST 24 08/21/2018 1503  ? ALT 26 08/21/2018 1503  ? ALKPHOS 124 08/21/2018 1503  ? BILITOT 0.6 08/21/2018 1503  ? GFRNONAA >60 04/20/2021 0848  ? GFRAA >60 08/21/2018 1503  ? ? ?   ?Component Value Date/Time  ? WBC 13.5 (H) 04/20/2021 0848  ? RBC 3.93 04/20/2021 0848  ? HGB 12.8 04/20/2021 0848  ? HCT 38.2 04/20/2021 0848  ? PLT 224 04/20/2021 0848  ? MCV 97.2 04/20/2021 0848  ? MCH 32.6 04/20/2021 0848  ? MCHC 33.5 04/20/2021 0848  ? RDW 13.2 04/20/2021 0848  ? LYMPHSABS 1.1 04/20/2021 0848  ? MONOABS 0.7 04/20/2021 0848  ? EOSABS 0.1  04/20/2021 0848  ? BASOSABS 0.1 04/20/2021 0848  ? ? ?No results found for: POCLITH, LITHIUM  ? ?No results found for: PHENYTOIN, PHENOBARB, VALPROATE, CBMZ  ? ?.res ?Assessment: Plan:   ? ?Pt seen for 30

## 2021-09-07 ENCOUNTER — Telehealth: Payer: Self-pay

## 2021-09-07 NOTE — Telephone Encounter (Signed)
Contacted pt to discuss PA denial. Discussed continuing Rexulti samples since she is about to leave for international trip. Will re-evaluate at next visit and determine if appeal for Rexulti is needed or consider alternatives. Pt will call office for samples if needed before follow-up apt.  ?

## 2021-09-07 NOTE — Telephone Encounter (Signed)
Prior Authorization submitted with [employer] for REXULTI 1 MG, initial response was DENIED  ? ? ?Coverage for this medication is denied for the following reason(s). We reviewed the ?information we received about your condition and circumstances. We used the plan ?approved policy when making this decision. The policy states that this medication may ?be approved when: ?- The member has a clinical condition or needs a specific dosage form for which there ?is no alternative on the formulary OR ?- The listed formulary alternatives are not recommended based on published guidelines ?or clinical literature OR ?- The formulary alternatives will likely be ineffective or less effective for the member OR ?- The formulary alternatives will likely cause an adverse effect OR ?- The member is unable to take the required number of formulary alternatives for the ?given diagnosis due to a trial and inadequate treatment response or contraindication ?OR ?- The member has tried and failed the required number of formulary alternatives. ? ?Formulary alternative(s) are aripiprazole; quetiapine extended-release. Requirement: 3 ?in a class with 3 or more alternatives, 2 in a class with 2 alternatives, or 1 in a class with ?only 1 alternative.  ?

## 2021-09-12 ENCOUNTER — Ambulatory Visit: Payer: BC Managed Care – PPO | Admitting: Psychiatry

## 2021-09-14 ENCOUNTER — Ambulatory Visit: Payer: BC Managed Care – PPO | Admitting: Psychiatry

## 2021-09-19 ENCOUNTER — Telehealth: Payer: Self-pay | Admitting: Psychiatry

## 2021-09-19 NOTE — Telephone Encounter (Signed)
Pt called about her denial on Rexulti. She would like to see if we can write a letter or appeal the denial since she is doing well on it. ?Please advise. ?

## 2021-09-20 ENCOUNTER — Encounter: Payer: Self-pay | Admitting: Orthopaedic Surgery

## 2021-09-20 ENCOUNTER — Ambulatory Visit: Payer: BC Managed Care – PPO | Admitting: Orthopaedic Surgery

## 2021-09-20 DIAGNOSIS — M7711 Lateral epicondylitis, right elbow: Secondary | ICD-10-CM | POA: Diagnosis not present

## 2021-09-20 MED ORDER — METHYLPREDNISOLONE ACETATE 40 MG/ML IJ SUSP
40.0000 mg | INTRAMUSCULAR | Status: AC | PRN
  Administered 2021-09-20: 40 mg

## 2021-09-20 MED ORDER — LIDOCAINE HCL 1 % IJ SOLN
1.0000 mL | INTRAMUSCULAR | Status: AC | PRN
  Administered 2021-09-20: 1 mL

## 2021-09-20 NOTE — Progress Notes (Signed)
? ?Office Visit Note ?  ?Patient: Patricia Mclean           ?Date of Birth: 03-29-1971           ?MRN: [id number] ?Visit Date: 09/20/2021 ?             ?Requested by: Mosetta Putt, MD ?[address] ?[place],  [place] [address] ?PCP: Mosetta Putt, MD ? ? ?Assessment & Plan: ?Visit Diagnoses:  ?1. Lateral epicondylitis of right elbow   ? ? ?Plan: I did feel comfortable with providing a steroid injection over the right elbow epicondylar area.  She did tolerate it well.  If her pain recurs and does not get better my next step would be at least MRI her right elbow. ? ?Follow-Up Instructions: Return if symptoms worsen or fail to improve.  ? ?Orders:  ?Orders Placed This Encounter  ?Procedures  ? Hand/UE Inj: R elbow  ? ?No orders of the defined types were placed in this encounter. ? ? ? ? Procedures: ?Hand/UE Inj: R elbow for lateral epicondylitis on 09/20/2021 1:53 PM ?Medications: 1 mL lidocaine 1 %; 40 mg methylPREDNISolone acetate 40 MG/ML ? ? ? ? ?Clinical Data: ?No additional findings. ? ? ?Subjective: ?Chief Complaint  ?Patient presents with  ? Right Elbow - Follow-up  ?The patient comes in today requesting an elbow injection around her right elbow lateral epicondyle area.  We agree with this as well.  She had at least 1 injection months ago.  She does a lot of pronating type of activities that affects this area.  Injections have helped some.  The medical massage did not really help is much as she was liked.  She denies any numbness and tingling or any injuries to that area.  She has been doing great otherwise. ? ?HPI ? ?Review of Systems ?There is no listed fever, chills, nausea, vomiting ? ?Objective: ?Vital Signs: There were no vitals taken for this visit. ? ?Physical Exam ?She is alert and orient x3 and in no acute distress ?Ortho Exam ?Examination of the right elbow she has pain only over the lateral epicondyle consistent with lateral epicondylitis.  The remainder of her elbow exam is  normal. ?Specialty Comments:  ?No specialty comments available. ? ?Imaging: ?No results found. ? ? ?PMFS History: ?Patient Active Problem List  ? Diagnosis Date Noted  ? Avascular necrosis of hip, left (HCC) 08/22/2018  ? Avascular necrosis of femoral head, left (HCC) 08/22/2018  ? ?Past Medical History:  ?Diagnosis Date  ? Anxiety   ? GERD (gastroesophageal reflux disease)   ? Headache   ? metoprolol and topamx  ? Hypertension   ? Pneumonia   ? x3 had vaccine  ?  ?Family History  ?Problem Relation Age of Onset  ? Breast cancer Mother   ? Ovarian cancer Mother   ? Stroke Mother   ? Dementia Father   ? Depression Brother   ? Anxiety disorder Brother   ?  ?Past Surgical History:  ?Procedure Laterality Date  ? TONSILLECTOMY    ? 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 Procedure: TOTAL HIP ARTHROPLASTY ANTERIOR APPROACH;  Surgeon: Jodi Geralds, MD;  Location: [facility];  Service: Orthopedics;  Laterality: Left;  ? ?Social History  ? ?Occupational History  ? Not on file  ?Tobacco Use  ? Smoking status: Never  ? Smokeless tobacco: Never  ?Vaping Use  ? Vaping Use: Never used  ?Substance and Sexual Activity  ? Alcohol use: Yes  ?  Alcohol/week:  1.0 standard drink  ?  Types: 1 Glasses of wine per week  ?  Comment: occasional  wine with dinner  ? Drug use: Never  ? Sexual activity: Yes  ? ? ? ? ? ? ?

## 2021-09-22 ENCOUNTER — Telehealth: Payer: Self-pay | Admitting: Psychiatry

## 2021-09-22 NOTE — Telephone Encounter (Signed)
Patient was recently started on a titration of Rexulti, is now taking 1 mg. Patient has h/o reflux for which she takes Pepcid and Carafate. She feels like her reflux is exacerbated with the Rexulti. She has no other potential SE and does state that she feels like it has helped her depression. Her PA has been denied so the medication may need to be changed anyway. She sees Shanda Bumps 4/28.  ?

## 2021-09-22 NOTE — Telephone Encounter (Signed)
Patient notified

## 2021-09-22 NOTE — Telephone Encounter (Signed)
She can stop the she can stop the Rexulti .  We will not start anything else until we can see the that the reflux problem is back under control ?

## 2021-09-22 NOTE — Telephone Encounter (Signed)
Forwarding you this thread for your information. ?

## 2021-09-22 NOTE — Telephone Encounter (Signed)
Next visit is 10/06/21. Patricia Mclean called and said that she is having stomach trouble taking the Rexulti. Please call her at [phone]. ?

## 2021-09-22 NOTE — Telephone Encounter (Signed)
LVM to RC. On 4/11 patient had called about her PA denial and said she was doing well on Rexulti.  ?

## 2021-09-25 NOTE — Telephone Encounter (Signed)
Noted. Will discuss with patient during upcoming apt.  ?

## 2021-09-26 ENCOUNTER — Ambulatory Visit: Payer: BC Managed Care – PPO | Admitting: Psychiatry

## 2021-10-03 ENCOUNTER — Ambulatory Visit: Payer: BC Managed Care – PPO | Admitting: Psychiatry

## 2021-10-03 DIAGNOSIS — F4321 Adjustment disorder with depressed mood: Secondary | ICD-10-CM | POA: Diagnosis not present

## 2021-10-03 DIAGNOSIS — F40298 Other specified phobia: Secondary | ICD-10-CM

## 2021-10-03 DIAGNOSIS — F5104 Psychophysiologic insomnia: Secondary | ICD-10-CM | POA: Diagnosis not present

## 2021-10-03 DIAGNOSIS — F411 Generalized anxiety disorder: Secondary | ICD-10-CM

## 2021-10-03 NOTE — Progress Notes (Signed)
Psychotherapy Progress Note [facility] Marliss Czar, PhD LP  Patient ID: MARENA WITTS)    MRN: [id number] Therapy format: Individual psychotherapy Date: 10/03/2021      Start: 8:15a     Stop: 9:00a     Time Spent: 45 min Location: In-person   Session narrative (presenting needs, interim history, self-report of stressors and symptoms, applications of prior therapy, status changes, and interventions made in session) Been consulting again for a little while.  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  Very torn about it, losing some sleep.  Would be very well-paid, near [place], away from Short reminders, and she trusts the character of the owners, but very isolated, her good friend is pretty well locked in at [facility], and she loves the place they live.  Culture change to [facility] culture, and open questions what the resources are for veterinarians and other important things.  Discussed complex choice and agreed to list all her considerations pro, con, and need more information in order to objectify, externalize, and plan to reconnoiter on an extended visit.  Encouraged to lay out her considerations, remember any decision can be reconsidered if it starts to not work out, and sh can console herself at night by reminding herself what fact-finding and considering she's done by day.  Therapeutic modalities: Cognitive Behavioral Therapy and Solution-Oriented/Positive Psychology  Mental Status/Observations:  Appearance:   Casual     Behavior:  Appropriate  Motor:   Normal  Speech/Language:   Clear and Coherent  Affect:  Appropriate  Mood:  anxious  Thought process:  normal  Thought content:    WNL  Sensory/Perceptual disturbances:    WNL  Orientation:  Fully oriented  Attention:  Good    Concentration:  Good  Memory:  WNL  Insight:    Good  Judgment:   Good  Impulse Control:  Good   Risk Assessment: Danger to Self: No Self-injurious Behavior: No Danger to Others: No Physical Aggression / Violence: No Duty to Warn: No Access to Firearms a concern: No  Assessment of progress:  progressing  Diagnosis:   ICD-10-CM   1. Generalized anxiety disorder  F41.1     2. 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      Plan:  Lay out considerations for move/no move, and things to find out better that would influence decision to take the job Self-affirm good work in progress thinking it through, enough to allow decision to sleep, and segregate the problem-solving environment from sleep environment Other recommendations/advice as may be noted above Continue to utilize previously learned skills ad lib Maintain medication as prescribed and work faithfully with relevant prescriber(s) if any changes are desired or seem indicated Call the clinic on-call service, 988/hotline, 911, or present to [facility] or ER if any life-threatening psychiatric crisis Return for time as available. Already scheduled visit in this office 10/06/2021.  [name], PhD [name], PhD LP Clinical Psychologist, [facility] [address], [place], [place] [address] [phone]

## 2021-10-06 ENCOUNTER — Telehealth: Payer: BC Managed Care – PPO | Admitting: Psychiatry

## 2021-10-09 ENCOUNTER — Encounter: Payer: Self-pay | Admitting: Psychiatry

## 2021-10-09 ENCOUNTER — Ambulatory Visit (INDEPENDENT_AMBULATORY_CARE_PROVIDER_SITE_OTHER): Payer: BC Managed Care – PPO | Admitting: Psychiatry

## 2021-10-09 DIAGNOSIS — F330 Major depressive disorder, recurrent, mild: Secondary | ICD-10-CM

## 2021-10-09 DIAGNOSIS — F411 Generalized anxiety disorder: Secondary | ICD-10-CM | POA: Diagnosis not present

## 2021-10-09 DIAGNOSIS — F331 Major depressive disorder, recurrent, moderate: Secondary | ICD-10-CM | POA: Diagnosis not present

## 2021-10-09 MED ORDER — BUSPIRONE HCL 30 MG PO TABS: 30.0000 mg | ORAL_TABLET | Freq: Two times a day (BID) | ORAL | 1 refills | Status: DC

## 2021-10-09 MED ORDER — DULOXETINE HCL 60 MG PO CPEP: 60.0000 mg | ORAL_CAPSULE | Freq: Every day | ORAL | 1 refills | Status: DC

## 2021-10-09 MED ORDER — ARIPIPRAZOLE 2 MG PO TABS: 2.0000 mg | ORAL_TABLET | Freq: Every day | ORAL | 0 refills | Status: DC

## 2021-10-09 MED ORDER — SERTRALINE HCL 100 MG PO TABS: 100.0000 mg | ORAL_TABLET | Freq: Every day | ORAL | 0 refills | Status: DC

## 2021-10-09 MED ORDER — ALPRAZOLAM 0.25 MG PO TABS: ORAL_TABLET | ORAL | 0 refills | Status: DC

## 2021-10-09 NOTE — Progress Notes (Signed)
MORRISSA SHEIN ?[id number] ?[birth year] ?51 y.o. ? ?Virtual Visit via Telephone Note ? ?I connected with pt on 10/09/21 at  2:15 PM EDT by telephone and verified that I am speaking with the correct person using two identifiers. ?  ?I discussed the limitations, risks, security and privacy concerns of performing an evaluation and management service by telephone and the availability of in person appointments. I also discussed with the patient that there may be a patient responsible charge related to this service. The patient expressed understanding and agreed to proceed. ?  ?I discussed the assessment and treatment plan with the patient. The patient was provided an opportunity to ask questions and all were answered. The patient agreed with the plan and demonstrated an understanding of the instructions. ?  ?The patient was advised to call back or seek an in-person evaluation if the symptoms worsen or if the condition fails to improve as anticipated. ? ?I provided 25 minutes of non-face-to-face time during this encounter.  The patient was located at home.  The provider was located at [facility]. ? ? ?Corie Chiquito, PMHNP ? ? ?Subjective:  ? ?Patient ID:  SHARDEE DIEU is a 51 y.o. (DOB [birth year]) female. ? ?Chief Complaint:  ?Chief Complaint  ?Patient presents with  ? Anxiety  ? Follow-up  ?  Depression  ? ? ?Anxiety ? ? ? ?Felipa Eth presents for follow-up of depression and anxiety. She reports, "I'm feeling a lot better." She reports that she thought Rexulti was causing her heartburn and then realized it was probably related to antibiotic because reflux cleared after she stopped the antibiotic. She reports that she started to notice an improvement in depression after about 2 weeks of taking Rexulti. She feels that depression is "good right now." She reports that she notices continued anxiety. She continues to notice worry and rumination. Some catastrophic thoughts. She had a severe panic  attack recently when she thought her dog got out of the fence. She had nightmares that night about something bad happening to her dog. She reports that she had gone about 2-3 weeks without a panic attack. She denies any significant change in anxiety. Energy and motivation have improved. She is now walking her dogs again. She reports that she is experiencing some middle of the night awakenings. Sleeps 10-2 am and falls back to sleep around 6 am. No change in appetite or increased food cravings. Denies concentration impairment. Increased interest in things. Denies SI.  ? ?She has been doing some consulting. She reports that this has been helpful for her mood and they are acknowledging that she is doing a good job. She will be in [place] for June, July, and August.  ? ?Past Psychiatric Medication Trials: ?Paxil-May have caused weight gain. Limited improvement. Took 20 mg.  ?Celexa- Denies significant improvement in mood or anxiety. Has taken for about a year.  ?Zoloft ?Cymbalta ?Topamax-Prescribed for migraines. No improvement on anxiety.  ?Alprazolam-Helpful for sleep initiation ?Gabapentin- Prescribed for post-operative nerve pain. Took briefly. Did not seem to improve anxiety.  ?Buspar- Ineffective ?  ? ?Review of Systems:  ?Review of Systems  ?Musculoskeletal:  Negative for gait problem.  ?Neurological:  Negative for tremors.  ?Psychiatric/Behavioral:    ?     Please refer to HPI  ? ?Medications: I have reviewed the patient's current medications. ? ?Current Outpatient Medications  ?Medication Sig Dispense Refill  ? ARIPiprazole (ABILIFY) 2 MG tablet Take 1 tablet (2 mg total) by mouth daily. 30 tablet 0  ?  chlorthalidone (HYGROTON) 25 MG tablet Take 12.5 mg by mouth every morning.    ? famotidine (PEPCID) 40 MG tablet Take by mouth.    ? fexofenadine (ALLEGRA) 180 MG tablet Take 180 mg by mouth daily.    ? ibuprofen (ADVIL) 800 MG tablet Take by mouth every 6 (six) hours as needed for cramping.    ? Methylcellulose,  Laxative, (CITRUCEL) 500 MG TABS Take by mouth.    ? metoprolol tartrate (LOPRESSOR) 50 MG tablet Take 50 mg by mouth 2 (two) times daily.    ? Multiple Vitamins-Minerals (ONE-A-DAY WOMENS PO) Take by mouth.    ? omeprazole (PRILOSEC) 40 MG capsule Take 40 mg by mouth daily.    ? ondansetron (ZOFRAN ODT) 4 MG disintegrating tablet Take 1 tablet (4 mg total) by mouth every 8 (eight) hours as needed for nausea or vomiting. 20 tablet 0  ? Potassium Chloride ER 20 MEQ TBCR Take 2 tablets by mouth 2 (two) times daily.    ? rosuvastatin (CRESTOR) 20 MG tablet Take 20 mg by mouth daily.    ? sucralfate (CARAFATE) 1 g tablet Take by mouth.    ? Topiramate ER (TROKENDI XR) 100 MG CP24 Take 100 mg by mouth 2 (two) times daily.    ? vitamin B-12 (CYANOCOBALAMIN) 1000 MCG tablet Take 1,000 mcg by mouth daily.    ? [START ON 11/11/2021] ALPRAZolam (XANAX) 0.25 MG tablet Take 2-3 tablets at bedtime 270 tablet 0  ? busPIRone (BUSPAR) 30 MG tablet Take 1 tablet (30 mg total) by mouth 2 (two) times daily. 180 tablet 1  ? clindamycin (CLEOCIN) 150 MG capsule Take 150 mg by mouth every 6 (six) hours. Prior to dental work    ? DULoxetine (CYMBALTA) 60 MG capsule Take 1 capsule (60 mg total) by mouth daily. 90 capsule 1  ? HYDROcodone-acetaminophen (NORCO/VICODIN) 5-325 MG tablet Take 1 tablet by mouth every 4 (four) hours as needed. (Patient not taking: Reported on 04/25/2021) 10 tablet 0  ? sertraline (ZOLOFT) 100 MG tablet Take 1 tablet (100 mg total) by mouth daily. 90 tablet 0  ? ?No current facility-administered medications for this visit.  ? ? ?Medication Side Effects: None ? ?Allergies:  ?Allergies  ?Allergen Reactions  ? Penicillins Anaphylaxis and Shortness Of Breath  ?  Did it involve swelling of the face/tongue/throat, SOB, or low BP? Yes ?Did it involve sudden or severe rash/hives, skin peeling, or any reaction on the inside of your mouth or nose? No ?Did you need to seek medical attention at a hospital or doctor's office?  Yes ?When did it last happen?      30+ years ?If all above answers are ?NO?, may proceed with cephalosporin use. ?  ? Avelox [Moxifloxacin Hcl] Hives  ? Levofloxacin Hives  ? ? ?Past Medical History:  ?Diagnosis Date  ? Anxiety   ? GERD (gastroesophageal reflux disease)   ? Headache   ? metoprolol and topamx  ? Hypertension   ? Pneumonia   ? x3 had vaccine  ? ? ?Family History  ?Problem Relation Age of Onset  ? Breast cancer Mother   ? Ovarian cancer Mother   ? Stroke Mother   ? Dementia Father   ? Depression Brother   ? Anxiety disorder Brother   ? ? ?Social History  ? ?Socioeconomic History  ? Marital status: Married  ?  Spouse name: Not on file  ? Number of children: Not on file  ? Years of education: Not on file  ?  Highest education level: Not on file  ?Occupational History  ? Not on file  ?Tobacco Use  ? Smoking status: Never  ? Smokeless tobacco: Never  ?Vaping Use  ? Vaping Use: Never used  ?Substance and Sexual Activity  ? Alcohol use: Yes  ?  Alcohol/week: 1.0 standard drink  ?  Types: 1 Glasses of wine per week  ?  Comment: occasional  wine with dinner  ? Drug use: Never  ? Sexual activity: Yes  ?Other Topics Concern  ? Not on file  ?Social History Narrative  ? Not on file  ? ?Social Determinants of Health  ? ?Financial Resource Strain: Not on file  ?Food Insecurity: Not on file  ?Transportation Needs: Not on file  ?Physical Activity: Not on file  ?Stress: Not on file  ?Social Connections: Not on file  ?Intimate Partner Violence: Not on file  ? ? ?Past Medical History, Surgical history, Social history, and Family history were reviewed and updated as appropriate.  ? ?Please see review of systems for further details on the patient's review from today.  ? ?Objective:  ? ?Physical Exam:  ?There were no vitals taken for this visit. ? ?Physical Exam ?Neurological:  ?   Mental Status: She is alert and oriented to person, place, and time.  ?   Cranial Nerves: No dysarthria.  ?Psychiatric:     ?   Attention and  Perception: Attention and perception normal.     ?   Mood and Affect: Mood is anxious. Mood is not depressed.     ?   Speech: Speech normal.     ?   Behavior: Behavior is cooperative.     ?   Thought Content: Lowella Dandy

## 2021-10-10 ENCOUNTER — Ambulatory Visit: Payer: BC Managed Care – PPO | Admitting: Psychiatry

## 2021-10-24 ENCOUNTER — Ambulatory Visit: Payer: BC Managed Care – PPO | Admitting: Psychiatry

## 2021-10-30 ENCOUNTER — Ambulatory Visit: Payer: BC Managed Care – PPO | Admitting: Psychiatry

## 2021-11-02 ENCOUNTER — Encounter: Payer: Self-pay | Admitting: Psychiatry

## 2021-11-02 ENCOUNTER — Ambulatory Visit: Payer: BC Managed Care – PPO | Admitting: Psychiatry

## 2021-11-02 DIAGNOSIS — F330 Major depressive disorder, recurrent, mild: Secondary | ICD-10-CM | POA: Diagnosis not present

## 2021-11-02 DIAGNOSIS — F331 Major depressive disorder, recurrent, moderate: Secondary | ICD-10-CM

## 2021-11-02 DIAGNOSIS — F411 Generalized anxiety disorder: Secondary | ICD-10-CM | POA: Diagnosis not present

## 2021-11-02 MED ORDER — ARIPIPRAZOLE 2 MG PO TABS: 2.0000 mg | ORAL_TABLET | Freq: Every day | ORAL | 1 refills | Status: DC

## 2021-11-02 MED ORDER — ALPRAZOLAM 0.25 MG PO TABS: ORAL_TABLET | ORAL | 0 refills | Status: DC

## 2021-11-02 MED ORDER — DULOXETINE HCL 60 MG PO CPEP: 60.0000 mg | ORAL_CAPSULE | Freq: Every day | ORAL | 1 refills | Status: DC

## 2021-11-02 MED ORDER — SERTRALINE HCL 100 MG PO TABS: 100.0000 mg | ORAL_TABLET | Freq: Every day | ORAL | 1 refills | Status: DC

## 2021-11-02 MED ORDER — BUSPIRONE HCL 30 MG PO TABS: 30.0000 mg | ORAL_TABLET | Freq: Two times a day (BID) | ORAL | 1 refills | Status: DC

## 2021-11-02 NOTE — Progress Notes (Signed)
Patricia EthStephanie O Resende [id number] 04/22/1971 51 y.o.  Subjective:   Patient ID:  Patricia Mclean is a 51 y.o. (DOB 04/12/1971) female.  Chief Complaint:  Chief Complaint  Patient presents with   Follow-up    Anxiety, depression, and insomnia    HPI Patricia Mclean presents to the office today for follow-up of anxiety, depression, and insomnia. She reports that she did not notice any changes between Abilify to Rexulti. Denies any side effects with Abilify. No change in appetite or weight.   Denies depressed mood. She reports that she has been out exercising and walking her dogs. Sleep is good most nights as long as she takes Xanax. Concentration has been good and her employer is happy with her work. Increased interest and enjoyment in things. Denies SI.   She reports that her anxiety has been "surprisingly good." She reports that she would have thought she would have more anxiety with going to [place]. She reports that she is looking forward to traveling from [place]. She reports that worry has improved. Occ catastrophic thoughts. She reports that there was one night she did not sleep well due to worry about packing and some possible panic. Had one panic attack when thinking about how her dogs might do with the transition.   She has been working remotely and feels like her work has been a good situation.   Husband works remotely.   Father lives in [place] and has dementia. Father-in-law has been doing well.   Past Psychiatric Medication Trials: Paxil-May have caused weight gain. Limited improvement. Took 20 mg.  Celexa- Denies significant improvement in mood or anxiety. Has taken for about a year.  Zoloft Cymbalta Topamax-Prescribed for migraines. No improvement on anxiety.  Alprazolam-Helpful for sleep initiation Gabapentin- Prescribed for post-operative nerve pain. Took briefly. Did not seem to improve anxiety.  Buspar- Ineffective  AIMS    Flowsheet Row Office Visit  from 11/02/2021 in [facility]  AIMS Total Score 0      GAD-7    Flowsheet Row Counselor from 11/23/2020 in [facility]  Total GAD-7 Score 14      PHQ2-9    Flowsheet Row Counselor from 11/23/2020 in [facility]  PHQ-2 Total Score 3  PHQ-9 Total Score 11      Flowsheet Row ED from 04/20/2021 in [facility]  C-SSRS RISK CATEGORY No Risk        Review of Systems:  Review of Systems  Medications: I have reviewed the patient's current medications.  Current Outpatient Medications  Medication Sig Dispense Refill   chlorthalidone (HYGROTON) 25 MG tablet Take 12.5 mg by mouth every morning.     clindamycin (CLEOCIN) 150 MG capsule Take 150 mg by mouth every 6 (six) hours. Prior to dental work     famotidine (PEPCID) 40 MG tablet Take by mouth.     fexofenadine (ALLEGRA) 180 MG tablet Take 180 mg by mouth daily.     ibuprofen (ADVIL) 800 MG tablet Take by mouth every 6 (six) hours as needed for cramping.     Methylcellulose, Laxative, (CITRUCEL) 500 MG TABS Take by mouth.     metoprolol tartrate (LOPRESSOR) 50 MG tablet Take 50 mg by mouth 2 (two) times daily.     Multiple Vitamins-Minerals (ONE-A-DAY WOMENS PO) Take by mouth.     omeprazole (PRILOSEC) 40 MG capsule Take 40 mg by mouth daily.     ondansetron (ZOFRAN ODT) 4 MG disintegrating tablet Take  1 tablet (4 mg total) by mouth every 8 (eight) hours as needed for nausea or vomiting. 20 tablet 0   Potassium Chloride ER 20 MEQ TBCR Take 2 tablets by mouth 2 (two) times daily.     rosuvastatin (CRESTOR) 20 MG tablet Take 20 mg by mouth daily.     sucralfate (CARAFATE) 1 g tablet Take by mouth daily as needed.     Topiramate ER (TROKENDI XR) 100 MG CP24 Take 100 mg by mouth 2 (two) times daily.     vitamin B-12 (CYANOCOBALAMIN) 1000 MCG tablet Take 1,000 mcg by mouth daily.     ALPRAZolam (XANAX) 0.25 MG tablet Take 2-3 tablets at bedtime 270 tablet  0   ARIPiprazole (ABILIFY) 2 MG tablet Take 1 tablet (2 mg total) by mouth daily. 90 tablet 1   busPIRone (BUSPAR) 30 MG tablet Take 1 tablet (30 mg total) by mouth 2 (two) times daily. 180 tablet 1   DULoxetine (CYMBALTA) 60 MG capsule Take 1 capsule (60 mg total) by mouth daily. 90 capsule 1   HYDROcodone-acetaminophen (NORCO/VICODIN) 5-325 MG tablet Take 1 tablet by mouth every 4 (four) hours as needed. (Patient not taking: Reported on 04/25/2021) 10 tablet 0   sertraline (ZOLOFT) 100 MG tablet Take 1 tablet (100 mg total) by mouth daily. 90 tablet 1   No current facility-administered medications for this visit.    Medication Side Effects: None  Allergies:  Allergies  Allergen Reactions   Penicillins Anaphylaxis and Shortness Of Breath    Did it involve swelling of the face/tongue/throat, SOB, or low BP? Yes Did it involve sudden or severe rash/hives, skin peeling, or any reaction on the inside of your mouth or nose? No Did you need to seek medical attention at a hospital or doctor's office? Yes When did it last happen?      30+ years If all above answers are "NO", may proceed with cephalosporin use.    Avelox [Moxifloxacin Hcl] Hives   Levofloxacin Hives    Past Medical History:  Diagnosis Date   Anxiety    GERD (gastroesophageal reflux disease)    Headache    metoprolol and topamx   Hypertension    Pneumonia    x3 had vaccine    Past Medical History, Surgical history, Social history, and Family history were reviewed and updated as appropriate.   Please see review of systems for further details on the patient's review from today.   Objective:   Physical Exam:  There were no vitals taken for this visit.  Physical Exam Constitutional:      General: She is not in acute distress. Musculoskeletal:        General: No deformity.  Neurological:     Mental Status: She is alert and oriented to person, place, and time.     Coordination: Coordination normal.   Psychiatric:        Attention and Perception: Attention and perception normal. She does not perceive auditory or visual hallucinations.        Mood and Affect: Mood normal. Mood is not anxious or depressed. Affect is not labile, blunt, angry or inappropriate.        Speech: Speech normal.        Behavior: Behavior normal.        Thought Content: Thought content normal. Thought content is not paranoid or delusional. Thought content does not include homicidal or suicidal ideation. Thought content does not include homicidal or suicidal plan.  Cognition and Memory: Cognition and memory normal.        Judgment: Judgment normal.     Comments: Insight intact    Lab Review:     Component Value Date/Time   NA 136 04/20/2021 0848   K 2.8 (L) 04/20/2021 0848   CL 103 04/20/2021 0848   CO2 23 04/20/2021 0848   GLUCOSE 125 (H) 04/20/2021 0848   BUN 10 04/20/2021 0848   CREATININE 0.83 04/20/2021 0848   CALCIUM 9.1 04/20/2021 0848   PROT 7.5 08/21/2018 1503   ALBUMIN 4.4 08/21/2018 1503   AST 24 08/21/2018 1503   ALT 26 08/21/2018 1503   ALKPHOS 124 08/21/2018 1503   BILITOT 0.6 08/21/2018 1503   GFRNONAA >60 04/20/2021 0848   GFRAA >60 08/21/2018 1503       Component Value Date/Time   WBC 13.5 (H) 04/20/2021 0848   RBC 3.93 04/20/2021 0848   HGB 12.8 04/20/2021 0848   HCT 38.2 04/20/2021 0848   PLT 224 04/20/2021 0848   MCV 97.2 04/20/2021 0848   MCH 32.6 04/20/2021 0848   MCHC 33.5 04/20/2021 0848   RDW 13.2 04/20/2021 0848   LYMPHSABS 1.1 04/20/2021 0848   MONOABS 0.7 04/20/2021 0848   EOSABS 0.1 04/20/2021 0848   BASOSABS 0.1 04/20/2021 0848    No results found for: POCLITH, LITHIUM   No results found for: PHENYTOIN, PHENOBARB, VALPROATE, CBMZ   .res Assessment: Plan:   Pt seen for 30 minutes and time spent discussing plan to continue medications while she is in [place] for the summer. Will send in 90-day supplies of medications and discussed that authorization could  be given to fill medications several days earlier if needed. Discussed that scripts could also be sent to [place] if needed.  Will continue Abilify 2 mg po qd for depression since she reports that her mood has been stable. Continue Buspar 30 mg po BID for anxiety.  Continue Cymbalta 60 mg po qd for depression.  Continue Sertraline 100 mg po qd for anxiety.  Pt to follow-up in 3 months or sooner if clinically indicated.  Patient advised to contact office with any questions, adverse effects, or acute worsening in signs and symptoms.  Patricia Mclean was seen today for follow-up.  Diagnoses and all orders for this visit:  Generalized anxiety disorder -     ALPRAZolam (XANAX) 0.25 MG tablet; Take 2-3 tablets at bedtime -     busPIRone (BUSPAR) 30 MG tablet; Take 1 tablet (30 mg total) by mouth 2 (two) times daily. -     DULoxetine (CYMBALTA) 60 MG capsule; Take 1 capsule (60 mg total) by mouth daily. -     sertraline (ZOLOFT) 100 MG tablet; Take 1 tablet (100 mg total) by mouth daily.  Moderate recurrent major depression (HCC) -     ARIPiprazole (ABILIFY) 2 MG tablet; Take 1 tablet (2 mg total) by mouth daily.  Mild episode of recurrent major depressive disorder (HCC) -     ARIPiprazole (ABILIFY) 2 MG tablet; Take 1 tablet (2 mg total) by mouth daily.     Please see After Visit Summary for patient specific instructions.  Future Appointments  Date Time Provider Department Center  02/20/2022  1:00 PM Corie Chiquito, PMHNP [facility] None    No orders of the defined types were placed in this encounter.   -------------------------------

## 2021-11-07 ENCOUNTER — Ambulatory Visit: Payer: BC Managed Care – PPO | Admitting: Psychiatry

## 2021-11-21 ENCOUNTER — Ambulatory Visit: Payer: BC Managed Care – PPO | Admitting: Psychiatry

## 2021-12-05 ENCOUNTER — Ambulatory Visit: Payer: BC Managed Care – PPO | Admitting: Psychiatry

## 2021-12-19 ENCOUNTER — Ambulatory Visit: Payer: BC Managed Care – PPO | Admitting: Psychiatry

## 2022-01-02 ENCOUNTER — Ambulatory Visit: Payer: BC Managed Care – PPO | Admitting: Psychiatry

## 2022-01-16 ENCOUNTER — Ambulatory Visit: Payer: BC Managed Care – PPO | Admitting: Psychiatry

## 2022-01-30 ENCOUNTER — Ambulatory Visit: Payer: BC Managed Care – PPO | Admitting: Psychiatry

## 2022-02-20 ENCOUNTER — Ambulatory Visit: Payer: BC Managed Care – PPO | Admitting: Psychiatry

## 2022-03-12 ENCOUNTER — Ambulatory Visit: Payer: BC Managed Care – PPO | Admitting: Psychiatry

## 2022-03-12 ENCOUNTER — Encounter: Payer: Self-pay | Admitting: Psychiatry

## 2022-03-12 DIAGNOSIS — F3342 Major depressive disorder, recurrent, in full remission: Secondary | ICD-10-CM | POA: Diagnosis not present

## 2022-03-12 DIAGNOSIS — F411 Generalized anxiety disorder: Secondary | ICD-10-CM

## 2022-03-12 MED ORDER — SERTRALINE HCL 100 MG PO TABS: 100.0000 mg | ORAL_TABLET | Freq: Every day | ORAL | 1 refills | Status: DC

## 2022-03-12 MED ORDER — ALPRAZOLAM 0.25 MG PO TABS: ORAL_TABLET | ORAL | 0 refills | Status: DC

## 2022-03-12 MED ORDER — ARIPIPRAZOLE 5 MG PO TABS: 5.0000 mg | ORAL_TABLET | Freq: Every day | ORAL | 2 refills | Status: DC

## 2022-03-12 MED ORDER — DULOXETINE HCL 60 MG PO CPEP: 60.0000 mg | ORAL_CAPSULE | Freq: Every day | ORAL | 1 refills | Status: DC

## 2022-03-12 MED ORDER — BUSPIRONE HCL 30 MG PO TABS: 30.0000 mg | ORAL_TABLET | Freq: Two times a day (BID) | ORAL | 1 refills | Status: DC

## 2022-03-12 NOTE — Progress Notes (Signed)
Patricia Mclean [id number] [birth year] 51 y.o.  Subjective:   Patient ID:  Patricia Mclean is a 51 y.o. (DOB [birth year]) female.  Chief Complaint:  Chief Complaint  Patient presents with   Anxiety    HPI [name] presents to the office today for follow-up of anxiety and depression. She reports that she has been "really good.Marland KitchenMarland KitchenI think we have found a good combination of meds." Denies depressed mood. She reports "some anxiety, still." She reports some worry and occasional rumination. For example, she her key fob died and she was worried about not being able to use her car. Some occ intrusive thoughts. Denies any recent panic attacks. She reports losing 13 lbs this summer. She reports that she was more active and her energy and motivation have been good. Appetite has been slightly less. Concentration has been good. Denies anhedonia. Sleep has been ok. Denies SI.   Reports chronic health-related anxiety and attributes this to her mother dying young. Mother died when Kailee was 39 yo. She reports that she cannot remember mother being well and continues to have nightmares about mother's condition.   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 Just returned last night from [place]. Dogs did well while away.   Father has not been doing well and has been increasingly confused. She talks with him daily. She plans to visit father over Thanksgiving. Her brother will also be there. Step-mother does not want to move. Father-in-law has recovered well.   Planning 25th wedding anniversary trip in December.   Past Psychiatric Medication Trials: Paxil-May have caused weight gain. Limited improvement. Took 20 mg.  Celexa- Denies significant improvement in mood or anxiety. Has taken for about a year.  Zoloft Cymbalta Topamax-Prescribed for migraines. No improvement on anxiety.  Alprazolam-Helpful for sleep initiation Gabapentin- Prescribed for post-operative nerve pain. Took  briefly. Did not seem to improve anxiety.  Buspar- Ineffective  AIMS    Flowsheet Row Office Visit from 03/12/2022 in [facility] Office Visit from 11/02/2021 in [facility]  AIMS Total Score 0 0      GAD-7    Flowsheet Row Counselor from 11/23/2020 in [facility]  Total GAD-7 Score 14      PHQ2-9    Flowsheet Row Counselor from 11/23/2020 in [facility]  PHQ-2 Total Score 3  PHQ-9 Total Score 11      Flowsheet Row ED from 04/20/2021 in [facility]  C-SSRS RISK CATEGORY No Risk        Review of Systems:  Review of Systems  Musculoskeletal:  Negative for gait problem.  Neurological:  Negative for tremors.  Psychiatric/Behavioral:         Please refer to HPI    Medications: I have reviewed the patient's current medications.  Current Outpatient Medications  Medication Sig Dispense Refill   chlorthalidone (HYGROTON) 25 MG tablet Take 12.5 mg by mouth every morning.     clindamycin (CLEOCIN) 150 MG capsule Take 150 mg by mouth every 6 (six) hours. Prior to dental work     famotidine (PEPCID) 40 MG tablet Take by mouth.     fexofenadine (ALLEGRA) 180 MG tablet Take 180 mg by mouth daily.     ibuprofen (ADVIL) 800 MG tablet Take by mouth every 6 (six) hours as needed for cramping.     Methylcellulose, Laxative, (CITRUCEL) 500 MG TABS Take by mouth.     metoprolol tartrate (LOPRESSOR) 50 MG tablet Take 50 mg  by mouth 2 (two) times daily.     Multiple Vitamins-Minerals (ONE-A-DAY WOMENS PO) Take by mouth.     omeprazole (PRILOSEC) 40 MG capsule Take 40 mg by mouth daily.     Potassium Chloride ER 20 MEQ TBCR Take 2 tablets by mouth 2 (two) times daily.     rosuvastatin (CRESTOR) 20 MG tablet Take 20 mg by mouth daily.     sucralfate (CARAFATE) 1 g tablet Take by mouth daily as needed.     Topiramate ER (TROKENDI XR) 100 MG CP24 Take 100 mg by mouth 2 (two) times daily.      vitamin B-12 (CYANOCOBALAMIN) 1000 MCG tablet Take 1,000 mcg by mouth daily.     ALPRAZolam (XANAX) 0.25 MG tablet Take 2-3 tablets at bedtime 270 tablet 0   ARIPiprazole (ABILIFY) 5 MG tablet Take 1 tablet (5 mg total) by mouth daily. 30 tablet 2   busPIRone (BUSPAR) 30 MG tablet Take 1 tablet (30 mg total) by mouth 2 (two) times daily. 180 tablet 1   DULoxetine (CYMBALTA) 60 MG capsule Take 1 capsule (60 mg total) by mouth daily. 90 capsule 1   ondansetron (ZOFRAN ODT) 4 MG disintegrating tablet Take 1 tablet (4 mg total) by mouth every 8 (eight) hours as needed for nausea or vomiting. 20 tablet 0   sertraline (ZOLOFT) 100 MG tablet Take 1 tablet (100 mg total) by mouth daily. 90 tablet 1   No current facility-administered medications for this visit.    Medication Side Effects: None  Allergies:  Allergies  Allergen Reactions   Penicillins Anaphylaxis and Shortness Of Breath    Did it involve swelling of the face/tongue/throat, SOB, or low BP? Yes Did it involve sudden or severe rash/hives, skin peeling, or any reaction on the inside of your mouth or nose? No Did you need to seek medical attention at a hospital or doctor's office? Yes When did it last happen?      30+ years If all above answers are "NO", may proceed with cephalosporin use.    Avelox [Moxifloxacin Hcl] Hives   Levofloxacin Hives    Past Medical History:  Diagnosis Date   Anxiety    GERD (gastroesophageal reflux disease)    Headache    metoprolol and topamx   Hypertension    Pneumonia    x3 had vaccine    Past Medical History, Surgical history, Social history, and Family history were reviewed and updated as appropriate.   Please see review of systems for further details on the patient's review from today.   Objective:   Physical Exam:  Wt 165 lb (74.8 kg)   BMI 28.32 kg/m   Physical Exam Constitutional:      General: She is not in acute distress. Musculoskeletal:        General: No deformity.   Neurological:     Mental Status: She is alert and oriented to person, place, and time.     Coordination: Coordination normal.  Psychiatric:        Attention and Perception: Attention and perception normal. She does not perceive auditory or visual hallucinations.        Mood and Affect: Mood is anxious. Mood is not depressed. Affect is not labile, blunt, angry or inappropriate.        Speech: Speech normal.        Behavior: Behavior normal.        Thought Content: Thought content normal. Thought content is not paranoid or delusional. Thought content does  not include homicidal or suicidal ideation. Thought content does not include homicidal or suicidal plan.        Cognition and Memory: Cognition and memory normal.        Judgment: Judgment normal.     Comments: Insight intact     Lab Review:     Component Value Date/Time   NA 136 04/20/2021 0848   K 2.8 (L) 04/20/2021 0848   CL 103 04/20/2021 0848   CO2 23 04/20/2021 0848   GLUCOSE 125 (H) 04/20/2021 0848   BUN 10 04/20/2021 0848   CREATININE 0.83 04/20/2021 0848   CALCIUM 9.1 04/20/2021 0848   PROT 7.5 08/21/2018 1503   ALBUMIN 4.4 08/21/2018 1503   AST 24 08/21/2018 1503   ALT 26 08/21/2018 1503   ALKPHOS 124 08/21/2018 1503   BILITOT 0.6 08/21/2018 1503   GFRNONAA >60 04/20/2021 0848   GFRAA >60 08/21/2018 1503       Component Value Date/Time   WBC 13.5 (H) 04/20/2021 0848   RBC 3.93 04/20/2021 0848   HGB 12.8 04/20/2021 0848   HCT 38.2 04/20/2021 0848   PLT 224 04/20/2021 0848   MCV 97.2 04/20/2021 0848   MCH 32.6 04/20/2021 0848   MCHC 33.5 04/20/2021 0848   RDW 13.2 04/20/2021 0848   LYMPHSABS 1.1 04/20/2021 0848   MONOABS 0.7 04/20/2021 0848   EOSABS 0.1 04/20/2021 0848   BASOSABS 0.1 04/20/2021 0848    No results found for: "POCLITH", "LITHIUM"   No results found for: "PHENYTOIN", "PHENOBARB", "VALPROATE", "CBMZ"   .res Assessment: Plan:   Pt seen for 30 minutes and time spent discussing residual  anxiety symptoms to include intrusive thoughts. She reports that some of the anxious thoughts may be related to stressful experiences in childhood surrounding her mother's lengthy illness with cancer. Discussed that EMDR may be helpful for processing some of these experiences and pt reports that she would be interested in starting EMDR.  Discussed considering trial of Abilify 5 mg daily to possibly improve intrusive thoughts, rumination, and catastrophic thoughts and that this is an off-label use. Pt agrees to increase in Abilify to 5 mg daily.  Will continue Sertraline 100 mg daily for anxiety and depression.  Continue Duloxetine 60 mg daily for depression.  Continue Xanax 0.5-0.75 mg at bedtime for anxiety and insomnia.  Continue Buspar 30 mg po BID for anxiety.  Pt to follow-up with this provider in 6 weeks or sooner if clinically indicated.  Patient advised to contact office with any questions, adverse effects, or acute worsening in signs and symptoms.   Caelyn was seen today for anxiety.  Diagnoses and all orders for this visit:  Generalized anxiety disorder -     busPIRone (BUSPAR) 30 MG tablet; Take 1 tablet (30 mg total) by mouth 2 (two) times daily. -     DULoxetine (CYMBALTA) 60 MG capsule; Take 1 capsule (60 mg total) by mouth daily. -     ALPRAZolam (XANAX) 0.25 MG tablet; Take 2-3 tablets at bedtime -     sertraline (ZOLOFT) 100 MG tablet; Take 1 tablet (100 mg total) by mouth daily.  Recurrent major depressive disorder, in full remission (HCC) -     ARIPiprazole (ABILIFY) 5 MG tablet; Take 1 tablet (5 mg total) by mouth daily.     Please see After Visit Summary for patient specific instructions.  Future Appointments  Date Time Provider Department Center  04/24/2022  2:00 PM Corie Chiquito, PMHNP [facility] None  05/16/2022 12:00 PM  Lina Sayre, [facility] None    No orders of the defined types were placed in this encounter.   -------------------------------

## 2022-04-08 IMAGING — CR DG CHEST 2V
2 series · 2 of 2 positions shown · non-contrast
Comparison: Radiograph 08/21/2018

CLINICAL DATA: Six months of cough

EXAM:
CHEST - 2 VIEW

[w chest pa]
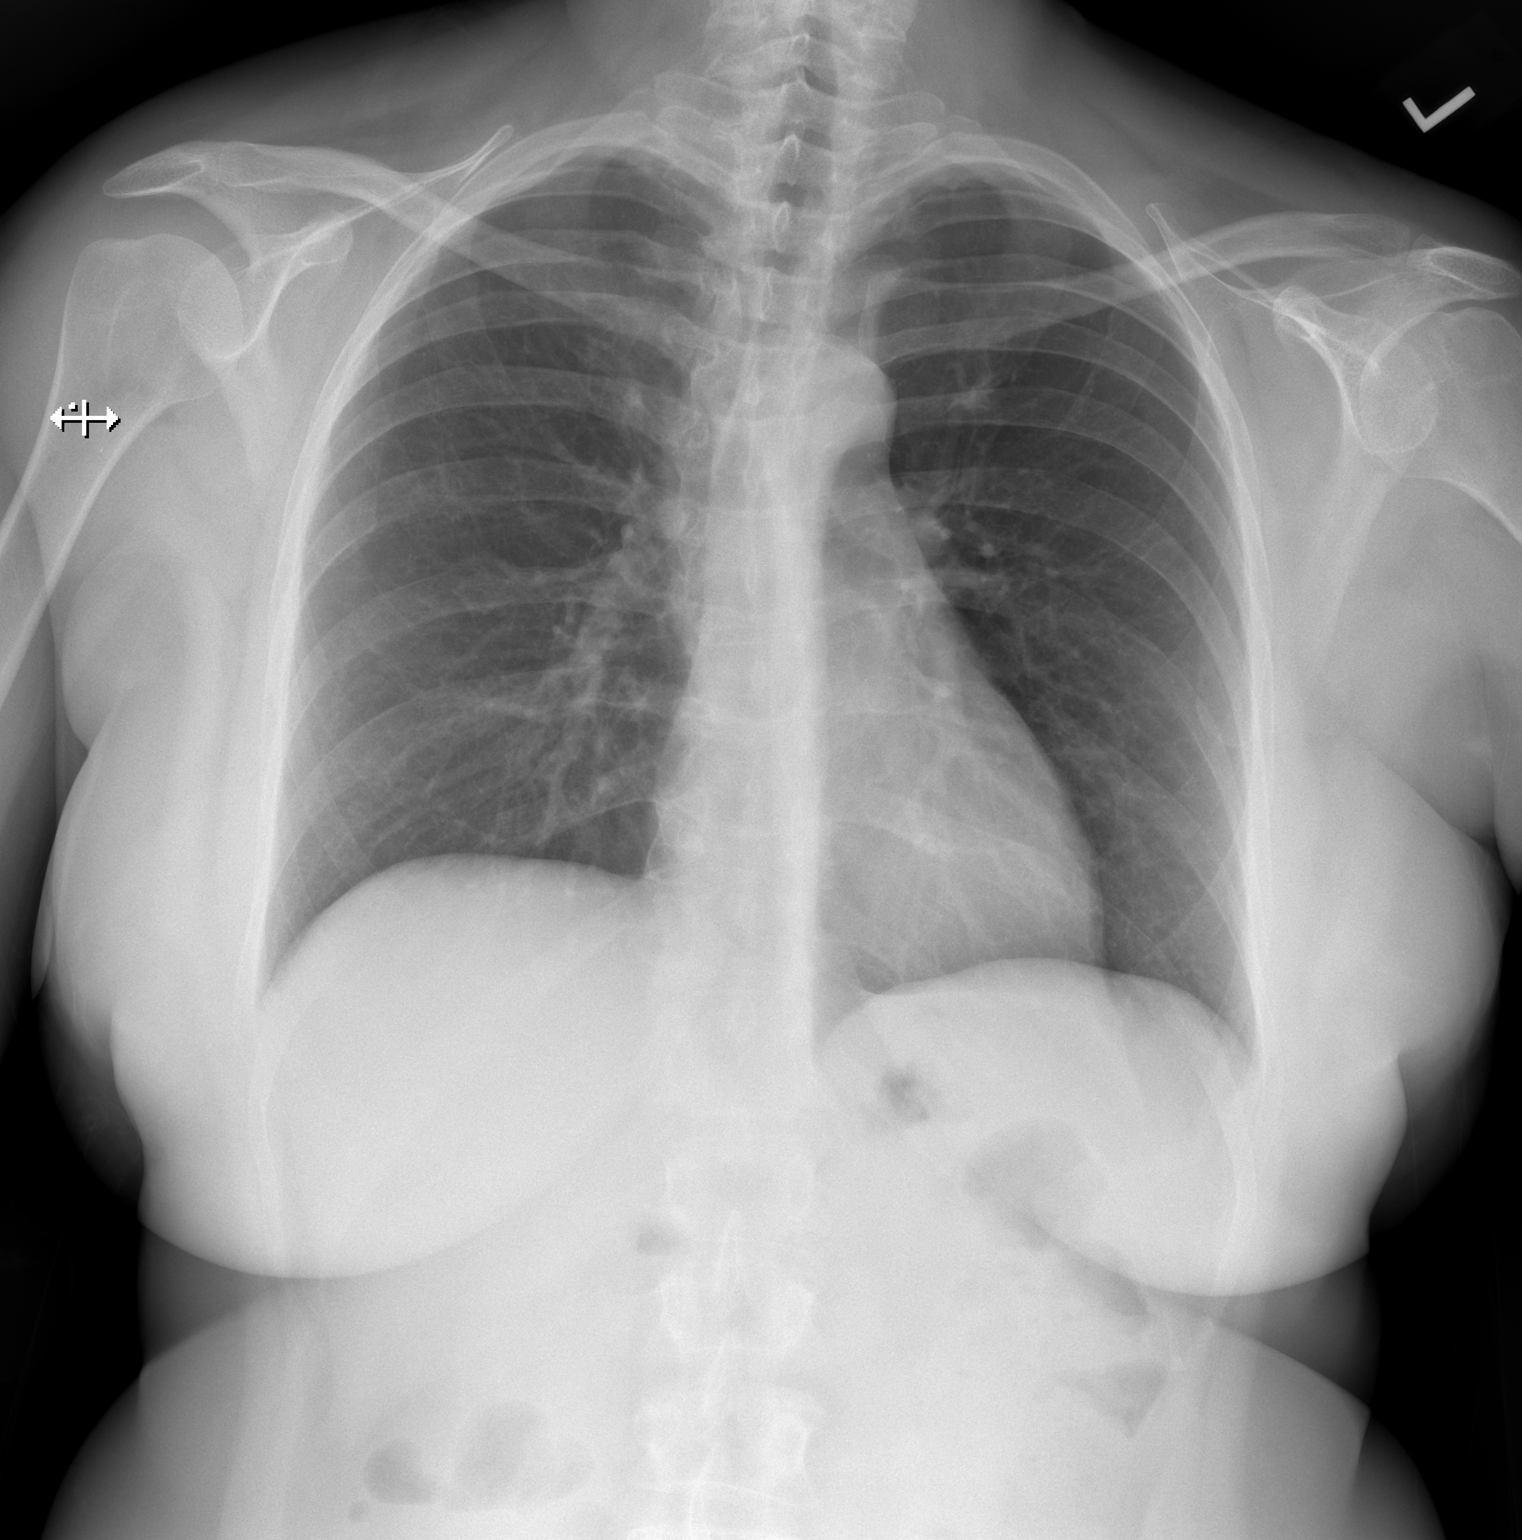

[w chest lat]
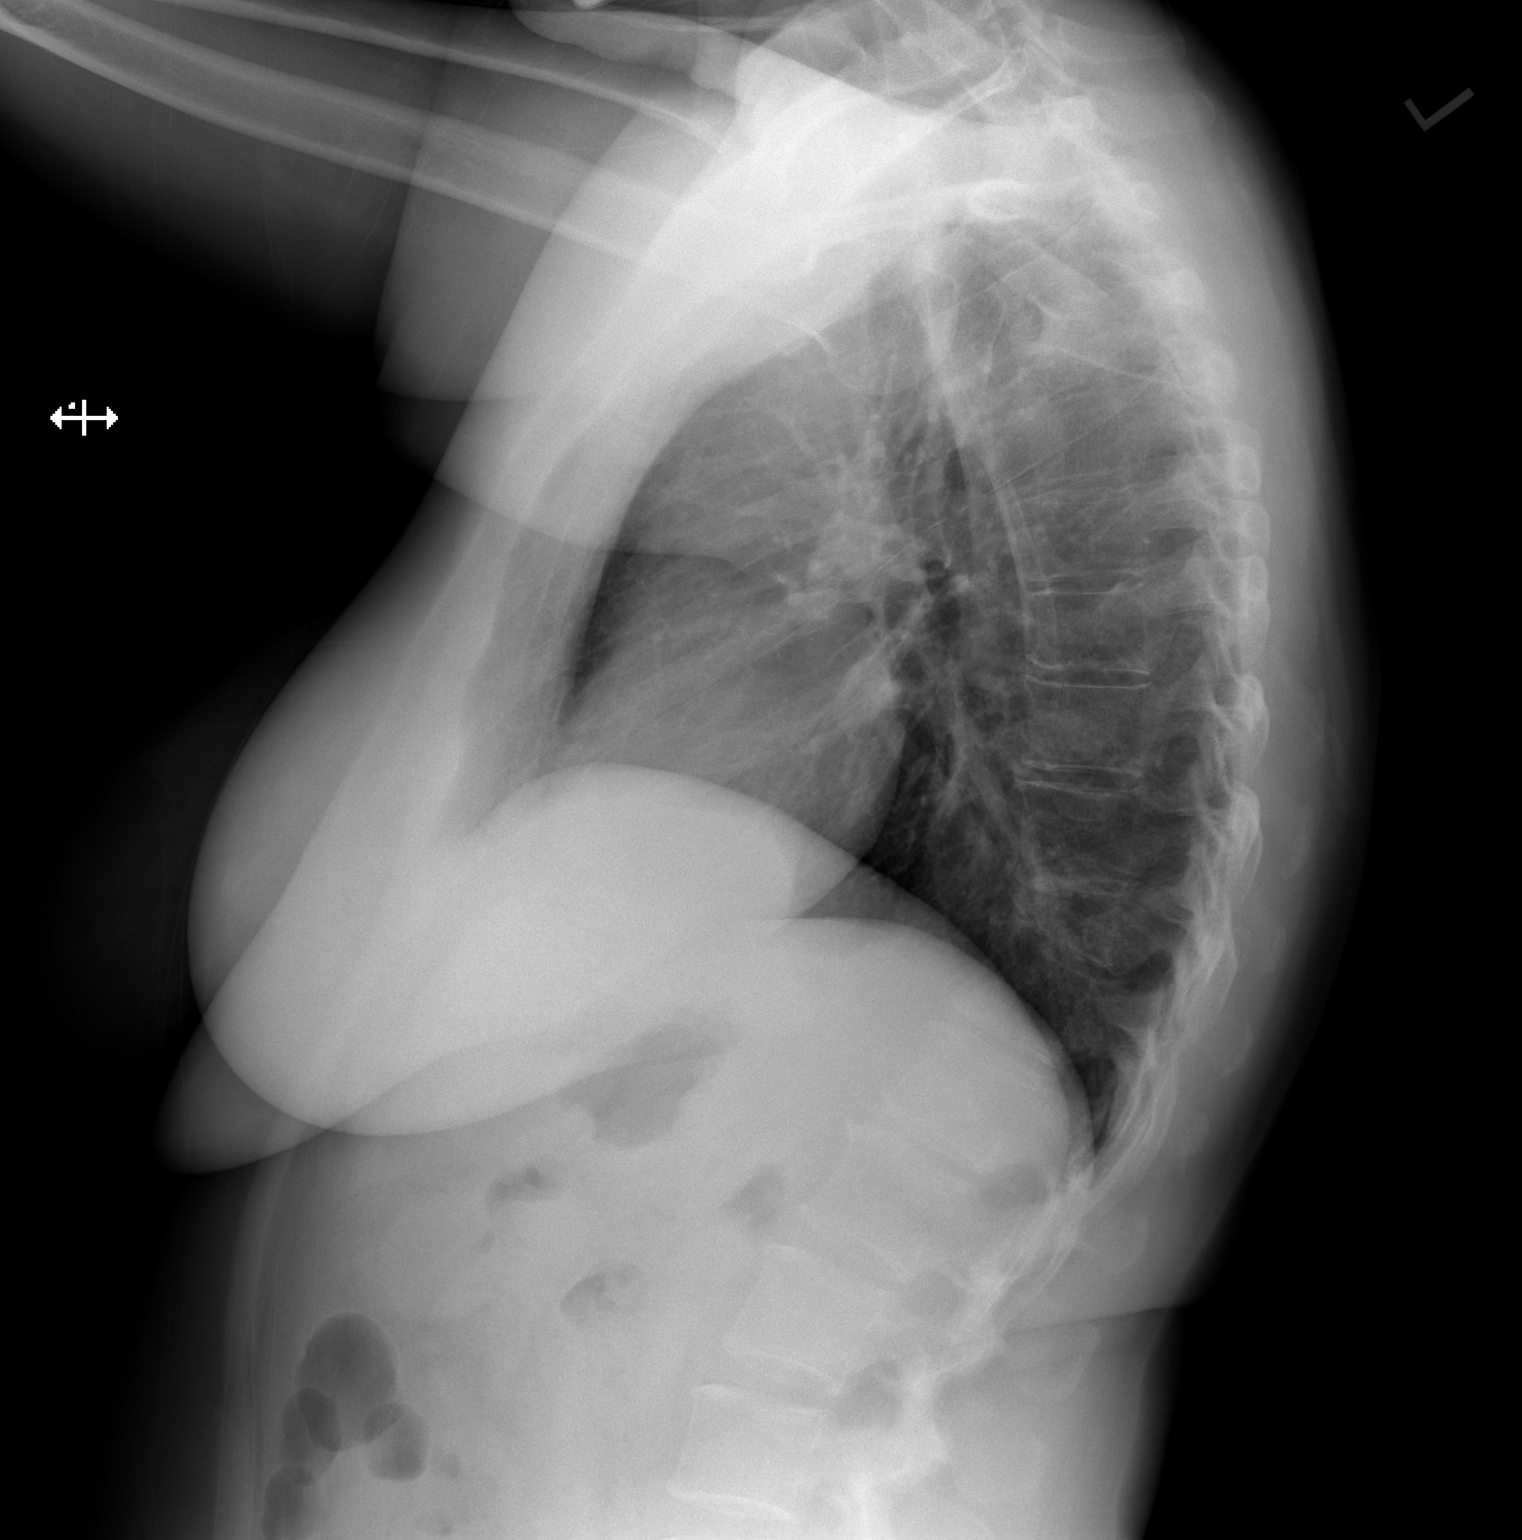

[2 of 2 positions shown; findings below may reference images not displayed]

FINDINGS: No consolidation, features of edema, pneumothorax, or effusion.
Pulmonary vascularity is normally distributed. The cardiomediastinal
contours are unremarkable. No acute osseous or soft tissue
abnormality.
IMPRESSION: No acute cardiopulmonary abnormality.

## 2022-04-23 ENCOUNTER — Encounter: Payer: Self-pay | Admitting: Psychiatry

## 2022-04-23 ENCOUNTER — Ambulatory Visit: Payer: BC Managed Care – PPO | Admitting: Psychiatry

## 2022-04-23 DIAGNOSIS — F411 Generalized anxiety disorder: Secondary | ICD-10-CM | POA: Diagnosis not present

## 2022-04-23 DIAGNOSIS — F3341 Major depressive disorder, recurrent, in partial remission: Secondary | ICD-10-CM | POA: Diagnosis not present

## 2022-04-23 DIAGNOSIS — G4700 Insomnia, unspecified: Secondary | ICD-10-CM | POA: Diagnosis not present

## 2022-04-23 DIAGNOSIS — F332 Major depressive disorder, recurrent severe without psychotic features: Secondary | ICD-10-CM

## 2022-04-23 MED ORDER — BREXPIPRAZOLE 1 MG PO TABS: 1.0000 mg | ORAL_TABLET | Freq: Every day | ORAL | 1 refills | Status: DC

## 2022-04-23 MED ORDER — TRAZODONE HCL 100 MG PO TABS: ORAL_TABLET | ORAL | 2 refills | Status: DC

## 2022-04-23 NOTE — Progress Notes (Signed)
KINLY DAUBE [id number] Sep 05, 1970 51 y.o.  Subjective:   Patient ID:  Patricia Mclean is a 51 y.o. (DOB 13-Jul-1970) female.  Chief Complaint: No chief complaint on file.   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 She has had some anxiety about "whether I make the right decision or not." Has anxiety in response to husband having eye surgery on 05/08/22. She had a panic attack when husband received paperwork for his surgery. Has had some rumination and catastrophic thoughts. Occ intrusive thoughts, ie. Will think about possibly being MVA.   She has not noticed a significant difference since increase in Abilify in terms of mood or anxiety. She reports that her weight loss has plateaued. Denies depressed mood. She has had some rumination that interferes with her sleep. She has been having anxiety about upcoming trip to see father with dementia over Thanksgiving. She reports difficulty falling asleep, even when she has taken Xanax. She is sleeping about 4-5 hours a night and then awakens Energy and motivation have been ok. Appetite has been ok. Concentration has been ok. Denies anhedonia and is looking forward to anniversary trip to the [place]. Denies SI.   Denies restlessness.   Past Psychiatric Medication Trials: Paxil-May have caused weight gain. Limited improvement. Took 20 mg.  Celexa- Denies significant improvement in mood or anxiety. Has taken for about a year.  Zoloft Cymbalta Topamax-Prescribed for migraines. No improvement on anxiety.  Alprazolam-Helpful for sleep initiation Gabapentin- Prescribed for post-operative nerve pain. Took briefly. Did not seem to improve anxiety.  Buspar- Ineffective Abilify-No improvement at 5 mg. Wt gain/unable to lose weight [place] Office Visit from 03/12/2022 in  [place] Visit from 11/02/2021 in [facility] Total Score 0 0      GAD-7    Flowsheet Row Counselor from 11/23/2020 in [facility]  Total GAD-7 Score 14      PHQ2-9    Flowsheet Row Counselor from 11/23/2020 in [facility]  PHQ-2 Total Score 3  PHQ-9 Total Score 11      Flowsheet Row ED from 04/20/2021 in [place] No Risk        Review of Systems:  Review of Systems  Musculoskeletal:  Negative for gait problem.  Neurological:  Negative for tremors.  Psychiatric/Behavioral:         Please refer to HPI    Medications: I have reviewed the patient's current medications.  Current Outpatient Medications  Medication Sig Dispense Refill  . ALPRAZolam (XANAX) 0.25 MG tablet Take 2-3 tablets at bedtime 270 tablet 0  . ARIPiprazole (ABILIFY) 5 MG tablet Take 1 tablet (5 mg total) by mouth daily. 30 tablet 2  . busPIRone (BUSPAR) 30 MG tablet Take 1 tablet (30 mg total) by mouth 2 (two) times daily. 180 tablet 1  . chlorthalidone (HYGROTON) 25 MG tablet Take 12.5 mg by mouth every morning.    . clindamycin (CLEOCIN) 150 MG capsule Take 150 mg by mouth every 6 (six) hours. Prior to dental work    . DULoxetine (CYMBALTA) 60 MG capsule Take 1 capsule (60 mg total) by mouth daily. 90 capsule 1  . famotidine (PEPCID) 40 MG tablet Take by mouth.    . fexofenadine (ALLEGRA) 180 MG tablet  Take 180 mg by mouth daily.    Marland Kitchen ibuprofen (ADVIL) 800 MG tablet Take by mouth every 6 (six) hours as needed for cramping.    . Methylcellulose, Laxative, (CITRUCEL) 500 MG TABS Take by mouth.    . metoprolol tartrate (LOPRESSOR) 50 MG tablet Take 50 mg by mouth 2 (two) times daily.    . Multiple Vitamins-Minerals (ONE-A-DAY WOMENS PO) Take by mouth.    Marland Kitchen omeprazole (PRILOSEC) 40 MG capsule Take 40 mg by mouth daily.    . ondansetron (ZOFRAN ODT) 4 MG disintegrating  tablet Take 1 tablet (4 mg total) by mouth every 8 (eight) hours as needed for nausea or vomiting. 20 tablet 0  . Potassium Chloride ER 20 MEQ TBCR Take 2 tablets by mouth 2 (two) times daily.    . rosuvastatin (CRESTOR) 20 MG tablet Take 20 mg by mouth daily.    . sertraline (ZOLOFT) 100 MG tablet Take 1 tablet (100 mg total) by mouth daily. 90 tablet 1  . sucralfate (CARAFATE) 1 g tablet Take by mouth daily as needed.    . Topiramate ER (TROKENDI XR) 100 MG CP24 Take 100 mg by mouth 2 (two) times daily.    . vitamin B-12 (CYANOCOBALAMIN) 1000 MCG tablet Take 1,000 mcg by mouth daily.     No current facility-administered medications for this visit.    Medication Side Effects: Other: Possibly interfering with weight loss  Allergies:  Allergies  Allergen Reactions  . Penicillins Anaphylaxis and Shortness Of Breath    Did it involve swelling of the face/tongue/throat, SOB, or low BP? Yes Did it involve sudden or severe rash/hives, skin peeling, or any reaction on the inside of your mouth or nose? No Did you need to seek medical attention at a hospital or doctor's office? Yes When did it last happen?      30+ years If all above answers are "NO", may proceed with cephalosporin use.   . Avelox [Moxifloxacin Hcl] Hives  . Levofloxacin Hives    Past Medical History:  Diagnosis Date  . Anxiety   . GERD (gastroesophageal reflux disease)   . Headache    metoprolol and topamx  . Hypertension   . Pneumonia    x3 had vaccine    Past Medical History, Surgical history, Social history, and Family history were reviewed and updated as appropriate.   Please see review of systems for further details on the patient's review from today.   Objective:   Physical Exam:  There were no vitals taken for this visit.  Physical Exam  Lab Review:     Component Value Date/Time   NA 136 04/20/2021 0848   K 2.8 (L) 04/20/2021 0848   CL 103 04/20/2021 0848   CO2 23 04/20/2021 0848   GLUCOSE 125  (H) 04/20/2021 0848   BUN 10 04/20/2021 0848   CREATININE 0.83 04/20/2021 0848   CALCIUM 9.1 04/20/2021 0848   PROT 7.5 08/21/2018 1503   ALBUMIN 4.4 08/21/2018 1503   AST 24 08/21/2018 1503   ALT 26 08/21/2018 1503   ALKPHOS 124 08/21/2018 1503   BILITOT 0.6 08/21/2018 1503   GFRNONAA >60 04/20/2021 0848   GFRAA >60 08/21/2018 1503       Component Value Date/Time   WBC 13.5 (H) 04/20/2021 0848   RBC 3.93 04/20/2021 0848   HGB 12.8 04/20/2021 0848   HCT 38.2 04/20/2021 0848   PLT 224 04/20/2021 0848   MCV 97.2 04/20/2021 0848   MCH 32.6 04/20/2021 0848  MCHC 33.5 04/20/2021 0848   RDW 13.2 04/20/2021 0848   LYMPHSABS 1.1 04/20/2021 0848   MONOABS 0.7 04/20/2021 0848   EOSABS 0.1 04/20/2021 0848   BASOSABS 0.1 04/20/2021 0848    No results found for: "POCLITH", "LITHIUM"   No results found for: "PHENYTOIN", "PHENOBARB", "VALPROATE", "CBMZ"   .res Assessment: Plan:    There are no diagnoses linked to this encounter.   Please see After Visit Summary for patient specific instructions.  Future Appointments  Date Time Provider Department Center  05/16/2022 12:00 PM Stevphen Meuse, [facility] None    No orders of the defined types were placed in this encounter.   -------------------------------

## 2022-04-24 ENCOUNTER — Ambulatory Visit: Payer: BC Managed Care – PPO | Admitting: Psychiatry

## 2022-04-27 ENCOUNTER — Telehealth: Payer: Self-pay | Admitting: Psychiatry

## 2022-04-27 NOTE — Telephone Encounter (Signed)
I did the initial part of PA and sent to plan.

## 2022-04-27 NOTE — Telephone Encounter (Signed)
PA submitted but she previously had a denial.

## 2022-04-27 NOTE — Telephone Encounter (Signed)
Pt called at 1:05p.  She said [facility] advised her that she needs a PA for the Rexaulti that Shanda Bumps prescribed for her earlier this week.  Pls advise status.  Next appt 1/3

## 2022-04-30 NOTE — Telephone Encounter (Signed)
Patient lvm at 2:10 stating that she received a phone call from insurance saying that they will not authorize the Rexuli. She is about to run out of samples and would like to know if she should go back to prior medication or what she should do. Pls rtc [phone]

## 2022-05-01 NOTE — Telephone Encounter (Signed)
Pulled samples for patient and let her know they are available. She has a copay card but has not tried to use it. I told her that it may be affordable for her that way, that an Rx had been sent 11/13.

## 2022-05-02 ENCOUNTER — Other Ambulatory Visit: Payer: Self-pay | Admitting: Family Medicine

## 2022-05-02 DIAGNOSIS — R922 Inconclusive mammogram: Secondary | ICD-10-CM

## 2022-05-07 ENCOUNTER — Ambulatory Visit: Payer: BC Managed Care – PPO | Admitting: Orthopaedic Surgery

## 2022-05-07 ENCOUNTER — Other Ambulatory Visit: Payer: Self-pay

## 2022-05-07 ENCOUNTER — Ambulatory Visit (INDEPENDENT_AMBULATORY_CARE_PROVIDER_SITE_OTHER): Payer: BC Managed Care – PPO

## 2022-05-07 DIAGNOSIS — M25551 Pain in right hip: Secondary | ICD-10-CM

## 2022-05-07 NOTE — Progress Notes (Signed)
The patient is somewhat of seen in the past.  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  This was done in 2020 through direct and her approach.  She has been having right hip and groin pain for a little while now.  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  She is now having worsening pain on the right side.  All of her symptoms resolved on the left side including hip bursitis.  She has significant pain in the groin with internal and external rotation of her right hip.  X-rays today show normal-appearing right hip joint but I did look again at the MRI from 2021 and it does show a small nidus of avascular necrosis and the small area that is visible of the femoral head on the right side.  This was not a dedicated right hip MRI.  I do believe that it is medically appropriate at this point to obtain a new MRI of her right hip to assess the degree of avascular necrosis that is involved with the right hip.  Based on clinical exam findings and a previous MRI of the left hip to bear that showed the right hip ball with avascular necrosis, this warrants a dedicated right hip MRI at this point.  We will see her back once we have that MRI.

## 2022-05-16 ENCOUNTER — Ambulatory Visit: Payer: BC Managed Care – PPO | Admitting: Psychiatry

## 2022-05-16 ENCOUNTER — Encounter: Payer: Self-pay | Admitting: Psychiatry

## 2022-05-16 DIAGNOSIS — F411 Generalized anxiety disorder: Secondary | ICD-10-CM | POA: Diagnosis not present

## 2022-05-16 NOTE — Progress Notes (Signed)
      [facility] Counselor/Therapist Progress Note  Patient ID: Patricia Mclean, MRN: [id number],    Date: 05/16/2022  Time Spent: 50 minutes start time 12:04 PM end time 12:54 PM  Treatment Type: Individual Therapy  Reported Symptoms: anxiety, panic, triggered responses, rumination, sleep issues, flashbacks, nightmares, hypervigilance  Mental Status Exam:  Appearance:   Well Groomed     Behavior:  Appropriate  Motor:  Normal  Speech/Language:   Normal Rate  Affect:  Appropriate  Mood:  anxious  Thought process:  normal  Thought content:    WNL  Sensory/Perceptual disturbances:    WNL  Orientation:  oriented to person, place, time/date, and situation  Attention:  Good  Concentration:  Good  Memory:  WNL  Fund of knowledge:   Good  Insight:    Good  Judgment:   Good  Impulse Control:  Good   Risk Assessment: Danger to Self:  No Self-injurious Behavior: No Danger to Others: No Duty to Warn:no Physical Aggression / Violence:No  Access to Firearms a concern: No  Gang Involvement:No   Subjective: Patient was present for session.  She shared she has had anxiety and her provider thought she would benefit from EMDR.  She shared that her mother was sick when she was a child.  She has no memories of her being well.  She passed away when she was 13 and she had to go live with family members due to dad having a nervous breakdown.  She went on to share in 2018 she lost 4 family members and in 2019 husband had 6 eye surgeries some emergency and that lead to anxiety that was debilitating anxiety. 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  Been married 25 years and he is her primary support. Her dad has dementia now and she and brother are having to take care of him.  Patient shared having lots of anxiety almost to the level of panic in session.  Had her  practice brain spotting relaxation exercise vergence she responded well in session.  Had her practice grounded and 5 exercise after that which she also felt was helpful.  Discussed other things that she can do to help calm herself including listening to that of music, journaling, counting backwards from 100 by twos or threes or trying other grounding exercises.  All exercises were written out on an index card for patient to take home and practice on a regular basis.  Interventions: Solution-Oriented/Positive Psychology  Diagnosis:   ICD-10-CM   1. Generalized anxiety disorder  F41.1       Plan: Patient is to practice coping skills to decrease anxiety and panic.  Patient is to work on listening to theta music prior to bedtime to see if it improves sleep issues.  Patient is to work on exercising to release negative emotions appropriately. Long-term goal: Resolve core conflict that is the source of anxiety-decrease nightmares and flashbacks by 75% Short-term goal: Increase being able to engage in pleasurable activities by 50%-travel, walk with dogs.    Stevphen Meuse, [facility]

## 2022-05-20 ENCOUNTER — Ambulatory Visit: Payer: BC Managed Care – PPO

## 2022-05-31 ENCOUNTER — Ambulatory Visit
Admission: RE | Admit: 2022-05-31 | Discharge: 2022-05-31 | Disposition: A | Discharge status: Home / Self Care | Payer: BC Managed Care – PPO | Source: Ambulatory Visit | Attending: Family Medicine | Admitting: Family Medicine

## 2022-05-31 DIAGNOSIS — R922 Inconclusive mammogram: Secondary | ICD-10-CM

## 2022-05-31 MED ORDER — GADOPICLENOL 0.5 MMOL/ML IV SOLN
7.0000 mL | Freq: Once | INTRAVENOUS | Status: AC | PRN
  Administered 2022-05-31: 7 mL via INTRAVENOUS

## 2022-06-07 ENCOUNTER — Ambulatory Visit
Admission: RE | Admit: 2022-06-07 | Discharge: 2022-06-07 | Disposition: A | Discharge status: Home / Self Care | Payer: BC Managed Care – PPO | Source: Ambulatory Visit | Attending: Orthopaedic Surgery | Admitting: Orthopaedic Surgery

## 2022-06-07 DIAGNOSIS — M25551 Pain in right hip: Secondary | ICD-10-CM

## 2022-06-13 ENCOUNTER — Ambulatory Visit: Payer: BC Managed Care – PPO | Admitting: Psychiatry

## 2022-06-13 ENCOUNTER — Encounter: Payer: Self-pay | Admitting: Psychiatry

## 2022-06-13 DIAGNOSIS — F411 Generalized anxiety disorder: Secondary | ICD-10-CM

## 2022-06-13 DIAGNOSIS — F331 Major depressive disorder, recurrent, moderate: Secondary | ICD-10-CM

## 2022-06-13 DIAGNOSIS — G4700 Insomnia, unspecified: Secondary | ICD-10-CM | POA: Diagnosis not present

## 2022-06-13 DIAGNOSIS — F3341 Major depressive disorder, recurrent, in partial remission: Secondary | ICD-10-CM

## 2022-06-13 DIAGNOSIS — F330 Major depressive disorder, recurrent, mild: Secondary | ICD-10-CM

## 2022-06-13 MED ORDER — DULOXETINE HCL 60 MG PO CPEP: 60.0000 mg | ORAL_CAPSULE | Freq: Every day | ORAL | 1 refills | Status: DC

## 2022-06-13 MED ORDER — ARIPIPRAZOLE 2 MG PO TABS: 2.0000 mg | ORAL_TABLET | Freq: Every day | ORAL | 1 refills | Status: DC

## 2022-06-13 MED ORDER — SERTRALINE HCL 100 MG PO TABS: 100.0000 mg | ORAL_TABLET | Freq: Every day | ORAL | 1 refills | Status: DC

## 2022-06-13 MED ORDER — ALPRAZOLAM 0.25 MG PO TABS: ORAL_TABLET | ORAL | 5 refills | Status: DC

## 2022-06-13 MED ORDER — TRAZODONE HCL 100 MG PO TABS: 100.0000 mg | ORAL_TABLET | Freq: Every evening | ORAL | 1 refills | Status: DC | PRN

## 2022-06-13 MED ORDER — BUSPIRONE HCL 30 MG PO TABS: 30.0000 mg | ORAL_TABLET | Freq: Two times a day (BID) | ORAL | 1 refills | Status: DC

## 2022-06-13 NOTE — Progress Notes (Unsigned)
Patricia Mclean [id number] 12-18-1970 52 y.o.  Subjective:   Patient ID:  Patricia Mclean is a 52 y.o. (DOB 1971/03/05) female.  Chief Complaint: No chief complaint on file.   HPI DMIYA MALPHRUS presents to the office today for follow-up of anxiety, depression, and insomnia. She reports that she had difficulty getting Rexulti through her insurance and insurance stated she had to try and fail 3 alternatives (Abilify, Seroquel, Vraylar). She has continued Rexulti samples while attempting to seek insurance approval. She reports that she has not seen a significant change between Abilify and Rexulti in terms of weight/appetite. She has had some anxious thoughts and some catastrophic thinking. She reports having less panic. Some rumination about husband's job situation. She reports that Trazodone has been helpful for sleep initiation. Energy and motivation have been good. Depression is controlled. Concentration is adequate. Denies anhedonia. Denies SI.   Husband's hours were recently reduced and some of his coworkers were laid off.   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   She saw Lina Sayre, [facility] for initial eval and has additional apts scheduled.   Alprazolam last filled 04/26/22.   Past Psychiatric Medication Trials: Paxil-May have caused weight gain. Limited improvement. Took 20 mg.  Celexa- Denies significant improvement in mood or anxiety. Has taken for about a year.  Zoloft Cymbalta Topamax-Prescribed for migraines. No improvement on anxiety.  Alprazolam-Helpful for sleep initiation Gabapentin- Prescribed for post-operative nerve pain. Took briefly. Did not seem to improve anxiety.  Buspar- Ineffective Abilify-No improvement at 5 mg. Wt gain/unable to lose weight [place] Office Visit from 04/23/2022 in [place] Office Visit from 03/12/2022 in [place] Visit from 11/02/2021 in [facility]  AIMS Total Score 0 0 0      GAD-7    Flowsheet Row Counselor from 11/23/2020 in [facility]  Total GAD-7 Score 14      PHQ2-9    Flowsheet Row Counselor from 11/23/2020 in [facility]  PHQ-2 Total Score 3  PHQ-9 Total Score 11      Flowsheet Row ED from 04/20/2021 in [place] No Risk        Review of Systems:  Review of Systems  Musculoskeletal:  Negative for gait problem.       Has upcoming MRI of hip. Having hip pain. Had hip previous hip replacement right before COVID.  Neurological:  Negative for tremors.  Psychiatric/Behavioral:         Please refer to HPI    Medications: I have reviewed the patient's current medications.  Current Outpatient Medications  Medication Sig Dispense Refill   ALPRAZolam (XANAX) 0.25 MG tablet Take 2-3 tablets at bedtime 270 tablet 0   brexpiprazole (REXULTI) 1 MG TABS tablet Take 1 tablet (1 mg total) by mouth daily. 30 tablet 1   busPIRone (BUSPAR) 30 MG tablet Take 1 tablet (30 mg total) by mouth 2 (two) times daily. 180 tablet 1   chlorthalidone (HYGROTON) 25 MG tablet Take 12.5 mg by mouth every morning.     clindamycin (CLEOCIN) 150 MG capsule Take 150 mg by mouth every 6 (six) hours. Prior to dental work     DULoxetine (CYMBALTA) 60 MG capsule Take 1 capsule (60 mg total) by mouth daily. 90 capsule 1   famotidine (PEPCID) 40 MG tablet Take  by mouth.     fexofenadine (ALLEGRA) 180 MG tablet Take 180 mg by mouth daily.     ibuprofen (ADVIL) 800 MG tablet Take by mouth every 6 (six) hours as needed for cramping.     Methylcellulose, Laxative, (CITRUCEL) 500 MG TABS Take by mouth.     metoprolol tartrate (LOPRESSOR) 50 MG tablet Take 50 mg by mouth 2 (two) times daily.     Multiple Vitamins-Minerals (ONE-A-DAY WOMENS PO) Take by mouth.     omeprazole (PRILOSEC) 40 MG capsule Take  40 mg by mouth daily.     ondansetron (ZOFRAN ODT) 4 MG disintegrating tablet Take 1 tablet (4 mg total) by mouth every 8 (eight) hours as needed for nausea or vomiting. 20 tablet 0   Potassium Chloride ER 20 MEQ TBCR Take 2 tablets by mouth 2 (two) times daily.     rosuvastatin (CRESTOR) 20 MG tablet Take 20 mg by mouth daily.     sertraline (ZOLOFT) 100 MG tablet Take 1 tablet (100 mg total) by mouth daily. 90 tablet 1   sucralfate (CARAFATE) 1 g tablet Take by mouth daily as needed.     Topiramate ER (TROKENDI XR) 100 MG CP24 Take 100 mg by mouth 2 (two) times daily.     traZODone (DESYREL) 100 MG tablet Take 1/2-1 tablet po QHS prn insomnia 30 tablet 2   vitamin B-12 (CYANOCOBALAMIN) 1000 MCG tablet Take 1,000 mcg by mouth daily.     No current facility-administered medications for this visit.    Medication Side Effects: None  Allergies:  Allergies  Allergen Reactions   Penicillins Anaphylaxis and Shortness Of Breath    Did it involve swelling of the face/tongue/throat, SOB, or low BP? Yes Did it involve sudden or severe rash/hives, skin peeling, or any reaction on the inside of your mouth or nose? No Did you need to seek medical attention at a hospital or doctor's office? Yes When did it last happen?      30+ years If all above answers are "NO", may proceed with cephalosporin use.    Avelox [Moxifloxacin Hcl] Hives   Levofloxacin Hives    Past Medical History:  Diagnosis Date   Anxiety    GERD (gastroesophageal reflux disease)    Headache    metoprolol and topamx   Hypertension    Pneumonia    x3 had vaccine    Past Medical History, Surgical history, Social history, and Family history were reviewed and updated as appropriate.   Please see review of systems for further details on the patient's review from today.   Objective:   Physical Exam:  There were no vitals taken for this visit.  Physical Exam  Lab Review:     Component Value Date/Time   NA 136  04/20/2021 0848   K 2.8 (L) 04/20/2021 0848   CL 103 04/20/2021 0848   CO2 23 04/20/2021 0848   GLUCOSE 125 (H) 04/20/2021 0848   BUN 10 04/20/2021 0848   CREATININE 0.83 04/20/2021 0848   CALCIUM 9.1 04/20/2021 0848   PROT 7.5 08/21/2018 1503   ALBUMIN 4.4 08/21/2018 1503   AST 24 08/21/2018 1503   ALT 26 08/21/2018 1503   ALKPHOS 124 08/21/2018 1503   BILITOT 0.6 08/21/2018 1503   GFRNONAA >60 04/20/2021 0848   GFRAA >60 08/21/2018 1503       Component Value Date/Time   WBC 13.5 (H) 04/20/2021 0848   RBC 3.93 04/20/2021 0848   HGB 12.8 04/20/2021 0848  HCT 38.2 04/20/2021 0848   PLT 224 04/20/2021 0848   MCV 97.2 04/20/2021 0848   MCH 32.6 04/20/2021 0848   MCHC 33.5 04/20/2021 0848   RDW 13.2 04/20/2021 0848   LYMPHSABS 1.1 04/20/2021 0848   MONOABS 0.7 04/20/2021 0848   EOSABS 0.1 04/20/2021 0848   BASOSABS 0.1 04/20/2021 0848    No results found for: "POCLITH", "LITHIUM"   No results found for: "PHENYTOIN", "PHENOBARB", "VALPROATE", "CBMZ"   .res Assessment: Plan:    There are no diagnoses linked to this encounter.   Please see After Visit Summary for patient specific instructions.  Future Appointments  Date Time Provider [place]  06/18/2022  9:45 AM Mcarthur Rossetti, MD [facility] None  06/21/2022 12:00 PM Lina Sayre, [facility] None  07/05/2022 12:00 PM Lina Sayre, [facility] None  07/19/2022 12:00 PM Lina Sayre, [facility] None  08/06/2022 10:30 AM Melvenia Beam, MD [facility] None  08/09/2022 12:00 PM Lina Sayre, [facility] None    No orders of the defined types were placed in this encounter.   -------------------------------

## 2022-06-18 ENCOUNTER — Ambulatory Visit: Payer: BC Managed Care – PPO | Admitting: Orthopaedic Surgery

## 2022-06-18 ENCOUNTER — Encounter: Payer: Self-pay | Admitting: Orthopaedic Surgery

## 2022-06-18 DIAGNOSIS — M87051 Idiopathic aseptic necrosis of right femur: Secondary | ICD-10-CM

## 2022-06-18 NOTE — Progress Notes (Signed)
The patient is a very pleasant 51 year old female well-known to me.  She has remote history of a left hip replacement by one of my colleagues in town.  She been having right hip pain off and on for some time now.  It is not bothering her today but she has had some pain and she felt like it was more compensating doing with her left hip.  She has had some chronic IT band pain and tendinitis with that left hip replacement.  We sent her for an MRI of the failure conservative treatment.  Her plain films also showed a normal-appearing right hip.  On exam today her right hip is smoothly and fluidly with no pain on extremes of rotation and no pain with compression of the hip.  She is walking with a normal gait.  The MRI of the right hip surprisingly shows significant avascular necrosis of the femoral head but no femoral head collapse.  There is no significant effusion.  There is subchondral edema.  I spoke to her in length in detail about her right hip AVN.  Since she is asymptomatic right now, we will just watch this conservatively.  However, if her plain gets persistently worse we would recommend a hip replacement.  I would like to see her back in 3 months though with a repeat standing AP pelvis and lateral of her right hip.  All questions and concerns were answered and addressed.

## 2022-06-21 ENCOUNTER — Ambulatory Visit: Payer: BC Managed Care – PPO | Admitting: Psychiatry

## 2022-06-21 DIAGNOSIS — F411 Generalized anxiety disorder: Secondary | ICD-10-CM | POA: Diagnosis not present

## 2022-06-21 NOTE — Progress Notes (Signed)
      [facility] Counselor/Therapist Progress Note  Patient ID: Patricia Mclean, MRN: [id number],    Date: 06/21/2022  Time Spent: 50 minutes start time 12:04 PM end time 12:54 PM  Treatment Type: Individual Therapy  Reported Symptoms: anxiety, triggered responses, rumination  Mental Status Exam:  Appearance:   Casual and Neat     Behavior:  Appropriate  Motor:  Normal  Speech/Language:   Normal Rate  Affect:  Appropriate  Mood:  anxious  Thought process:  normal  Thought content:    WNL  Sensory/Perceptual disturbances:    WNL  Orientation:  oriented to person, place, time/date, and situation  Attention:  Good  Concentration:  Good  Memory:  WNL  Fund of knowledge:   Good  Insight:    Good  Judgment:   Good  Impulse Control:  Good   Risk Assessment: Danger to Self:  No Self-injurious Behavior: No Danger to Others: No Duty to Warn:no Physical Aggression / Violence:No  Access to Firearms a concern: No  Gang Involvement:No   Subjective: Patient was present for session.  She shared that she has been using her tools and getting more sleep so that has helped. She shared that her stepmother has decided that when her dad needs memory care she is going to leave him and he will have to be taken care of by patient or her brother.  Patient did processing set on her stepmother stating dad may be coming to [facility] with her, suds level 6, negative cognition "I am not strong enough" felt anxiety in her temples in her jaw.  Patient was able to reduce suds level to 4 and was able to recognize that regardless she is not alone in the situation and she does not have to figure out all the details or concerns.  She was also able to recognize that the better possibility would be for him to go to [facility] and be closer to her brother.  Discussed how that is a likely possibility since her brother already has a place picked out 5 minutes from his time.  Interventions: Eye Movement  Desensitization and Reprocessing (EMDR) and Insight-Oriented  Diagnosis:   ICD-10-CM   1. Generalized anxiety disorder  F41.1       Plan:  Patient is to practice coping skills to decrease anxiety and panic.  Patient is to continue allowing processing to continue over the next few weeks.  Patient is to work on listening to theta music prior to bedtime to see if it improves sleep issues.  Patient is to work on exercising to release negative emotions appropriately. Long-term goal: Resolve core conflict that is the source of anxiety-decrease nightmares and flashbacks by 75% Short-term goal: Increase being able to engage in pleasurable activities by 50%-travel, walk with dogs  Lina Sayre, [facility]

## 2022-07-05 ENCOUNTER — Ambulatory Visit: Payer: BC Managed Care – PPO | Admitting: Psychiatry

## 2022-07-05 DIAGNOSIS — F411 Generalized anxiety disorder: Secondary | ICD-10-CM | POA: Diagnosis not present
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
## 2022-07-19 ENCOUNTER — Ambulatory Visit: Payer: BC Managed Care – PPO | Admitting: Psychiatry

## 2022-07-19 DIAGNOSIS — F411 Generalized anxiety disorder: Secondary | ICD-10-CM | POA: Diagnosis not present
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
## 2022-08-06 ENCOUNTER — Ambulatory Visit: Payer: BC Managed Care – PPO | Admitting: Neurology

## 2022-08-06 ENCOUNTER — Other Ambulatory Visit: Payer: Self-pay | Admitting: *Deleted

## 2022-08-06 ENCOUNTER — Encounter: Payer: Self-pay | Admitting: Neurology

## 2022-08-06 VITALS — BP 102/69 | HR 77 | Ht 64.0 in | Wt 175.0 lb

## 2022-08-06 DIAGNOSIS — G43709 Chronic migraine without aura, not intractable, without status migrainosus: Secondary | ICD-10-CM | POA: Diagnosis not present

## 2022-08-06 MED ORDER — QULIPTA 60 MG PO TABS: 30.0000 mg | ORAL_TABLET | Freq: Every day | ORAL | 0 refills | Status: DC

## 2022-08-06 MED ORDER — UBRELVY 100 MG PO TABS: 100.0000 mg | ORAL_TABLET | ORAL | 0 refills | Status: DC | PRN

## 2022-08-06 MED ORDER — METHYLPREDNISOLONE 4 MG PO TBPK: ORAL_TABLET | ORAL | 1 refills | Status: DC

## 2022-08-06 MED ORDER — CYCLOBENZAPRINE HCL 10 MG PO TABS: 10.0000 mg | ORAL_TABLET | Freq: Every day | ORAL | 3 refills | Status: DC

## 2022-08-06 MED ORDER — NURTEC 75 MG PO TBDP: 75.0000 mg | ORAL_TABLET | Freq: Every day | ORAL | 0 refills | Status: DC | PRN

## 2022-08-06 NOTE — Progress Notes (Unsigned)
[facility]    Provider:  Dr Jaynee Eagles Requesting Provider: Derinda Late, MD Primary Care Provider:  Derinda Late, MD  CC:  Migraines and daily headaches  HPI:  Patricia Mclean is a 52 y.o. female here as requested by Derinda Late, MD for migraines and daily headaches. PMHx chronic migraies, AVN left hip and femoral head, hypothyroidism, Mild intermittent asthma, anxiety, OCD, depression, insomnia, she is followed at [facility] by Thayer Headings who manages her medications and Lina Sayre for psychotherapy.  Per Dr. Deboraha Sprang notes, she also has GERD, atopic dermatitis, irritable bowel syndrome with constipation diagnosed in 2018, chronic gastritis diagnosis 2018, hypertension since 2020, vitamin B12 deficiency with an associated peripheral neuropathy since 2021, hypercholesterolemia since 2021.  I reviewed Dr. Deboraha Sprang notes, she has a history of severe chronic headaches for many years and they have often been frequent and difficult to control.  She improved somewhat in 2017 on prophylaxis with metoprolol and topiramate and as needed Ella triptan but over the last 10 months her headaches have increased from 67-monthup to 6 a month and are no longer responding to the triptan.  In the past she is required episodic use of prednisone for few days to break migraine cycle and can be fairly persistent and severe.  No aura.  Usually bitemporal and bifrontal region.  She has not responded to sumatriptan or almotriptan.  In 2017 MRI was unremarkable.  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  I reviewed Dr. BDeboraha Spranggeneral and neurologic exam which were extensive and thorough and normal except for tenderness over the left  greater trochanter.  She supplements with B12.  She is here alsone ane reports Daily headaches. Migraines are moderate to severe >15 day a month. They last 24-72 hours. She has had to miss work and significantly affecting life. Flying is a trigger for a migraine. Started in HS having migraines, mother had terrible migraines and grandmother. Too much caffeine is a trigger. Alcohol is a trigger. She is a bad clencher, sunlight. Migraines are behind the eyes and eyeballs, pulsating/throbbing. Photo/phonophobia, nausea, no vomiting, no aura, no medication overuse, migraines are moderate to severe can make her leave work and disrupt family life. Moving makes it hurt, a dark quiet still room helps. Ongoing for > 1 year at this severity and frequencya nd she has tried multiple medications. No other focal neurologic deficits, associated symptoms, inciting events or modifiable factors.  Reviewed notes, labs and imaging from outside physicians, which showed:  CBC was unremarkable, CMP was also unremarkable with BUN 14 and creatinine 1.74 except for elevated ALT 76 and elevated AST 61, TSH 4.62, hemoglobin A1c 5.0, he is aware of her hypothyroidism and is managing that, labs were drawn April 01, 2022, vitamin D 36.2,  I reviewed MRI  images of the brain with and without contrast November 18, 2015 which was normal.  From a thorough review of records, medications tried that can be used in migraine and headache management include: Metoprolol, topiramate, amitriptyline, cymbalta, zofran, relpax, imitrex, zofran, qulipta, aimovig contraindicated due to constipation, almotriptan, Ella triptan.    Review of Systems: Patient complains of symptoms per HPI as well as the following symptoms migraine, constipation and nausea, arthralgias, headaches, anxiety, sleep  disturbance and OCD. Pertinent negatives and positives per HPI. All others negative.   Social History   Socioeconomic History   Marital status: Married     Spouse name: Not on file   Number of children: Not on file   Years of education: Not on file   Highest education level: Not on file  Occupational History   Not on file  Tobacco Use   Smoking status: Never   Smokeless tobacco: Never  Vaping Use   Vaping Use: Never used  Substance and Sexual Activity   Alcohol use: Yes    Alcohol/week: 1.0 standard drink of alcohol    Types: 1 Glasses of wine per week    Comment: occasional  wine with dinner   Drug use: Never   Sexual activity: Yes  Other Topics Concern   Not on file  Social History Narrative   Not on file   Social Determinants of Health   Financial Resource Strain: Low Risk  (08/22/2018)   Overall Financial Resource Strain (CARDIA)    Difficulty of Paying Living Expenses: Not very hard  Food Insecurity: No Food Insecurity (08/22/2018)   Hunger Vital Sign    Worried About Running Out of Food in the Last Year: Never true    Ran Out of Food in the Last Year: Never true  Transportation Needs: No Transportation Needs (08/22/2018)   PRAPARE - Hydrologist (Medical): No    Lack of Transportation (Non-Medical): No  Physical Activity: Unknown (08/22/2018)   Exercise Vital Sign    Days of Exercise per Week: 2 days    Minutes of Exercise per Session: Not on file  Stress: No Stress Concern Present (08/22/2018)   [place]    Feeling of Stress : Only a little  Social Connections: Unknown (08/22/2018)   Social Connection and Isolation Panel [NHANES]    Frequency of Communication with Friends and Family: More than three times a week    Frequency of Social Gatherings with Friends and Family: More than three times a week    Attends Religious Services: Not on file    Active Member of Clubs or Organizations: Yes    Attends Archivist Meetings: Never    Marital Status: Married  Human resources officer Violence: Not At Risk (08/22/2018)    Humiliation, Afraid, Rape, and Kick questionnaire    Fear of Current or Ex-Partner: No    Emotionally Abused: No    Physically Abused: No    Sexually Abused: No    Family History  Problem Relation Age of Onset   Breast cancer Mother    Ovarian cancer Mother    Stroke Mother    Migraines Mother    Dementia Father    Depression Brother    Anxiety disorder Brother    Migraines Paternal Grandmother     Past Medical History:  Diagnosis Date   Anxiety    GERD (gastroesophageal reflux disease)    Headache    metoprolol and topamx   Hypertension    Pneumonia    x3 had vaccine    Patient Active Problem List   Diagnosis Date Noted   Chronic migraine without aura without status migrainosus, not intractable 08/06/2022   Avascular necrosis of hip, left ([place]) 08/22/2018   Avascular necrosis of femoral head, left ([place]) 08/22/2018    Past Surgical History:  Procedure Laterality Date   TONSILLECTOMY     TOTAL HIP ARTHROPLASTY Left 08/22/2018  Procedure: TOTAL HIP ARTHROPLASTY ANTERIOR APPROACH;  Surgeon: Dorna Leitz, MD;  Location: [facility];  Service: Orthopedics;  Laterality: Left;    Current Outpatient Medications  Medication Sig Dispense Refill   Atogepant (QULIPTA) 60 MG TABS Take 0.5-1 tablets (30-60 mg total) by mouth at bedtime. 28 tablet 0   busPIRone (BUSPAR) 30 MG tablet Take 1 tablet (30 mg total) by mouth 2 (two) times daily. 180 tablet 1   chlorthalidone (HYGROTON) 25 MG tablet Take 12.5 mg by mouth every morning.     DULoxetine (CYMBALTA) 60 MG capsule Take 1 capsule (60 mg total) by mouth daily. 90 capsule 1   eletriptan (RELPAX) 40 MG tablet Take 40 mg by mouth as needed for migraine or headache. Pt takes 20 mg as needed     famotidine (PEPCID) 40 MG tablet Take by mouth.     fexofenadine (ALLEGRA) 180 MG tablet Take 180 mg by mouth as needed.     FIBER PO Take by mouth.     Fremanezumab-vfrm (AJOVY) 225 MG/1.5ML SOAJ Inject 225 mg into the skin every 30 (thirty)  days. Pls run copay card: BIN [id number] PCN CN Group [id number] ID [id number] expires 06/11/2023 1.5 mL 11   ibuprofen (ADVIL) 800 MG tablet Take by mouth as needed for cramping.     Methylcellulose, Laxative, (CITRUCEL) 500 MG TABS Take by mouth.     methylPREDNISolone (MEDROL DOSEPAK) 4 MG TBPK tablet Take pills daily all together with food. Take the first dose (6 pills) as soon as possible. Take the rest each morning. For 6 days total 6-5-4-3-2-1. 21 tablet 1   metoprolol tartrate (LOPRESSOR) 50 MG tablet Take 50 mg by mouth 2 (two) times daily.     Multiple Vitamins-Minerals (ONE-A-DAY WOMENS PO) Take by mouth.     omeprazole (PRILOSEC) 40 MG capsule Take 40 mg by mouth daily.     ondansetron (ZOFRAN ODT) 4 MG disintegrating tablet Take 1 tablet (4 mg total) by mouth every 8 (eight) hours as needed for nausea or vomiting. 20 tablet 0   ondansetron (ZOFRAN) 8 MG tablet Take by mouth as needed for nausea or vomiting.     Potassium Chloride ER 20 MEQ TBCR Take 2 tablets by mouth 2 (two) times daily.     Rimegepant Sulfate (NURTEC) 75 MG TBDP Take 1 tablet (75 mg total) by mouth daily as needed. For migraines. Take as close to onset of migraine as possible. One daily maximum. 8 tablet 0   rosuvastatin (CRESTOR) 20 MG tablet Take 20 mg by mouth daily.     sertraline (ZOLOFT) 100 MG tablet Take 1 tablet (100 mg total) by mouth daily. 90 tablet 1   Topiramate ER (TROKENDI XR) 100 MG CP24 Take 100 mg by mouth 2 (two) times daily.     traZODone (DESYREL) 100 MG tablet Take 1 tablet (100 mg total) by mouth at bedtime as needed for sleep. Take 1/2-1 tablet po QHS prn insomnia 90 tablet 1   Ubrogepant (UBRELVY) 100 MG TABS Take 1 tablet (100 mg total) by mouth every 2 (two) hours as needed. Maximum '200mg'$  a day. 10 tablet 0   vitamin B-12 (CYANOCOBALAMIN) 1000 MCG tablet Take 1,000 mcg by mouth daily.     cyclobenzaprine (FLEXERIL) 10 MG tablet Take 1 tablet (10 mg total) by mouth at bedtime. 90 tablet 3    No current facility-administered medications for this visit.    Allergies as of 08/06/2022 - Review Complete 08/06/2022  Allergen Reaction Noted  Penicillins Anaphylaxis and Shortness Of Breath 06/29/2006   Avelox [moxifloxacin hcl] Hives 08/21/2018   Levofloxacin Hives 06/29/2006    Vitals: BP 102/69   Pulse 77   Ht '5\' 4"'$  (1.626 m)   Wt 175 lb (79.4 kg)   BMI 30.04 kg/m  Last Weight:  Wt Readings from Last 1 Encounters:  08/06/22 175 lb (79.4 kg)   Last Height:   Ht Readings from Last 1 Encounters:  08/06/22 '5\' 4"'$  (1.626 m)     Physical exam: Exam: Gen: NAD, conversant, well nourised, obese, well groomed                     CV: RRR, no MRG. No Carotid Bruits. No peripheral edema, warm, nontender Eyes: Conjunctivae clear without exudates or hemorrhage  Neuro: Detailed Neurologic Exam  Speech:    Speech is normal; fluent and spontaneous with normal comprehension.  Cognition:    The patient is oriented to person, place, and time;     recent and remote memory intact;     language fluent;     normal attention, concentration,     fund of knowledge Cranial Nerves:    The pupils are equal, round, and reactive to light. The fundi are normal and spontaneous venous pulsations are present. Visual fields are full to finger confrontation. Extraocular movements are intact. Trigeminal sensation is intact and the muscles of mastication are normal. The face is symmetric. The palate elevates in the midline. Hearing intact. Voice is normal. Shoulder shrug is normal. The tongue has normal motion without fasciculations.   Coordination:    Normal finger to nose and heel to shin. Normal rapid alternating movements.   Gait:    Heel-toe and tandem gait are normal.   Motor Observation:    No asymmetry, no atrophy, and no involuntary movements noted. Tone:    Normal muscle tone.    Posture:    Posture is normal. normal erect    Strength:    Strength is V/V in the upper and  lower limbs.      Sensation: intact to LT     Reflex Exam:  DTR's:    Deep tendon reflexes in the upper and lower extremities are normal bilaterally.   Toes:    The toes are downgoing bilaterally.   Clonus:    Clonus is absent.    Assessment/Plan: This is a very lovely patient with chronic migraines for many years often frequent and difficult to control she has tried multiple medications.  She continues to have migraines without aura.  We had a long discussion today on options, the new medications, lifestyle which she has already examined, diet which she has already examined, we decided to start her on one of the new CGRP medications.  I also had a discussion with her about nonpharmacological options, see below.  Preventative: Start Ajovy: once a month injection. In the meantime [place] you with Qulipta(Atogepant) for a month until we get Ajovy. Your insurance might like Em[employer], [place] is great a well, then we just switch.  Acute/Emergent: Nurtec as needed. Lasts very long so can take before a flight or at bedtime etc. Once a day as needed.  Roselyn Meier: Please take one tablet at the onset of your headache. If it does not improve the symptoms please take one additional tablet in 2 hours  Could consider Transmagnetic Stimulation ([place] [facility] ) Botox for migrianes - or in the masseters for her bruxism, discussed Clenching - flexeril at bedtime,  consider botox in the masseters   Meds ordered this encounter  Medications   methylPREDNISolone (MEDROL DOSEPAK) 4 MG TBPK tablet    Sig: Take pills daily all together with food. Take the first dose (6 pills) as soon as possible. Take the rest each morning. For 6 days total 6-5-4-3-2-1.    Dispense:  21 tablet    Refill:  1   DISCONTD: cyclobenzaprine (FLEXERIL) 10 MG tablet    Sig: Take 1 tablet (10 mg total) by mouth at bedtime.    Dispense:  90 tablet    Refill:  3   Atogepant (QULIPTA) 60 MG TABS    Sig: Take 0.5-1 tablets  (30-60 mg total) by mouth at bedtime.    Dispense:  28 tablet    Refill:  0   Rimegepant Sulfate (NURTEC) 75 MG TBDP    Sig: Take 1 tablet (75 mg total) by mouth daily as needed. For migraines. Take as close to onset of migraine as possible. One daily maximum.    Dispense:  8 tablet    Refill:  0   Ubrogepant (UBRELVY) 100 MG TABS    Sig: Take 1 tablet (100 mg total) by mouth every 2 (two) hours as needed. Maximum '200mg'$  a day.    Dispense:  10 tablet    Refill:  0   Fremanezumab-vfrm (AJOVY) 225 MG/1.5ML SOAJ    Sig: Inject 225 mg into the skin every 30 (thirty) days. Pls run copay card: BIN [id number] PCN CN Group [id number] ID [id number] expires 06/11/2023    Dispense:  1.5 mL    Refill:  11    Pls run copay card: BIN [id number] PCN CN Group [id number] ID [id number] expires 06/11/2023   Non pharmaceutical treatments for migraines  Cefaly    Nerivio     gammaCore SapphireTM is the first and only FDA cleared non-invasive device to treat and prevent multiple types of headache pain via the vagus nerve. It's small, handheld and portable for quick and easy treatments whenever and wherever needed.     E-Neura - https://fuller.com/     Fascial release tools    Cc: Derinda Late, MD,  Derinda Late, MD  Sarina Ill, MD  [facility] [address] [place], [place] [address]  Phone [phone] Fax [phone]  I spent over 60 minutes of face-to-face and non-face-to-face time with patient on the  1. Chronic migraine without aura without status migrainosus, not intractable    diagnosis.  This included previsit chart review, lab review, study review, order entry, electronic health record documentation, patient education on the different diagnostic and therapeutic options, counseling and coordination of care, risks and benefits of management, compliance, or risk factor reduction

## 2022-08-06 NOTE — Patient Instructions (Addendum)
Preventative: Start Ajovy: once a month injection. In the meantime Bridge you with Qulipta(Atogepant) for a month until we get Ajovy. Your insurance might like [employer], [place] is great a well, then we just switch.  Acute/Emergent: Nurtec as needed. Lasts very long so can take before a flight or at bedtime etc. Once a day as needed.  Roselyn Meier: Please take one tablet at the onset of your headache. If it does not improve the symptoms please take one additional tablet in 2 hours  Could consider Transmagnetic Stimulation ([place] [facility] ) Botox for migrianes - or in the masseters Clenching - flexeril at bedtime, consider botox in the masseters   Atogepant Tablets What is this medication? ATOGEPANT (a TOE je pant) prevents migraines. It works by blocking a substance in the body that causes migraines. This medicine may be used for other purposes; ask your health care provider or pharmacist if you have questions. COMMON BRAND NAME(S): QULIPTA What should I tell my care team before I take this medication? They need to know if you have any of these conditions: Kidney disease Liver disease An unusual or allergic reaction to atogepant, other medications, foods, dyes, or preservatives Pregnant or trying to get pregnant Breast-feeding How should I use this medication? Take this medication by mouth with water. Take it as directed on the prescription label at the same time every day. You can take it with or without food. If it upsets your stomach, take it with food. Keep taking it unless your care team tells you to stop. Talk to your care team about the use of this medication in children. Special care may be needed. Overdosage: If you think you have taken too much of this medicine contact a poison control center or emergency room at once. NOTE: This medicine is only for you. Do not share this medicine with others. What if I miss a dose? If you miss a dose, take it as soon as you can. If it is  almost time for your next dose, take only that dose. Do not take double or extra doses. What may interact with this medication? Carbamazepine Certain medications for fungal infections, such as itraconazole, ketoconazole Clarithromycin Cyclosporine Efavirenz Etravirine Phenytoin Rifampin St. John's wort This list may not describe all possible interactions. Give your health care provider a list of all the medicines, herbs, non-prescription drugs, or dietary supplements you use. Also tell them if you smoke, drink alcohol, or use illegal drugs. Some items may interact with your medicine. What should I watch for while using this medication? Visit your care team for regular checks on your progress. Tell your care team if your symptoms do not start to get better or if they get worse. What side effects may I notice from receiving this medication? Side effects that you should report to your care team as soon as possible: Allergic reactions--skin rash, itching, hives, swelling of the face, lips, tongue, or throat Side effects that usually do not require medical attention (report to your care team if they continue or are bothersome): Constipation Fatigue Loss of appetite with weight loss Nausea This list may not describe all possible side effects. Call your doctor for medical advice about side effects. You may report side effects to FDA at 1-800-FDA-1088. Where should I keep my medication? Keep out of the reach of children and pets. Store at room temperature between 20 and 25 degrees C (68 and 77 degrees F). Get rid of any unused medication after the expiration date. To get  rid of medications that are no longer needed or have expired: Take the medication to a medication take-back program. Check with your pharmacy or law enforcement to find a location. If you cannot return the medication, check the label or package insert to see if the medication should be thrown out in the garbage or flushed down  the toilet. If you are not sure, ask your care team. If it is safe to put it in the trash, take the medication out of the container. Mix the medication with cat litter, dirt, coffee grounds, or other unwanted substance. Seal the mixture in a bag or container. Put it in the trash. NOTE: This sheet is a summary. It may not cover all possible information. If you have questions about this medicine, talk to your doctor, pharmacist, or health care provider.  2023 Elsevier/Gold Standard (2021-06-13 00:00:00)  Ubrogepant Tablets What is this medication? UBROGEPANT (ue BROE je pant) treats migraines. It works by blocking a substance in the body that causes migraines. It is not used to prevent migraines. This medicine may be used for other purposes; ask your health care provider or pharmacist if you have questions. COMMON BRAND NAME(S): Roselyn Meier What should I tell my care team before I take this medication? They need to know if you have any of these conditions: Kidney disease Liver disease An unusual or allergic reaction to ubrogepant, other medications, foods, dyes, or preservatives Pregnant or trying to get pregnant Breast-feeding How should I use this medication? Take this medication by mouth with a glass of water. Take it as directed on the prescription label. You can take it with or without food. If it upsets your stomach, take it with food. Keep taking it unless your care team tells you to stop. Talk to your care team about the use of this medication in children. Special care may be needed. Overdosage: If you think you have taken too much of this medicine contact a poison control center or emergency room at once. NOTE: This medicine is only for you. Do not share this medicine with others. What if I miss a dose? This does not apply. This medication is not for regular use. What may interact with this medication? Do not take this medication with any of the following: Adagrasib Ceritinib Certain  antibiotics, such as chloramphenicol, clarithromycin, telithromycin Certain antivirals for HIV, such as atazanavir, cobicistat, darunavir, delavirdine, fosamprenavir, indinavir, ritonavir Certain medications for fungal infections, such as itraconazole, ketoconazole, posaconazole, voriconazole Conivaptan Grapefruit Idelalisib Mifepristone Nefazodone Ribociclib This medication may also interact with the following: Carvedilol Certain medications for seizures, such as phenobarbital, phenytoin Ciprofloxacin Cyclosporine Eltrombopag Fluconazole Fluvoxamine Quinidine Rifampin St. John's wort Verapamil This list may not describe all possible interactions. Give your health care provider a list of all the medicines, herbs, non-prescription drugs, or dietary supplements you use. Also tell them if you smoke, drink alcohol, or use illegal drugs. Some items may interact with your medicine. What should I watch for while using this medication? Visit your care team for regular checks on your progress. Tell your care team if your symptoms do not start to get better or if they get worse. Your mouth may get dry. Chewing sugarless gum or sucking hard candy and drinking plenty of water may help. Contact your care team if the problem does not go away or is severe. What side effects may I notice from receiving this medication? Side effects that you should report to your care team as soon as possible: Allergic reactions--skin rash,  itching, hives, swelling of the face, lips, tongue, or throat Side effects that usually do not require medical attention (report to your care team if they continue or are bothersome): Drowsiness Dry mouth Fatigue Nausea This list may not describe all possible side effects. Call your doctor for medical advice about side effects. You may report side effects to FDA at 1-800-FDA-1088. Where should I keep my medication? Keep out of the reach of children and pets. Store between 15  and 30 degrees C (59 and 86 degrees F). Get rid of any unused medication after the expiration date. To get rid of medications that are no longer needed or have expired: Take the medication to a medication take-back program. Check with your pharmacy or law enforcement to find a location. If you cannot return the medication, check the label or package insert to see if the medication should be thrown out in the garbage or flushed down the toilet. If you are not sure, ask your care team. If it is safe to put it in the trash, pour the medication out of the container. Mix the medication with cat litter, dirt, coffee grounds, or other unwanted substance. Seal the mixture in a bag or container. Put it in the trash. NOTE: This sheet is a summary. It may not cover all possible information. If you have questions about this medicine, talk to your doctor, pharmacist, or health care provider.  2023 Elsevier/Gold Standard (2021-07-06 00:00:00) Rimegepant Disintegrating Tablets What is this medication? RIMEGEPANT (ri ME je pant) prevents and treats migraines. It works by blocking a substance in the body that causes migraines. This medicine may be used for other purposes; ask your health care provider or pharmacist if you have questions. COMMON BRAND NAME(S): NURTEC ODT What should I tell my care team before I take this medication? They need to know if you have any of these conditions: Kidney disease Liver disease An unusual or allergic reaction to rimegepant, other medications, foods, dyes, or preservatives Pregnant or trying to get pregnant Breast-feeding How should I use this medication? Take this medication by mouth. Take it as directed on the prescription label. Leave the tablet in the sealed pack until you are ready to take it. With dry hands, open the pack and gently remove the tablet. If the tablet breaks or crumbles, throw it away. Use a new tablet. Place the tablet in the mouth and allow it to dissolve.  Then, swallow it. Do not cut, crush, or chew this medication. You do not need water to take this medication. Talk to your care team about the use of this medication in children. Special care may be needed. Overdosage: If you think you have taken too much of this medicine contact a poison control center or emergency room at once. NOTE: This medicine is only for you. Do not share this medicine with others. What if I miss a dose? This does not apply. This medication is not for regular use. What may interact with this medication? Certain medications for fungal infections, such as fluconazole, itraconazole Rifampin This list may not describe all possible interactions. Give your health care provider a list of all the medicines, herbs, non-prescription drugs, or dietary supplements you use. Also tell them if you smoke, drink alcohol, or use illegal drugs. Some items may interact with your medicine. What should I watch for while using this medication? Visit your care team for regular checks on your progress. Tell your care team if your symptoms do not start to get  better or if they get worse. What side effects may I notice from receiving this medication? Side effects that you should report to your care team as soon as possible: Allergic reactions--skin rash, itching, hives, swelling of the face, lips, tongue, or throat Side effects that usually do not require medical attention (report to your care team if they continue or are bothersome): Nausea Stomach pain This list may not describe all possible side effects. Call your doctor for medical advice about side effects. You may report side effects to FDA at 1-800-FDA-1088. Where should I keep my medication? Keep out of the reach of children and pets. Store at room temperature between 20 and 25 degrees C (68 and 77 degrees F). Get rid of any unused medication after the expiration date. To get rid of medications that are no longer needed or have  expired: Take the medication to a medication take-back program. Check with your pharmacy or law enforcement to find a location. If you cannot return the medication, check the label or package insert to see if the medication should be thrown out in the garbage or flushed down the toilet. If you are not sure, ask your care team. If it is safe to put it in the trash, take the medication out of the container. Mix the medication with cat litter, dirt, coffee grounds, or other unwanted substance. Seal the mixture in a bag or container. Put it in the trash. NOTE: This sheet is a summary. It may not cover all possible information. If you have questions about this medicine, talk to your doctor, pharmacist, or health care provider.  2023 Elsevier/Gold Standard (2021-07-19 00:00:00) Galcanezumab Injection What is this medication? GALCANEZUMAB (gal ka NEZ ue mab) prevents migraines. It works by blocking a substance in the body that causes migraines. It may also be used to treat cluster headaches. It is a monoclonal antibody. This medicine may be used for other purposes; ask your health care provider or pharmacist if you have questions. COMMON BRAND NAME(S): Emgality What should I tell my care team before I take this medication? They need to know if you have any of these conditions: An unusual or allergic reaction to galcanezumab, other medications, foods, dyes, or preservatives Pregnant or trying to get pregnant Breast-feeding How should I use this medication? This medication is injected under the skin. You will be taught how to prepare and give it. Take it as directed on the prescription label. Keep taking it unless your care team tells you to stop. It is important that you put your used needles and syringes in a special sharps container. Do not put them in a trash can. If you do not have a sharps container, call your pharmacist or care team to get one. Talk to your care team about the use of this medication  in children. Special care may be needed. Overdosage: If you think you have taken too much of this medicine contact a poison control center or emergency room at once. NOTE: This medicine is only for you. Do not share this medicine with others. What if I miss a dose? If you miss a dose, take it as soon as you can. If it is almost time for your next dose, take only that dose. Do not take double or extra doses. What may interact with this medication? Interactions are not expected. This list may not describe all possible interactions. Give your health care provider a list of all the medicines, herbs, non-prescription drugs, or dietary supplements you  use. Also tell them if you smoke, drink alcohol, or use illegal drugs. Some items may interact with your medicine. What should I watch for while using this medication? Visit your care team for regular checks on your progress. Tell your care team if your symptoms do not start to get better or if they get worse. What side effects may I notice from receiving this medication? Side effects that you should report to your care team as soon as possible: Allergic reactions or angioedema--skin rash, itching or hives, swelling of the face, eyes, lips, tongue, arms, or legs, trouble swallowing or breathing Side effects that usually do not require medical attention (report to your care team if they continue or are bothersome): Pain, redness, or irritation at injection site This list may not describe all possible side effects. Call your doctor for medical advice about side effects. You may report side effects to FDA at 1-800-FDA-1088. Where should I keep my medication? Keep out of the reach of children and pets. Store in a refrigerator or at room temperature between 20 and 25 degrees C (68 and 77 degrees F). Refrigeration (preferred): Store in the refrigerator. Do not freeze. Keep in the original container until you are ready to take it. Remove the dose from the carton  about 30 minutes before it is time for you to use it. If the dose is not used, it may be stored in original container at room temperature for 7 days. Get rid of any unused medication after the expiration date. Room Temperature: This medication may be stored at room temperature for up to 7 days. Keep it in the original container. Protect from light until time of use. If it is stored at room temperature, get rid of any unused medication after 7 days or after it expires, whichever is first. To get rid of medications that are no longer needed or have expired: Take the medication to a medication take-back program. Check with your pharmacy or law enforcement to find a location. If you cannot return the medication, ask your pharmacist or care team how to get rid of this medication safely. NOTE: This sheet is a summary. It may not cover all possible information. If you have questions about this medicine, talk to your doctor, pharmacist, or health care provider.  2023 Elsevier/Gold Standard (2021-07-18 00:00:00)   Rolanda Lundborg Injection What is this medication? FREMANEZUMAB (fre ma NEZ ue mab) prevents migraines. It works by blocking a substance in the body that causes migraines. It is a monoclonal antibody. This medicine may be used for other purposes; ask your health care provider or pharmacist if you have questions. COMMON BRAND NAME(S): AJOVY What should I tell my care team before I take this medication? They need to know if you have any of these conditions: An unusual or allergic reaction to fremanezumab, other medications, foods, dyes, or preservatives Pregnant or trying to get pregnant Breast-feeding How should I use this medication? This medication is injected under the skin. You will be taught how to prepare and give it. Take it as directed on the prescription label. Keep taking it unless your care team tells you to stop. It is important that you put your used needles and syringes in a special  sharps container. Do not put them in a trash can. If you do not have a sharps container, call your pharmacist or care team to get one. Talk to your care team about the use of this medication in children. Special care may be needed. Overdosage:  If you think you have taken too much of this medicine contact a poison control center or emergency room at once. NOTE: This medicine is only for you. Do not share this medicine with others. What if I miss a dose? If you miss a dose, take it as soon as you can. If it is almost time for your next dose, take only that dose. Do not take double or extra doses. What may interact with this medication? Interactions are not expected. This list may not describe all possible interactions. Give your health care provider a list of all the medicines, herbs, non-prescription drugs, or dietary supplements you use. Also tell them if you smoke, drink alcohol, or use illegal drugs. Some items may interact with your medicine. What should I watch for while using this medication? Tell your care team if your symptoms do not start to get better or if they get worse. What side effects may I notice from receiving this medication? Side effects that you should report to your care team as soon as possible: Allergic reactions or angioedema--skin rash, itching or hives, swelling of the face, eyes, lips, tongue, arms, or legs, trouble swallowing or breathing Side effects that usually do not require medical attention (report to your care team if they continue or are bothersome): Pain, redness, or irritation at injection site This list may not describe all possible side effects. Call your doctor for medical advice about side effects. You may report side effects to FDA at 1-800-FDA-1088. Where should I keep my medication? Keep out of the reach of children and pets. Store in a refrigerator or at room temperature between 20 and 25 degrees C (68 and 77 degrees F). Refrigeration (preferred):  Store in the refrigerator. Do not freeze. Keep in the original container until you are ready to take it. Remove the dose from the carton about 30 minutes before it is time for you to use it. If the dose is not used, it may be stored in the original container at room temperature for 7 days. Get rid of any unused medication after the expiration date. Room Temperature: This medication may be stored at room temperature for up to 7 days. Keep it in the original container. Protect from light until time of use. If it is stored at room temperature, get rid of any unused medication after 7 days or after it expires, whichever is first. To get rid of medications that are no longer needed or have expired: Take the medication to a medication take-back program. Check with your pharmacy or law enforcement to find a location. If you cannot return the medication, ask your pharmacist or care team how to get rid of this medication safely. NOTE: This sheet is a summary. It may not cover all possible information. If you have questions about this medicine, talk to your doctor, pharmacist, or health care provider.  2023 Elsevier/Gold Standard (2021-07-18 00:00:00)  Non pharmaceutical treatments for migraines  Cefaly    Nerivio     gammaCore SapphireTM is the first and only FDA cleared non-invasive device to treat and prevent multiple types of headache pain via the vagus nerve. It's small, handheld and portable for quick and easy treatments whenever and wherever needed.     E-Neura - [email or web address].[email or web address]     Fascial release tools

## 2022-08-07 ENCOUNTER — Encounter: Payer: Self-pay | Admitting: Neurology

## 2022-08-07 MED ORDER — AJOVY 225 MG/1.5ML ~~LOC~~ SOAJ: 225.0000 mg | SUBCUTANEOUS | 11 refills | Status: DC

## 2022-08-09 ENCOUNTER — Ambulatory Visit: Payer: BC Managed Care – PPO | Admitting: Psychiatry

## 2022-08-09 DIAGNOSIS — F411 Generalized anxiety disorder: Secondary | ICD-10-CM | POA: Diagnosis not present

## 2022-08-09 NOTE — Progress Notes (Signed)
      [facility] Counselor/Therapist Progress Note  Patient ID: Patricia Mclean, MRN: [id number],    Date: 08/09/2022  Time Spent: 46 minutes start time 12:05 PM end time 12:51 PM  Treatment Type: Individual Therapy  Reported Symptoms: anxiety, triggered responses, panic, sadness  Mental Status Exam:  Appearance:   Well Groomed     Behavior:  Appropriate  Motor:  Normal  Speech/Language:   Normal Rate  Affect:  Appropriate  Mood:  anxious  Thought process:  normal  Thought content:    WNL  Sensory/Perceptual disturbances:    WNL  Orientation:  oriented to person, place, time/date, and situation  Attention:  Good  Concentration:  Good  Memory:  WNL  Fund of knowledge:   Good  Insight:    Good  Judgment:   Good  Impulse Control:  Good   Risk Assessment: Danger to Self:  No Self-injurious Behavior: No Danger to Others: No Duty to Warn:no Physical Aggression / Violence:No  Access to Firearms a concern: No  Gang Involvement:No   Subjective: Patient was present for session. She shared that she had been on a trip to [place] and she was upset due to having lots of anxiety. She shared she wanted to figure out why it was so bad.She shared that it is hard with her dad and dogs.  Patient did process things that on husband's emergency surgery, said several 8, negative cognition "I am out of control" felt panic in her stomach and chest.  Patient was able to reduce those level to 2.  She was able to recognize that she did get through everything she had to with her husband and he is doing well currently.  She was also able to see the support that she had and that was very helpful for her.  Patient was encouraged to remind herself of those facts on a regular basis.  Interventions: Eye Movement Desensitization and Reprocessing (EMDR) and Insight-Oriented  Diagnosis:   ICD-10-CM   1. Generalized anxiety disorder  F41.1       Plan: Patient is to practice coping skills to decrease  anxiety and panic.  Patient is to talk with husband about what she would need to do if anything were to happen to him.  Patient is to continue allowing processing to continue over the next few weeks.  Patient is to work on listening to theta music prior to bedtime to see if it improves sleep issues.  Patient is to work on exercising to release negative emotions appropriately. Long-term goal: Resolve core conflict that is the source of anxiety-decrease nightmares and flashbacks by 75% Short-term goal: Increase being able to engage in pleasurable activities by 50%-travel, walk with dogs  Patricia Mclean, [facility]

## 2022-08-23 ENCOUNTER — Ambulatory Visit: Payer: BC Managed Care – PPO | Admitting: Psychiatry

## 2022-08-23 ENCOUNTER — Telehealth: Payer: Self-pay | Admitting: Psychiatry

## 2022-08-23 DIAGNOSIS — F411 Generalized anxiety disorder: Secondary | ICD-10-CM

## 2022-08-23 DIAGNOSIS — G4700 Insomnia, unspecified: Secondary | ICD-10-CM

## 2022-08-23 MED ORDER — TRAZODONE HCL 100 MG PO TABS: ORAL_TABLET | ORAL | 1 refills | Status: DC

## 2022-08-23 NOTE — Telephone Encounter (Signed)
Pharmacy requesting order clarification on Trazodone. Order clarification sent.

## 2022-08-23 NOTE — Progress Notes (Signed)
      [facility] Counselor/Therapist Progress Note  Patient ID: Patricia Mclean, MRN: [id number],    Date: 08/23/2022  Time Spent: 50 minutes start time 1:10 PM end time 2 PM  Treatment Type: Individual Therapy  Reported Symptoms: anxiety, rumination  Mental Status Exam:  Appearance:   Casual and Neat     Behavior:  Appropriate  Motor:  Normal  Speech/Language:   Normal Rate  Affect:  Appropriate  Mood:  anxious  Thought process:  normal  Thought content:    WNL  Sensory/Perceptual disturbances:    WNL  Orientation:  oriented to person, place, time/date, and situation  Attention:  Good  Concentration:  Good  Memory:  WNL  Fund of knowledge:   Good  Insight:    Good  Judgment:   Good  Impulse Control:  Good   Risk Assessment: Danger to Self:  No Self-injurious Behavior: No Danger to Others: No Duty to Warn:no Physical Aggression / Violence:No  Access to Firearms a concern: No  Gang Involvement:No   Subjective: Patient was present for session. She shared that she has had a decrease in her nightmares and her anxiety. She went on to share that she is feeling anxiety about getting ready to see her father and knows that he has had a decline in his dementia. She also has recently found out that one of her best friends is getting ready to move to [place]. She shared that is not happening until June. She was encouraged to work through seeing her father frustrated, SUDS level 10  negative cognition "I'm a failure" felt anxiety and sadness in her chest. She was able to resolve the set and realize no one can fix dad's dementia so she is not a failure.  Interventions: Eye Movement Desensitization and Reprocessing (EMDR) and Insight-Oriented  Diagnosis:   ICD-10-CM   1. Generalized anxiety disorder  F41.1       Plan:  Patient is to practice coping skills to decrease anxiety and panic.  Patient is to talk with husband about what she would need to do if anything were to happen  to him.  Patient is to continue allowing processing to continue over the next few weeks.  Patient is to work on listening to theta music prior to bedtime to see if it improves sleep issues.  Patient is to work on exercising to release negative emotions appropriately. Long-term goal: Resolve core conflict that is the source of anxiety-decrease nightmares and flashbacks by 75% Short-term goal: Increase being able to engage in pleasurable activities by 50%-travel, walk with dogs  Lina Sayre, [facility]

## 2022-08-27 ENCOUNTER — Encounter: Payer: Self-pay | Admitting: Neurology

## 2022-08-27 DIAGNOSIS — G43709 Chronic migraine without aura, not intractable, without status migrainosus: Secondary | ICD-10-CM

## 2022-08-29 MED ORDER — NURTEC 75 MG PO TBDP: 75.0000 mg | ORAL_TABLET | Freq: Every day | ORAL | 3 refills | Status: DC | PRN

## 2022-08-29 NOTE — Addendum Note (Signed)
Addended by: Gildardo Griffes on: 08/29/2022 05:07 PM   Modules accepted: Orders

## 2022-08-30 ENCOUNTER — Telehealth: Payer: Self-pay | Admitting: *Deleted

## 2022-08-30 NOTE — Telephone Encounter (Signed)
Received PA for Nurtec. CMM KEY: [place] from patient: 12 headache days a month with about half of those migraine days.   From a thorough review of records, medications tried that can be used in migraine and headache management include: Metoprolol, topiramate, amitriptyline, cymbalta, zofran, relpax, imitrex, zofran, qulipta, aimovig contraindicated due to constipation, almotriptan, Eletriptan.

## 2022-09-05 MED ORDER — UBRELVY 100 MG PO TABS: 100.0000 mg | ORAL_TABLET | ORAL | 5 refills | Status: DC | PRN

## 2022-09-05 NOTE — Telephone Encounter (Signed)
Patient Advocate Encounter   Received notification that prior authorization for Nurtec 75MG  dispersible tablets is required.   PA submitted on 09/05/2022 [place] Electronic PA Form Status is pending       Lyndel Safe, [place] Patient Advocate Specialist [place] Patient Advocate Team Direct Number: [phone]  Fax: [phone]

## 2022-09-05 NOTE — Telephone Encounter (Signed)
Received v.o. from Dr Jaynee Eagles to cancel Nurtec and order Ubrelvy 100 mg.    Orders placed.

## 2022-09-05 NOTE — Telephone Encounter (Signed)
   Patricia Mclean trial required first.

## 2022-09-05 NOTE — Addendum Note (Signed)
Addended by: Gildardo Griffes on: 09/05/2022 04:40 PM   Modules accepted: Orders

## 2022-09-06 ENCOUNTER — Ambulatory Visit: Payer: BC Managed Care – PPO | Admitting: Psychiatry

## 2022-09-06 NOTE — Telephone Encounter (Signed)
Pharmacy Patient Advocate Encounter  Received notification from [place] that the request for prior authorization for Nurtec 75MG  dispersible tablets has been denied due to see below.         Please be advised we currently do not have a Pharmacist to review denials, therefore you will need to process appeals accordingly as needed. Thanks for your support at this time.

## 2022-09-12 ENCOUNTER — Telehealth: Payer: Self-pay | Admitting: *Deleted

## 2022-09-12 ENCOUNTER — Ambulatory Visit: Payer: BC Managed Care – PPO | Admitting: Psychiatry

## 2022-09-12 ENCOUNTER — Encounter: Payer: Self-pay | Admitting: Psychiatry

## 2022-09-12 DIAGNOSIS — F411 Generalized anxiety disorder: Secondary | ICD-10-CM | POA: Diagnosis not present

## 2022-09-12 DIAGNOSIS — F3342 Major depressive disorder, recurrent, in full remission: Secondary | ICD-10-CM

## 2022-09-12 DIAGNOSIS — G4700 Insomnia, unspecified: Secondary | ICD-10-CM

## 2022-09-12 MED ORDER — DULOXETINE HCL 60 MG PO CPEP: 60.0000 mg | ORAL_CAPSULE | Freq: Every day | ORAL | 1 refills | Status: DC

## 2022-09-12 MED ORDER — TRAZODONE HCL 100 MG PO TABS: ORAL_TABLET | ORAL | 1 refills | Status: DC

## 2022-09-12 MED ORDER — ARIPIPRAZOLE 2 MG PO TABS: 2.0000 mg | ORAL_TABLET | Freq: Every day | ORAL | 1 refills | Status: DC

## 2022-09-12 MED ORDER — BUSPIRONE HCL 30 MG PO TABS: 30.0000 mg | ORAL_TABLET | Freq: Two times a day (BID) | ORAL | 1 refills | Status: DC

## 2022-09-12 MED ORDER — SERTRALINE HCL 100 MG PO TABS: 100.0000 mg | ORAL_TABLET | Freq: Every day | ORAL | 1 refills | Status: DC

## 2022-09-12 NOTE — Telephone Encounter (Signed)
I see Roselyn Meier was ordered on 3/28. Roselyn Meier PA has been sent to the PA team to complete.

## 2022-09-12 NOTE — Progress Notes (Signed)
ARMEDA Mclean [id number] 06/29/1970 52 y.o.  Subjective:   Patient ID:  Patricia Mclean is a 52 y.o. (DOB 1971/02/19) female.  Chief Complaint:  Chief Complaint  Patient presents with   Follow-up    Anxiety, depression, and insomnia    HPI Patricia Mclean presents to the office today for follow-up of anxiety, depression, and sleep disturbance. She reports that she is feeling "really well" and has been seeing Lina Sayre, [facility] for therapy and feels this has been helpful.   She reports that her sleep has improved with Trazodone and she is now able to get a full nights's sleep, which has in turn helped her migraines. She reports fewer nightmares and intrusive thoughts. She reports that her anxiety is "a lot better. I fel like I am learning coping mechanisms." She reports that she is working through anxiety when it starts. Denies any recent panic attacks. Less intrusive thoughts. She reports that rumination and catastrophic thinking have improved.Denies depressed mood. Energy and motivation have been good. Concentration has been good and is able to be productive. Appetite has been good. She reports that she has intentionally lost about 10 lbs with exercise and reducing portions. Denies SI.   Visited father at [place]. Reports that her father continues to decline.   Her work has been going well. 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   One of her closest friend's accepted a new position in [place]. Sees friend at least weekly now.   Past Psychiatric Medication Trials: Paxil-May have caused weight gain. Limited improvement. Took 20 mg.  Celexa- Denies significant improvement in mood or anxiety. Has taken for about a year.  Zoloft Cymbalta Topamax-Prescribed for migraines. No improvement on anxiety.  Alprazolam-Helpful for sleep initiation Gabapentin- Prescribed for post-operative nerve pain. Took briefly. Did not seem to improve anxiety.  Buspar-  Ineffective Abilify-No improvement at 5 mg. Wt gain/unable to lose weight [place] Office Visit from 09/12/2022 in [place] Office Visit from 06/13/2022 in [facility] Office Visit from 04/23/2022 in [facility] Office Visit from 03/12/2022 in [place] Office Visit from 11/02/2021 in [facility]  AIMS Total Score 0 0 0 0 0      GAD-7    Flowsheet Row Counselor from 11/23/2020 in [place]  Total GAD-7 Score 14      PHQ2-9    Flowsheet Row Counselor from 11/23/2020 in [place]  PHQ-2 Total Score 3  PHQ-9 Total Score 11      [place] [facility] from 04/20/2021 in [facility] [place] No Risk        Review of Systems:  Review of Systems  Musculoskeletal:  Negative for gait problem.  Neurological:  Negative for tremors.  Psychiatric/Behavioral:         Please refer to HPI    Medications: I have reviewed the patient's current medications.  Current Outpatient Medications  Medication Sig Dispense Refill   chlorthalidone (HYGROTON) 25 MG tablet Take 12.5 mg by mouth every morning.     famotidine (PEPCID) 40 MG tablet Take by mouth.     fexofenadine (ALLEGRA) 180 MG tablet Take 180 mg by mouth as needed.     Fremanezumab-vfrm (AJOVY) 225 MG/1.5ML SOAJ Inject 225 mg into the skin every 30 (thirty) days.  Pls run copay card: BIN [id number] PCN CN Group [id number] ID [id number] expires 06/11/2023 1.5 mL 11   ibuprofen (ADVIL) 800 MG tablet Take by mouth as needed for cramping.     Methylcellulose, Laxative, (CITRUCEL) 500 MG TABS Take by mouth.     metoprolol tartrate (LOPRESSOR) 50 MG tablet Take 50 mg by mouth 2 (two) times daily.     Multiple Vitamins-Minerals (ONE-A-DAY WOMENS PO) Take by mouth.      omeprazole (PRILOSEC) 40 MG capsule Take 40 mg by mouth daily.     ondansetron (ZOFRAN ODT) 4 MG disintegrating tablet Take 1 tablet (4 mg total) by mouth every 8 (eight) hours as needed for nausea or vomiting. 20 tablet 0   Potassium Chloride ER 20 MEQ TBCR Take 2 tablets by mouth 2 (two) times daily.     rosuvastatin (CRESTOR) 20 MG tablet Take 20 mg by mouth daily.     Topiramate ER (TROKENDI XR) 100 MG CP24 Take 100 mg by mouth 2 (two) times daily.     Ubrogepant (UBRELVY) 100 MG TABS Take 1 tablet (100 mg total) by mouth every 2 (two) hours as needed. Maximum 200mg  a day. 16 tablet 5   vitamin B-12 (CYANOCOBALAMIN) 1000 MCG tablet Take 1,000 mcg by mouth daily.     ARIPiprazole (ABILIFY) 2 MG tablet Take 1 tablet (2 mg total) by mouth daily. 90 tablet 1   busPIRone (BUSPAR) 30 MG tablet Take 1 tablet (30 mg total) by mouth 2 (two) times daily. 180 tablet 1   cyclobenzaprine (FLEXERIL) 10 MG tablet Take 1 tablet (10 mg total) by mouth at bedtime. (Patient not taking: Reported on 09/12/2022) 90 tablet 3   DULoxetine (CYMBALTA) 60 MG capsule Take 1 capsule (60 mg total) by mouth daily. 90 capsule 1   methylPREDNISolone (MEDROL DOSEPAK) 4 MG TBPK tablet Take pills daily all together with food. Take the first dose (6 pills) as soon as possible. Take the rest each morning. For 6 days total 6-5-4-3-2-1. 21 tablet 1   sertraline (ZOLOFT) 100 MG tablet Take 1 tablet (100 mg total) by mouth daily. 90 tablet 1   traZODone (DESYREL) 100 MG tablet Take 1/2-1 tablet po QHS prn insomnia 90 tablet 1   No current facility-administered medications for this visit.    Medication Side Effects: None  Allergies:  Allergies  Allergen Reactions   Penicillins Anaphylaxis and Shortness Of Breath    Did it involve swelling of the face/tongue/throat, SOB, or low BP? Yes Did it involve sudden or severe rash/hives, skin peeling, or any reaction on the inside of your mouth or nose? No Did you need to seek medical  attention at a hospital or doctor's office? Yes When did it last happen?      30+ years If all above answers are "NO", may proceed with cephalosporin use.    Avelox [Moxifloxacin Hcl] Hives   Levofloxacin Hives    Past Medical History:  Diagnosis Date   Anxiety    GERD (gastroesophageal reflux disease)    Headache    metoprolol and topamx   Hypertension    Pneumonia    x3 had vaccine    Past Medical History, Surgical history, Social history, and Family history were reviewed and updated as appropriate.   Please see review of systems for further details on the patient's review from today.   Objective:   Physical Exam:  There were no vitals taken for this visit.  Physical Exam Constitutional:  General: She is not in acute distress. Musculoskeletal:        General: No deformity.  Neurological:     Mental Status: She is alert and oriented to person, place, and time.     Coordination: Coordination normal.  Psychiatric:        Attention and Perception: Attention and perception normal. She does not perceive auditory or visual hallucinations.        Mood and Affect: Mood normal. Mood is not anxious or depressed. Affect is not labile, blunt, angry or inappropriate.        Speech: Speech normal.        Behavior: Behavior normal.        Thought Content: Thought content normal. Thought content is not paranoid or delusional. Thought content does not include homicidal or suicidal ideation. Thought content does not include homicidal or suicidal plan.        Cognition and Memory: Cognition and memory normal.        Judgment: Judgment normal.     Comments: Insight intact     Lab Review:     Component Value Date/Time   NA 136 04/20/2021 0848   K 2.8 (L) 04/20/2021 0848   CL 103 04/20/2021 0848   CO2 23 04/20/2021 0848   GLUCOSE 125 (H) 04/20/2021 0848   BUN 10 04/20/2021 0848   CREATININE 0.83 04/20/2021 0848   CALCIUM 9.1 04/20/2021 0848   PROT 7.5 08/21/2018 1503    ALBUMIN 4.4 08/21/2018 1503   AST 24 08/21/2018 1503   ALT 26 08/21/2018 1503   ALKPHOS 124 08/21/2018 1503   BILITOT 0.6 08/21/2018 1503   GFRNONAA >60 04/20/2021 0848   GFRAA >60 08/21/2018 1503       Component Value Date/Time   WBC 13.5 (H) 04/20/2021 0848   RBC 3.93 04/20/2021 0848   HGB 12.8 04/20/2021 0848   HCT 38.2 04/20/2021 0848   PLT 224 04/20/2021 0848   MCV 97.2 04/20/2021 0848   MCH 32.6 04/20/2021 0848   MCHC 33.5 04/20/2021 0848   RDW 13.2 04/20/2021 0848   LYMPHSABS 1.1 04/20/2021 0848   MONOABS 0.7 04/20/2021 0848   EOSABS 0.1 04/20/2021 0848   BASOSABS 0.1 04/20/2021 0848    No results found for: "POCLITH", "LITHIUM"   No results found for: "PHENYTOIN", "PHENOBARB", "VALPROATE", "CBMZ"   .res Assessment: Plan:   Pt seen for 30 minutes and time spent discussing response to treatment and her questions about taking medication long-term. Will continue current plan of care since target signs and symptoms are well controlled without any tolerability issues. Continue Abilify 2 mg daily for depression.  Will continue Duloxetine 60 mg po qd for depression and anxiety.  Continue Sertraline 100 mg daily for anxiety and depression.  Continue Alprazolam 0.25 mg 2-3 tabs po QHS for insomnia.  Continue Trazodone 100 mg po QHS prn insomnia.  Continue Buspar 30 mg po BID for anxiety.  Recommend continuing therapy with Lina Sayre, [facility].  Pt to follow-up in 6 months or sooner if clinically indicated.  Patient advised to contact office with any questions, adverse effects, or acute worsening in signs and symptoms.   Denys was seen today for follow-up.  Diagnoses and all orders for this visit:  Recurrent major depressive disorder, in full remission -     ARIPiprazole (ABILIFY) 2 MG tablet; Take 1 tablet (2 mg total) by mouth daily.  Generalized anxiety disorder -     busPIRone (BUSPAR) 30 MG tablet; Take 1 tablet (  30 mg total) by mouth 2 (two) times daily. -      DULoxetine (CYMBALTA) 60 MG capsule; Take 1 capsule (60 mg total) by mouth daily. -     sertraline (ZOLOFT) 100 MG tablet; Take 1 tablet (100 mg total) by mouth daily.  Insomnia, unspecified type -     traZODone (DESYREL) 100 MG tablet; Take 1/2-1 tablet po QHS prn insomnia     Please see After Visit Summary for patient specific instructions.  Future Appointments  Date Time Provider [place]  09/17/2022 10:15 AM Mcarthur Rossetti, MD [facility] None  09/20/2022  1:00 PM Lina Sayre, [facility] None  10/04/2022  2:00 PM Lina Sayre, [facility] CP-CP None  11/01/2022  1:00 PM Lina Sayre, [facility] CP-CP None  12/03/2022  2:00 PM Melvenia Beam, MD [facility] None  03/14/2023  2:00 PM Thayer Headings, PMHNP CP-CP None    No orders of the defined types were placed in this encounter.   -------------------------------

## 2022-09-12 NOTE — Telephone Encounter (Signed)
Roselyn Meier PA needed  now.

## 2022-09-13 ENCOUNTER — Other Ambulatory Visit (HOSPITAL_COMMUNITY): Payer: Self-pay

## 2022-09-13 NOTE — Telephone Encounter (Signed)
Pharmacy Patient Advocate Encounter   Received notification from [place] that prior authorization for Ubrelvy 100MG  tablets is required/requested.   PA submitted on 09/13/2022 to (ins) [employer] via [employer] or [facility]) confirmation # [id number] Status is pending

## 2022-09-14 ENCOUNTER — Other Ambulatory Visit (HOSPITAL_COMMUNITY): Payer: Self-pay

## 2022-09-14 NOTE — Telephone Encounter (Signed)
Pharmacy Patient Advocate Encounter  Prior Authorization for Ubrelvy 100MG  Tablets has been approved by [employer] (ins).    PA # PA Case ID #: [id number] Effective dates: 09/13/2022 through 09/13/2023

## 2022-09-17 ENCOUNTER — Other Ambulatory Visit (INDEPENDENT_AMBULATORY_CARE_PROVIDER_SITE_OTHER): Payer: BC Managed Care – PPO

## 2022-09-17 ENCOUNTER — Encounter: Payer: Self-pay | Admitting: Orthopaedic Surgery

## 2022-09-17 ENCOUNTER — Ambulatory Visit: Payer: BC Managed Care – PPO | Admitting: Orthopaedic Surgery

## 2022-09-17 DIAGNOSIS — M87051 Idiopathic aseptic necrosis of right femur: Secondary | ICD-10-CM

## 2022-09-17 DIAGNOSIS — Z96642 Presence of left artificial hip joint: Secondary | ICD-10-CM | POA: Diagnosis not present

## 2022-09-17 NOTE — Progress Notes (Signed)
The patient is very well-known to me.  She has a left hip replacement done several years ago by one of my colleagues in town.  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  She was having right hip and groin pain and in December of last year we did obtain an MRI of the pelvis and right hip and it showed avascular necrosis.  Currently she is asymptomatic and says she is doing fine with the right hip with no pain in the groin and no pain with weightbearing.  Again her left hip has been hurting her.  Both hips move smoothly and fluidly.  There is no pain in the groin on the right side with compression or range of motion.  An AP pelvis shows both hips.  The left hip replacements there is no complicating features.  The right hip shows still no gross evidence on plain films of femoral head collapse or AVN.  Will continue to follow her conservatively unless she starts developing symptoms of the right hip.  She will let us know.  Hopefully her left hip will calm down as she is her pain lessens with her right hip however, she knows that if this worsens we would recommend a right total hip arthroplasty.

## 2022-09-20 ENCOUNTER — Ambulatory Visit: Payer: BC Managed Care – PPO | Admitting: Psychiatry

## 2022-09-20 DIAGNOSIS — F411 Generalized anxiety disorder: Secondary | ICD-10-CM

## 2022-09-20 NOTE — Progress Notes (Signed)
      [facility] Counselor/Therapist Progress Note  Patient ID: Patricia Mclean, MRN: [id number],    Date: 09/20/2022  Time Spent: 45 minutes start time 1:03 PM end time 1:48 PM  Treatment Type: Individual Therapy  Reported Symptoms: triggered responses, anxiety, sadness  Mental Status Exam:  Appearance:   Well Groomed     Behavior:  Appropriate  Motor:  Normal  Speech/Language:   Normal Rate  Affect:  Appropriate  Mood:  anxious  Thought process:  normal  Thought content:    WNL  Sensory/Perceptual disturbances:    WNL  Orientation:  oriented to person, place, time/date, and situation  Attention:  Good  Concentration:  Good  Memory:  WNL  Fund of knowledge:   Good  Insight:    Good  Judgment:   Good  Impulse Control:  Good   Risk Assessment: Danger to Self:  No Self-injurious Behavior: No Danger to Others: No Duty to Warn:no Physical Aggression / Violence:No  Access to Firearms a concern: No  Gang Involvement:No   Subjective: Patient was present for session. She shared that she went to see her dad and it was good and hard. She enjoyed the time they had together but he is declining which was hard for her.  Patient stated the thing that is creating the most stress for her currently is realizing her friend will be moving to [place] soon.  She shared that she typically has lunch with his friend every week and she is a huge source of support for her.  Patient stated that she has limited amounts of support in the area and is not sure what to do to try and move things in a different direction.  Brainstormed different ways that she could start connecting with people including for going for walks with neighbors, volunteering, and getting into a Bible study at her church.  Patient was also encouraged to start talking to her friend about ways that they can stay connected and meet as regularly as possible even with her in [place].  Patient agreed to work on plans from  session.  Interventions: Solution-Oriented/Positive Psychology  Diagnosis:   ICD-10-CM   1. Generalized anxiety disorder  F41.1       Plan: Patient is to practice coping skills to decrease anxiety and panic.  Patient is to talk with friend about ways they can stay connected while she is in [place].  Patient is to work on developing new relationships through walking with neighbors in the neighborhood volunteering, or going to a Bible study at church.  Patient is to continue allowing processing to continue over the next few weeks.  Patient is to work on listening to theta music prior to bedtime to see if it improves sleep issues.  Patient is to work on exercising to release negative emotions appropriately. Long-term goal: Resolve core conflict that is the source of anxiety-decrease nightmares and flashbacks by 75% Short-term goal: Increase being able to engage in pleasurable activities by 50%-travel, walk with dogs  Stevphen Meuse, [facility]

## 2022-10-02 ENCOUNTER — Telehealth: Payer: Self-pay | Admitting: Neurology

## 2022-10-02 NOTE — Telephone Encounter (Signed)
6/24 appt moved to 7/2 due to MD template change.

## 2022-10-04 ENCOUNTER — Ambulatory Visit: Payer: BC Managed Care – PPO | Admitting: Psychiatry

## 2022-10-04 DIAGNOSIS — F411 Generalized anxiety disorder: Secondary | ICD-10-CM | POA: Diagnosis not present

## 2022-10-04 NOTE — Progress Notes (Signed)
      [facility] Counselor/Therapist Progress Note  Patient ID: Patricia Mclean, MRN: [id number],    Date: 10/04/2022  Time Spent: 50 minutes start time 2:10 PM end time 3 PM  Treatment Type: Individual Therapy  Reported Symptoms: triggered responses, anxiety, obsessive thinking, rumination, sadness  Mental Status Exam:  Appearance:   Well Groomed     Behavior:  Appropriate  Motor:  Normal  Speech/Language:   Normal Rate  Affect:  Appropriate  Mood:  normal  Thought process:  normal  Thought content:    WNL  Sensory/Perceptual disturbances:    WNL  Orientation:  oriented to person, place, time/date, and situation  Attention:  Good  Concentration:  Good  Memory:  WNL  Fund of knowledge:   Good  Insight:    Good  Judgment:   Good  Impulse Control:  Good   Risk Assessment: Danger to Self:  No Self-injurious Behavior: No Danger to Others: No Duty to Warn:no Physical Aggression / Violence:No  Access to Firearms a concern: No  Gang Involvement:No   Subjective: Patient was present for session. She shared that she has been offered a job at her old employer that has been very tempting for her.  Allowed patient time to think through what she wants to help her decide if it is a good option for her or not.  She was able to realize that she wants to move forward and not take the job.  She went on to share she wanted to discuss more skills on how to manage her anxiety.  Discussed CBT skills including STOPP and gave her a handouts to refer back to over the next week.  Interventions: Cognitive Behavioral Therapy and Solution-Oriented/Positive Psychology  Diagnosis:   ICD-10-CM   1. Generalized anxiety disorder  F41.1       Plan:  Patient is to practice coping skills to decrease anxiety and panic.  Patient is to practice CBT skills and follow handouts from session.  Patient is to work on developing new relationships through walking with neighbors in the neighborhood  volunteering, or going to a Bible study at church.  Patient is to continue allowing processing to continue over the next few weeks.  Patient is to work on listening to theta music prior to bedtime to see if it improves sleep issues.  Patient is to work on exercising to release negative emotions appropriately. Long-term goal: Resolve core conflict that is the source of anxiety-decrease nightmares and flashbacks by 75% Short-term goal: Increase being able to engage in pleasurable activities by 50%-travel, walk with dogs  Stevphen Meuse, [facility]

## 2022-11-01 ENCOUNTER — Ambulatory Visit: Payer: BC Managed Care – PPO | Admitting: Psychiatry

## 2022-12-03 ENCOUNTER — Telehealth: Payer: BC Managed Care – PPO | Admitting: Neurology

## 2022-12-11 ENCOUNTER — Telehealth (INDEPENDENT_AMBULATORY_CARE_PROVIDER_SITE_OTHER): Payer: BC Managed Care – PPO | Admitting: Neurology

## 2022-12-11 ENCOUNTER — Encounter: Payer: Self-pay | Admitting: Neurology

## 2022-12-11 ENCOUNTER — Telehealth: Payer: Self-pay | Admitting: Neurology

## 2022-12-11 DIAGNOSIS — G43709 Chronic migraine without aura, not intractable, without status migrainosus: Secondary | ICD-10-CM | POA: Diagnosis not present

## 2022-12-11 MED ORDER — ONDANSETRON 4 MG PO TBDP: ORAL_TABLET | ORAL | 11 refills | Status: DC

## 2022-12-11 MED ORDER — TOPIRAMATE ER 50 MG PO CAP24: 50.0000 mg | ORAL_CAPSULE | Freq: Every day | ORAL | 11 refills | Status: DC

## 2022-12-11 NOTE — Patient Instructions (Addendum)
Decrease Trokendi to 50mg  and if doing well in a month or two you can stop it. If your migraines return go back to previous dose or we can try 25mg , mychart me Ubrelvy: RIGHT AT ONSET OF MIGRAINE: Take 4mg  ondansetron with ubrelvy and then can repeat 4mg  with ubrelvy 2 hours later if the migraine does not go away. Otherwise can take just for nausea 4-8mg  every 6-8 hours. Continue Ajovy Office will call for appointment for med refill in January but if something changes mychart me.  Ondansetron Dissolving Tablets What is this medication? ONDANSETRON (on DAN se tron) prevents nausea and vomiting from chemotherapy, radiation, or surgery. It works by blocking substances in the body that may cause nausea or vomiting. It belongs to a group of medications called antiemetics. This medicine may be used for other purposes; ask your health care provider or pharmacist if you have questions. COMMON BRAND NAME(S): Zofran ODT What should I tell my care team before I take this medication? They need to know if you have any of these conditions: Heart disease Irregular heartbeat or rhythm Liver disease Low levels of magnesium or potassium in the blood An unusual or allergic reaction to ondansetron, other medications, foods, dyes, or preservatives Pregnant or trying to get pregnant Breastfeeding How should I use this medication? Take this medication by mouth. Take it as directed on the prescription label at the same time every day. You do not need water to take this medication. Leave the tablet in the sealed pack until you are ready to take it. With dry hands, open the pack and gently remove the tablet. Place the tablet in the mouth and allow it to dissolve. Then, swallow it. Talk to your care team about the use of this medication in children. Special care may be needed. Overdosage: If you think you have taken too much of this medicine contact a poison control center or emergency room at once. NOTE: This medicine  is only for you. Do not share this medicine with others. What if I miss a dose? If you miss a dose, take it as soon as you can. If it is almost time for your next dose, take only that dose. Do not take double or extra doses. What may interact with this medication? Do not take this medication with any of the following: Apomorphine Certain medications for fungal infections, such as fluconazole, ketoconazole, posaconazole Cisapride Dronedarone Levoketoconazole Pimozide Quinidine Thioridazine This medication may also interact with the following: Certain medications for depression, anxiety, or other mental health conditions Certain medications for migraines, such as sumatriptan Linezolid Methylene blue Opioids Other medications that cause heart rhythm changes, such as dofetilide or ziprasidone St. John's wort Stimulant medications for ADHD, weight loss, or staying awake Tryptophan This list may not describe all possible interactions. Give your health care provider a list of all the medicines, herbs, non-prescription drugs, or dietary supplements you use. Also tell them if you smoke, drink alcohol, or use illegal drugs. Some items may interact with your medicine. What should I watch for while using this medication? Check with your care team as soon as you can if you have any sign of an allergic reaction. What side effects may I notice from receiving this medication? Side effects that you should report to your care team as soon as possible: Allergic reactions--skin rash, itching, hives, swelling of the face, lips, tongue, or throat Bowel blockage--stomach cramping, unable to have a bowel movement or pass gas, loss of appetite, vomiting Chest pain (  angina)--pain, pressure, or tightness in the chest, neck, back, or arms Heart rhythm changes--fast or irregular heartbeat, dizziness, feeling faint or lightheaded, chest pain, trouble breathing Irritability, confusion, fast or irregular heartbeat,  muscle stiffness, twitching muscles, sweating, high fever, seizure, chills, vomiting, diarrhea, which may be signs of serotonin syndrome Side effects that usually do not require medical attention (report to your care team if they continue or are bothersome): Constipation Diarrhea General discomfort and fatigue Headache This list may not describe all possible side effects. Call your doctor for medical advice about side effects. You may report side effects to FDA at 1-800-FDA-1088. Where should I keep my medication? Keep out of the reach of children and pets. Store between 2 and 30 degrees C (36 and 86 degrees F). Throw away any unused medication after the expiration date. NOTE: This sheet is a summary. It may not cover all possible information. If you have questions about this medicine, talk to your doctor, pharmacist, or health care provider.  2024 Elsevier/Gold Standard (2022-08-01 00:00:00)

## 2022-12-11 NOTE — Progress Notes (Signed)
[facility]    Provider:  Dr Lucia Gaskins Requesting Provider: Mosetta Putt, MD Primary Care Provider:  Mosetta Putt, MD  CC:  Migraines and daily headaches  Virtual Visit via Video Note  I connected with Patricia Mclean on 12/11/22 at 11:00 AM EDT by a video enabled telemedicine application and verified that I am speaking with the correct person using two identifiers.  Location: Patient: home Provider: office   I discussed the limitations of evaluation and management by telemedicine and the availability of in person appointments. The patient expressed understanding and agreed to proceed.  Follow Up Instructions:    I discussed the assessment and treatment plan with the patient. The patient was provided an opportunity to ask questions and all were answered. The patient agreed with the plan and demonstrated an understanding of the instructions.   The patient was advised to call back or seek an in-person evaluation if the symptoms worsen or if the condition fails to improve as anticipated.  I provided 30 minutes of non-face-to-face time during this encounter.   Anson Fret, MD   12/11/2022: Patricia Mclean is a 52 y.o. female here as requested by Mosetta Putt, MD for migraines and daily headaches.We saw her in February and started her on Ajovy: once a month injection. In the meantime Bridged her with Qulipta(Atogepant) for a month until we get Ajovy. Claudia Desanctis was approved instead of nurtec due to insurance. We also discussed: Could consider Transmagnetic Stimulation ([facility] ), Botox for migrianes - or in the masseters for her bruxism, discussed, Clenching - flexeril at bedtime, consider botox in the masseters  She is thrilled, down to 6 migraine days a month, milder as well, when she does get a migraine she takes [place] but she has not taken it immediately she has waited a few hours. Also don;t forget take immediately and then be sure to  repeat in 2 hours if still have any even little bit of migraine. She was having > 15 migraines a month that were moderate to severe. She is thrilled. Would recommend taking ubrelvy with tylenol or alleve. She has a lot of nausea. Will give ondansetron with the ubrelvy.  Methylprednisolone gave her blurry vision and eye pain.   Patient complains of symptoms per HPI as well as the following symptoms: none . Pertinent negatives and positives per HPI. All others negative   HPI 08/06/2022:  Patricia Mclean is a 52 y.o. female here as requested by Mosetta Putt, MD for migraines and daily headaches. PMHx chronic migraies, AVN left hip and femoral head, hypothyroidism, Mild intermittent asthma, anxiety, OCD, depression, insomnia, she is followed at [facility] by Corie Chiquito who manages her medications and Stevphen Meuse for psychotherapy.  Per Dr. Geoffery Lyons notes, she also has GERD, atopic dermatitis, irritable bowel syndrome with constipation diagnosed in 2018, chronic gastritis diagnosis 2018, hypertension since 2020, vitamin B12 deficiency with an associated peripheral neuropathy since 2021, hypercholesterolemia since 2021.  I reviewed Dr. Geoffery Lyons notes, she has a history of severe chronic headaches for many years and they have often been frequent and difficult to control.  She improved somewhat in 2017 on prophylaxis with metoprolol and topiramate and as needed Ella triptan but over the last 10 months her headaches have increased from 36-month up to 6 a month and are no longer responding to the triptan.  In the past she is required episodic use of prednisone for few days to break migraine cycle and can be fairly persistent  and severe.  No aura.  Usually bitemporal and bifrontal region.  She has not responded to sumatriptan or almotriptan.  In 2017 MRI was unremarkable.  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  I reviewed Dr. Geoffery Lyons general and neurologic exam which were extensive and thorough and normal except for tenderness over the left greater trochanter.  She supplements with B12.  She is here alsone ane reports Daily headaches. Migraines are moderate to severe >15 day a month. They last 24-72 hours. She has had to miss work and significantly affecting life. Flying is a trigger for a migraine. Started in HS having migraines, mother had terrible migraines and grandmother. Too much caffeine is a trigger. Alcohol is a trigger. She is a bad clencher, sunlight. Migraines are behind the eyes and eyeballs, pulsating/throbbing. Photo/phonophobia, nausea, no vomiting, no aura, no medication overuse, migraines are moderate to severe can make her leave work and disrupt family life. Moving makes it hurt, a dark quiet still room helps. Ongoing for > 1 year at this severity and frequencya nd she has tried multiple medications. No other focal neurologic deficits, associated symptoms, inciting events or modifiable factors.  Reviewed notes, labs and imaging from outside physicians, which showed:  CBC was unremarkable, CMP was also unremarkable with BUN 14 and creatinine 1.74 except for elevated ALT 76 and elevated AST 61, TSH 4.62, hemoglobin A1c 5.0, he is aware of her hypothyroidism and is managing that, labs were drawn April 01, 2022, vitamin D 36.2,  I reviewed MRI  images of the brain with and without contrast November 18, 2015 which was normal.  From a thorough review of records, medications tried that can be used in migraine and headache management include: Metoprolol, topiramate, amitriptyline, cymbalta, zofran, relpax, imitrex, zofran, qulipta, aimovig contraindicated due to constipation, almotriptan, Ella triptan.    Review of Systems: Patient  complains of symptoms per HPI as well as the following symptoms migraine, constipation and nausea, arthralgias, headaches, anxiety, sleep disturbance and OCD. Pertinent negatives and positives per HPI. All others negative.   Social History   Socioeconomic History   Marital status: Married    Spouse name: Not on file   Number of children: Not on file   Years of education: Not on file   Highest education level: Not on file  Occupational History   Not on file  Tobacco Use   Smoking status: Never   Smokeless tobacco: Never  Vaping Use   Vaping Use: Never used  Substance and Sexual Activity   Alcohol use: Yes    Alcohol/week: 1.0 standard drink of alcohol    Types: 1 Glasses of wine per week    Comment: occasional  wine with dinner   Drug use: Never   Sexual activity: Yes  Other Topics Concern   Not on file  Social History Narrative   Not on file   Social Determinants of Health   Financial Resource Strain: Low Risk  (08/22/2018)   Overall Financial Resource Strain (CARDIA)    Difficulty of Paying Living Expenses: Not very hard  Food Insecurity: No Food Insecurity (08/22/2018)   Hunger Vital Sign    Worried About Running Out of Food in the Last Year: Never true    Ran  Out of Food in the Last Year: Never true  Transportation Needs: No Transportation Needs (08/22/2018)   PRAPARE - Administrator, Civil Service (Medical): No    Lack of Transportation (Non-Medical): No  Physical Activity: Unknown (08/22/2018)   Exercise Vital Sign    Days of Exercise per Week: 2 days    Minutes of Exercise per Session: Not on file  Stress: No Stress Concern Present (08/22/2018)   [employer] of Occupational Health - Occupational Stress Questionnaire    Feeling of Stress : Only a little  Social Connections: Unknown (08/22/2018)   Social Connection and Isolation Panel [NHANES]    Frequency of Communication with Friends and Family: More than three times a week    Frequency of Social  Gatherings with Friends and Family: More than three times a week    Attends Religious Services: Not on file    Active Member of Clubs or Organizations: Yes    Attends Banker Meetings: Never    Marital Status: Married  Catering manager Violence: Not At Risk (08/22/2018)   Humiliation, Afraid, Rape, and Kick questionnaire    Fear of Current or Ex-Partner: No    Emotionally Abused: No    Physically Abused: No    Sexually Abused: No    Family History  Problem Relation Age of Onset   Breast cancer Mother    Ovarian cancer Mother    Stroke Mother    Migraines Mother    Dementia Father    Depression Brother    Anxiety disorder Brother    Migraines Paternal Grandmother     Past Medical History:  Diagnosis Date   Anxiety    GERD (gastroesophageal reflux disease)    Headache    metoprolol and topamx   Hypertension    Pneumonia    x3 had vaccine    Patient Active Problem List   Diagnosis Date Noted   Chronic migraine without aura without status migrainosus, not intractable 08/06/2022   Avascular necrosis of hip, left (HCC) 08/22/2018   Avascular necrosis of femoral head, left (HCC) 08/22/2018    Past Surgical History:  Procedure Laterality Date   TONSILLECTOMY     TOTAL HIP ARTHROPLASTY Left 08/22/2018   Procedure: TOTAL HIP ARTHROPLASTY ANTERIOR APPROACH;  Surgeon: Jodi Geralds, MD;  Location: [facility];  Service: Orthopedics;  Laterality: Left;    Current Outpatient Medications  Medication Sig Dispense Refill   Topiramate ER (TROKENDI XR) 50 MG CP24 Take 1 capsule (50 mg total) by mouth at bedtime. 30 capsule 11   ARIPiprazole (ABILIFY) 2 MG tablet Take 1 tablet (2 mg total) by mouth daily. 90 tablet 1   busPIRone (BUSPAR) 30 MG tablet Take 1 tablet (30 mg total) by mouth 2 (two) times daily. 180 tablet 1   chlorthalidone (HYGROTON) 25 MG tablet Take 12.5 mg by mouth every morning.     DULoxetine (CYMBALTA) 60 MG capsule Take 1 capsule (60 mg total) by mouth  daily. 90 capsule 1   famotidine (PEPCID) 40 MG tablet Take by mouth.     fexofenadine (ALLEGRA) 180 MG tablet Take 180 mg by mouth as needed.     Fremanezumab-vfrm (AJOVY) 225 MG/1.5ML SOAJ Inject 225 mg into the skin every 30 (thirty) days. Pls run copay card: BIN [id number] PCN [facility] Group [id number] ID [id number] expires 06/11/2023 1.5 mL 11   ibuprofen (ADVIL) 800 MG tablet Take by mouth as needed for cramping.     Methylcellulose, Laxative, (CITRUCEL) 500 MG  TABS Take by mouth.     methylPREDNISolone (MEDROL DOSEPAK) 4 MG TBPK tablet Take pills daily all together with food. Take the first dose (6 pills) as soon as possible. Take the rest each morning. For 6 days total 6-5-4-3-2-1. 21 tablet 1   metoprolol tartrate (LOPRESSOR) 50 MG tablet Take 50 mg by mouth 2 (two) times daily.     Multiple Vitamins-Minerals (ONE-A-DAY WOMENS PO) Take by mouth.     omeprazole (PRILOSEC) 40 MG capsule Take 40 mg by mouth daily.     ondansetron (ZOFRAN ODT) 4 MG disintegrating tablet RIGHT AT ONSET OF MIGRAINE: Take 4mg  ondansetron with ubrelvy and then can repeat 4mg  with ubrelvy 2 hours later if the migraine does not go away. Otherwise can take just for nausea 4-8mg  every 6-8 hours. 20 tablet 11   Potassium Chloride ER 20 MEQ TBCR Take 2 tablets by mouth 2 (two) times daily.     rosuvastatin (CRESTOR) 20 MG tablet Take 20 mg by mouth daily.     sertraline (ZOLOFT) 100 MG tablet Take 1 tablet (100 mg total) by mouth daily. 90 tablet 1   traZODone (DESYREL) 100 MG tablet Take 1/2-1 tablet po QHS prn insomnia 90 tablet 1   Ubrogepant (UBRELVY) 100 MG TABS Take 1 tablet (100 mg total) by mouth every 2 (two) hours as needed. Maximum 200mg  a day. 16 tablet 5   vitamin B-12 (CYANOCOBALAMIN) 1000 MCG tablet Take 1,000 mcg by mouth daily.     No current facility-administered medications for this visit.    Allergies as of 12/11/2022 - Review Complete 12/11/2022  Allergen Reaction Noted   Penicillins Anaphylaxis and  Shortness Of Breath 06/29/2006   Avelox [moxifloxacin hcl] Hives 08/21/2018   Methylprednisolone  12/11/2022   Levofloxacin Hives 06/29/2006    Vitals: There were no vitals taken for this visit. Last Weight:  Wt Readings from Last 1 Encounters:  08/06/22 175 lb (79.4 kg)   Last Height:   Ht Readings from Last 1 Encounters:  08/06/22 5\' 4"  (1.626 m)     Physical exam: Exam: Gen: NAD, conversant      CV:  Denies palpitations or chest pain or SOB. VS: Breathing at a normal rate. Weight appears within normal limits. Not febrile. Eyes: Conjunctivae clear without exudates or hemorrhage  Neuro: Detailed Neurologic Exam  Speech:    Speech is normal; fluent and spontaneous with normal comprehension.  Cognition:    The patient is oriented to person, place, and time;     recent and remote memory intact;     language fluent;     normal attention, concentration,     fund of knowledge Cranial Nerves:    The pupils are equal, round, and reactive to light. Visual fields are full to finger confrontation. Extraocular movements are intact.  The face is symmetric with normal sensation. The palate elevates in the midline. Hearing intact. Voice is normal. Shoulder shrug is normal. The tongue has normal motion without fasciculations.   Coordination:    Normal finger to nose  Gait:    Normal native gait  Motor Observation:   no involuntary movements noted. Tone:    Appears normal  Posture:    Posture is normal. normal erect    Strength:    Strength is anti-gravity and symmetric in the upper and lower limbs.      Sensation: intact to LT           Assessment/Plan: Patricia Mclean is a 52 y.o. female  here as requested by Mosetta Putt, MD for migraines and daily headaches.We saw her in February 2024 and started her on Ajovy: once a month injection. In the meantime Bridged her with Qulipta(Atogepant) for a month until we got Ajovy. Claudia Desanctis was approved instead of nurtec due to  insurance. We also discussed:  Transmagnetic Stimulation ([facility] ), Botox for migrianes - or in the masseters for her bruxism, discussed, Clenching - flexeril at bedtime, consider botox in the masseters  She is thrilled, down to 6 mild migraine days a month, milder as well, when she does get a migraine she takes [place] and doesn;t feel it is helpful BUT she has not taken it immediately she has waited a few hours. We discussed to take immediately and then be sure to repeat in 2 hours if still have any even little bit of migraine. She was having > 15 migraines a month that were moderate to severe at baseline and daily headaches. She is thrilled with this improvement. Would recommend taking ubrelvy with tylenol or alleve and/or zofran RIGH at onset. She has a lot of nausea. Will give ondansetron with the ubrelvy.  Methylprednisolone gave her blurry vision and eye pain.   Decrease Trokendi to 50mg  and if doing well in a month or two you can stop it. If your migraines return go back to previous dose or we can try 25mg , mychart me Ubrelvy: RIGHT AT ONSET OF MIGRAINE: Take 4mg  ondansetron with Bernita Raisin and then can repeat 4mg  with ubrelvy 2 hours later if the migraine does not go away. Otherwise can take just for nausea 4-8mg  every 6-8 hours. Continue Ajovy Office will call for appointment for med refill in January but if something changes mychart me.   Meds ordered this encounter  Medications   ondansetron (ZOFRAN ODT) 4 MG disintegrating tablet    Sig: RIGHT AT ONSET OF MIGRAINE: Take 4mg  ondansetron with ubrelvy and then can repeat 4mg  with ubrelvy 2 hours later if the migraine does not go away. Otherwise can take just for nausea 4-8mg  every 6-8 hours.    Dispense:  20 tablet    Refill:  11   Topiramate ER (TROKENDI XR) 50 MG CP24    Sig: Take 1 capsule (50 mg total) by mouth at bedtime.    Dispense:  30 capsule    Refill:  11   Non pharmaceutical treatments for  migraines  Cefaly    Nerivio     gammaCore SapphireTM is the first and only FDA cleared non-invasive device to treat and prevent multiple types of headache pain via the vagus nerve. It's small, handheld and portable for quick and easy treatments whenever and wherever needed.     E-Neura - [email or web address]     Fascial release tools    Cc: Mosetta Putt, MD,  Mosetta Putt, MD  Naomie Dean, MD  [facility] [address] [place], [place] [address]  Phone [phone] Fax [phone]

## 2022-12-11 NOTE — Telephone Encounter (Signed)
Sent mychart msg informing pt of follow up made with Megan 

## 2022-12-11 NOTE — Telephone Encounter (Signed)
Please set her up in January for med check and refill follow up. Can go to [facility] thanks

## 2022-12-20 ENCOUNTER — Ambulatory Visit: Payer: BC Managed Care – PPO | Admitting: Psychiatry

## 2022-12-20 DIAGNOSIS — F411 Generalized anxiety disorder: Secondary | ICD-10-CM | POA: Diagnosis not present

## 2022-12-20 NOTE — Progress Notes (Signed)
      [facility] Counselor/Therapist Progress Note  Patient ID: Patricia Mclean, MRN: [id number],    Date: 12/20/2022  Time Spent: 50 minutes Start time 1:05 PM 1:55 Minutes  Treatment Type: Individual Therapy  Reported Symptoms: anxiety, triggered responses, panic  Mental Status Exam:  Appearance:   Well Groomed     Behavior:  Appropriate  Motor:  Normal  Speech/Language:   Normal Rate  Affect:  Appropriate  Mood:  anxious  Thought process:  normal  Thought content:    WNL  Sensory/Perceptual disturbances:    WNL  Orientation:  oriented to person, place, time/date, and situation  Attention:  Good  Concentration:  Good  Memory:  WNL  Fund of knowledge:   Good  Insight:    Good  Judgment:   Good  Impulse Control:  Good   Risk Assessment: Danger to Self:  No Self-injurious Behavior: No Danger to Others: No Duty to Warn:no Physical Aggression / Violence:No  Access to Firearms a concern: No  Gang Involvement:No   Subjective: Patient was present for session. Discussed wether or not to make note set to private.  She shared wanting her notes set to private. She shared that her mother in law decided to move dad and her into a nursing home.  She went on to share that stepmother is packing in front of her dad and it upsets him and he doesn't understand what is going on than panics and calls patient multiple times a day upset everyday.  Since she is power of attorney they can't do anything. Did processing mom being in the casket because that had be surfacing recently SUDS level 10, negative cognition "I'm alone", felt anxiety in her stomach. She was able reduce SUDS level 2. Discussed importance continuing to affirm the younger part of her.   Interventions: Eye Movement Desensitization and Reprocessing (EMDR) and Insight-Oriented  Diagnosis:   ICD-10-CM   1. Generalized anxiety disorder  F41.1       Plan:  Patient is to practice coping skills to decrease anxiety and panic.   Patient is to practice affirming the younger part of her.  Patient is to work on developing new relationships through walking with neighbors in the neighborhood volunteering, or going to a Bible study at church.  Patient is to continue allowing processing to continue over the next few weeks.  Patient is to work on listening to theta music prior to bedtime to see if it improves sleep issues.  Patient is to work on exercising to release negative emotions appropriately. Long-term goal: Resolve core conflict that is the source of anxiety-decrease nightmares and flashbacks by 75% Short-term goal: Increase being able to engage in pleasurable activities by 50%-travel, walk with dogs  Stevphen Meuse, [facility]

## 2023-01-24 ENCOUNTER — Ambulatory Visit: Payer: BC Managed Care – PPO | Admitting: Psychiatry

## 2023-01-24 DIAGNOSIS — F411 Generalized anxiety disorder: Secondary | ICD-10-CM | POA: Diagnosis not present

## 2023-01-24 NOTE — Progress Notes (Signed)
      [facility] Counselor/Therapist Progress Note  Patient ID: Patricia Mclean, MRN: [id number],    Date: 01/24/2023  Time Spent: 48 minutes start time 1:00 PM end time 1:48 PM  Treatment Type: Individual Therapy  Reported Symptoms: anxiety, sadness, triggered responses, focusing issues, panic  Mental Status Exam:  Appearance:   Casual     Behavior:  Appropriate  Motor:  Normal  Speech/Language:   Normal Rate  Affect:  Appropriate  Mood:  normal  Thought process:  normal  Thought content:    WNL  Sensory/Perceptual disturbances:    WNL  Orientation:  oriented to person, place, time/date, and situation  Attention:  Good  Concentration:  Good  Memory:  WNL  Fund of knowledge:   Good  Insight:    Good  Judgment:   Good  Impulse Control:  Good   Risk Assessment: Danger to Self:  No Self-injurious Behavior: No Danger to Others: No Duty to Warn:no Physical Aggression / Violence:No  Access to Firearms a concern: No  Gang Involvement:No   Subjective: Patient was present for session.  Developed treatment plan and set goals with patient.  She shared that they did get her dad moved and it has been hard. She shared that ended up get sick and exhausted from it all.  Guilt about dad being there and can't do anything about it.  Patient did processing on dad's phone calls, suds level 8, negative cognition "I am powerless", felt guilt in her shoulders and chest.  Patient was able to reduce suds level to 2.  And recognize the fact that her dad made his decision and what she can do is to continue calming him down and redirecting him.  Discussed different ways that she can do that.  Interventions: Eye Movement Desensitization and Reprocessing (EMDR) and Insight-Oriented  Diagnosis:   ICD-10-CM   1. Generalized anxiety disorder  F41.1       Plan: Patient is to practice coping skills to decrease anxiety and panic.  Patient is to working on helping her dad transition to this facility  by getting his focus on what he needs to do rather than his feelings in the moment.  Patient is to work on developing new relationships through walking with neighbors in the neighborhood volunteering, or going to a Bible study at church.  Patient is to continue allowing processing to continue over the next few weeks.  Patient is to work on listening to theta music prior to bedtime to see if it improves sleep issues.  Patient is to work on exercising to release negative emotions appropriately.   Stevphen Meuse, [facility]

## 2023-03-07 ENCOUNTER — Ambulatory Visit: Payer: BC Managed Care – PPO | Admitting: Psychiatry

## 2023-03-07 DIAGNOSIS — F411 Generalized anxiety disorder: Secondary | ICD-10-CM | POA: Diagnosis not present

## 2023-03-07 NOTE — Progress Notes (Signed)
      [facility] Counselor/Therapist Progress Note  Patient ID: IVERY Mclean, MRN: [id number],    Date: 03/07/2023  Time Spent: 52 minutes start time 12:08 PM end tine 1  Treatment Type: Individual Therapy  Reported Symptoms: anxiety, panic, triggered responses  Mental Status Exam:  Appearance:   Well Groomed     Behavior:  Appropriate  Motor:  Normal  Speech/Language:   Normal Rate  Affect:  Appropriate  Mood:  anxious  Thought process:  normal  Thought content:    WNL  Sensory/Perceptual disturbances:    WNL  Orientation:  oriented to person, place, time/date, and situation  Attention:  Good  Concentration:  Good  Memory:  WNL  Fund of knowledge:   Good  Insight:    Good  Judgment:   Good  Impulse Control:  Good   Risk Assessment: Danger to Self:  No Self-injurious Behavior: No Danger to Others: No Duty to Warn:no Physical Aggression / Violence:No  Access to Firearms a concern: No  Gang Involvement:No   Subjective: Patient was present for session. She shared she had a really bad panic attack that had triggered her. She went on to share that the panic was triggered by not being able to get in touch with her husband.  Had her explore the situation and think through what was going on when the panic started.  As she was talking she was able to see that she has gone through lots of traumas in her life and with her job.  She shared that she had to deal with school threats, coworker having a heart attack, students Over dosing and other traumas. Encouraged her to start reminding herself that she and her husband are safe.Processing can take place at future sessions.  Interventions: Solution-Oriented/Positive Psychology and Insight-Oriented  Diagnosis:   ICD-10-CM   1. Generalized anxiety disorder  F41.1       Plan: Patient is to practice coping skills to decrease anxiety and panic.  Patient is to process work traumas at future sessions.   Patient is to work on  developing new relationships through walking with neighbors in the neighborhood volunteering, or going to a Bible study at church.  Patient is to continue allowing processing to continue over the next few weeks.  Patient is to work on listening to theta music prior to bedtime to see if it improves sleep issues.  Patient is to work on exercising to release negative emotions appropriately.   Stevphen Meuse, [facility]

## 2023-03-11 ENCOUNTER — Ambulatory Visit: Payer: BC Managed Care – PPO | Admitting: Orthopaedic Surgery

## 2023-03-11 ENCOUNTER — Encounter: Payer: Self-pay | Admitting: Orthopaedic Surgery

## 2023-03-11 DIAGNOSIS — Z96642 Presence of left artificial hip joint: Secondary | ICD-10-CM

## 2023-03-11 DIAGNOSIS — M7062 Trochanteric bursitis, left hip: Secondary | ICD-10-CM | POA: Diagnosis not present

## 2023-03-11 MED ORDER — METHYLPREDNISOLONE ACETATE 40 MG/ML IJ SUSP
40.0000 mg | INTRAMUSCULAR | Status: AC | PRN
  Administered 2023-03-11: 40 mg via INTRA_ARTICULAR

## 2023-03-11 MED ORDER — LIDOCAINE HCL 1 % IJ SOLN
3.0000 mL | INTRAMUSCULAR | Status: AC | PRN
  Administered 2023-03-11: 3 mL

## 2023-03-11 NOTE — Progress Notes (Signed)
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  That hip replacement was performed by one of my colleagues in town.  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  We have seen her before with left hip pain that was consistent with trochanteric bursitis.  She comes in today requesting steroid injection over the left hip trochanteric area.  She had done a lot of walking and traveling earlier this year and is developed pain of the trochanteric area again.  This has been a chronic issue and it comes and goes.  She is still denies any issues with her left hip at all with the no nidus of AVN but continues to remain and symptomatic.  When I have her lay in a supine position her leg lengths are still equal.  Her left hip has pain over the trochanteric area and the IT band.  It was definitely worthwhile trying a steroid injection again over the trochanteric area since it had been a long period time since she has had an injection.  She tolerated it well.  Follow-up is as needed.    Procedure Note  Patient: Patricia Mclean             Date of Birth: [birth year]           MRN: [id number]             Visit Date: 03/11/2023  Procedures: Visit Diagnoses:  1. History of total left hip replacement   2. Trochanteric bursitis, left hip     Large Joint Inj: L greater trochanter on 03/11/2023 1:45 PM Indications: pain and diagnostic evaluation Details: 22 G 1.5 in needle, lateral approach  Arthrogram: No  Medications: 3 mL lidocaine 1 %; 40 mg methylPREDNISolone acetate 40 MG/ML Outcome: tolerated well, no immediate complications Procedure, treatment alternatives, risks and benefits explained, specific risks discussed. Consent was given by the patient. Immediately prior to procedure a time out was called to verify the correct patient, procedure, equipment,  support staff and site/side marked as required. Patient was prepped and draped in the usual sterile fashion.

## 2023-03-14 ENCOUNTER — Ambulatory Visit: Payer: BC Managed Care – PPO | Admitting: Psychiatry

## 2023-03-14 ENCOUNTER — Encounter: Payer: Self-pay | Admitting: Psychiatry

## 2023-03-14 DIAGNOSIS — G4700 Insomnia, unspecified: Secondary | ICD-10-CM

## 2023-03-14 DIAGNOSIS — F411 Generalized anxiety disorder: Secondary | ICD-10-CM

## 2023-03-14 DIAGNOSIS — F3342 Major depressive disorder, recurrent, in full remission: Secondary | ICD-10-CM | POA: Diagnosis not present

## 2023-03-14 MED ORDER — TRAZODONE HCL 100 MG PO TABS: ORAL_TABLET | ORAL | 1 refills | Status: DC

## 2023-03-14 MED ORDER — ARIPIPRAZOLE 2 MG PO TABS: 2.0000 mg | ORAL_TABLET | Freq: Every day | ORAL | 1 refills | Status: DC

## 2023-03-14 MED ORDER — ALPRAZOLAM 0.25 MG PO TABS: ORAL_TABLET | ORAL | 5 refills | Status: AC

## 2023-03-14 MED ORDER — SERTRALINE HCL 100 MG PO TABS: 100.0000 mg | ORAL_TABLET | Freq: Every day | ORAL | 1 refills | Status: DC

## 2023-03-14 MED ORDER — BUSPIRONE HCL 30 MG PO TABS: 30.0000 mg | ORAL_TABLET | Freq: Two times a day (BID) | ORAL | 1 refills | Status: DC

## 2023-03-14 MED ORDER — DULOXETINE HCL 60 MG PO CPEP: 60.0000 mg | ORAL_CAPSULE | Freq: Every day | ORAL | 1 refills | Status: DC

## 2023-03-14 NOTE — Progress Notes (Signed)
Patricia Mclean [id number] [birth year] 51 y.o.  Subjective:   Patient ID:  Patricia Mclean is a 52 y.o. (DOB Feb 23, 1971) female.  Chief Complaint:  Chief Complaint  Patient presents with   Follow-up    Anxiety, depression, insomnia    HPI Patricia Mclean presents to the office today for follow-up of anxiety, depression, and insomnia.   She is working on her goal to travel and is "excited" about upcoming travel. Preparing to travel to [place] soon and then to [place] to spend the holidays with her husband's family.   Had to put father in an ALF in [place] and reports that this was difficult.  She reports anxiety is "still there, but I'm learning to manage it a lot better." She had a panic attack recently when she was not able to get in touch with husband in an airport. She reports that this was the first panic attack she has had in awhile. She has occasional intrusive and catastrophic thoughts. Some continued worry.   Denies "any depression right now." Energy and motivation have been good. Concentration has been good. She reports adequate sleep with Trazodone. Appetite has been ok. Denies SI.   She continues to enjoy her work. Husband and her dogs are doing well.   Takes alprazolam prn for panic, when traveling, waking up during the night with nightmares, etc.   Past Psychiatric Medication Trials: Paxil-May have caused weight gain. Limited improvement. Took 20 mg.  Celexa- Denies significant improvement in mood or anxiety. Has taken for about a year.  Zoloft Cymbalta Topamax-Prescribed for migraines. No improvement on anxiety.  Alprazolam-Helpful for sleep initiation Gabapentin- Prescribed for post-operative nerve pain. Took briefly. Did not seem to improve anxiety.  Buspar- Ineffective Abilify-No improvement at 5 mg. Wt gain/unable to lose weight Rexulti    AIMS    Flowsheet Row Office Visit from 03/14/2023 in [facility] Office Visit from  09/12/2022 in [facility] Office Visit from 06/13/2022 in [facility] Office Visit from 04/23/2022 in [facility] Office Visit from 03/12/2022 in [facility]  AIMS Total Score 0 0 0 0 0      GAD-7    Flowsheet Row Counselor from 11/23/2020 in [facility]  Total GAD-7 Score 14      PHQ2-9    Flowsheet Row Counselor from 11/23/2020 in [facility]  PHQ-2 Total Score 3  PHQ-9 Total Score 11      Flowsheet Row ED from 04/20/2021 in [facility] Emergency Department at [facility]  C-SSRS RISK CATEGORY No Risk        Review of Systems:  Review of Systems  Musculoskeletal:  Negative for gait problem.       Improved hip pain after injection  Neurological:  Negative for tremors.       Improved migraines with Ajovy  Psychiatric/Behavioral:         Please refer to HPI    Medications: I have reviewed the patient's current medications.  Current Outpatient Medications  Medication Sig Dispense Refill   chlorthalidone (HYGROTON) 25 MG tablet Take 12.5 mg by mouth every morning.     famotidine (PEPCID) 40 MG tablet Take by mouth.     fexofenadine (ALLEGRA) 180 MG tablet Take 180 mg by mouth as needed.     Fremanezumab-vfrm (AJOVY) 225 MG/1.5ML SOAJ Inject 225 mg into the skin every 30 (thirty) days.  Pls run copay card: BIN [id number] PCN [facility] Group [id number] ID [id number] expires 06/11/2023 1.5 mL 11   Methylcellulose, Laxative, (CITRUCEL) 500 MG TABS Take by mouth.     Multiple Vitamins-Minerals (ONE-A-DAY WOMENS PO) Take by mouth.     omeprazole (PRILOSEC) 40 MG capsule Take 40 mg by mouth daily.     ondansetron (ZOFRAN ODT) 4 MG disintegrating tablet RIGHT AT ONSET OF MIGRAINE: Take 4mg  ondansetron with ubrelvy and then can repeat 4mg  with ubrelvy 2 hours later if the migraine does not go away. Otherwise can take  just for nausea 4-8mg  every 6-8 hours. 20 tablet 11   Potassium Chloride ER 20 MEQ TBCR Take 2 tablets by mouth 2 (two) times daily.     rosuvastatin (CRESTOR) 40 MG tablet Take 40 mg by mouth daily.     Ubrogepant (UBRELVY) 100 MG TABS Take 1 tablet (100 mg total) by mouth every 2 (two) hours as needed. Maximum 200mg  a day. 16 tablet 5   vitamin B-12 (CYANOCOBALAMIN) 1000 MCG tablet Take 1,000 mcg by mouth daily.     ALPRAZolam (XANAX) 0.25 MG tablet Take 2-3 tablets at bedtime 90 tablet 5   ARIPiprazole (ABILIFY) 2 MG tablet Take 1 tablet (2 mg total) by mouth daily. 90 tablet 1   busPIRone (BUSPAR) 30 MG tablet Take 1 tablet (30 mg total) by mouth 2 (two) times daily. 180 tablet 1   DULoxetine (CYMBALTA) 60 MG capsule Take 1 capsule (60 mg total) by mouth daily. 90 capsule 1   ibuprofen (ADVIL) 800 MG tablet Take by mouth as needed for cramping.     sertraline (ZOLOFT) 100 MG tablet Take 1 tablet (100 mg total) by mouth daily. 90 tablet 1   traZODone (DESYREL) 100 MG tablet Take 1/2-1 tablet po QHS prn insomnia 90 tablet 1   No current facility-administered medications for this visit.    Medication Side Effects: None  Allergies:  Allergies  Allergen Reactions   Penicillins Anaphylaxis and Shortness Of Breath    Did it involve swelling of the face/tongue/throat, SOB, or low BP? Yes Did it involve sudden or severe rash/hives, skin peeling, or any reaction on the inside of your mouth or nose? No Did you need to seek medical attention at a hospital or doctor's office? Yes When did it last happen?      30+ years If all above answers are "NO", may proceed with cephalosporin use.    Avelox [Moxifloxacin Hcl] Hives   Methylprednisolone     Blurry vision,eye pain    Levofloxacin Hives    Past Medical History:  Diagnosis Date   Anxiety    GERD (gastroesophageal reflux disease)    Headache    metoprolol and topamx   Hypertension    Pneumonia    x3 had vaccine    Past Medical  History, Surgical history, Social history, and Family history were reviewed and updated as appropriate.   Please see review of systems for further details on the patient's review from today.   Objective:   Physical Exam:  There were no vitals taken for this visit.  Physical Exam Constitutional:      General: She is not in acute distress. Musculoskeletal:        General: No deformity.  Neurological:     Mental Status: She is alert and oriented to person, place, and time.     Coordination: Coordination normal.  Psychiatric:        Attention and Perception: Attention and perception normal.  She does not perceive auditory or visual hallucinations.        Mood and Affect: Mood is not depressed. Affect is not labile, blunt, angry or inappropriate.        Speech: Speech normal.        Behavior: Behavior normal.        Thought Content: Thought content normal. Thought content is not paranoid or delusional. Thought content does not include homicidal or suicidal ideation. Thought content does not include homicidal or suicidal plan.        Cognition and Memory: Cognition and memory normal.        Judgment: Judgment normal.     Comments: Insight intact Mood is less anxious compared to past exams     Lab Review:     Component Value Date/Time   NA 136 04/20/2021 0848   K 2.8 (L) 04/20/2021 0848   CL 103 04/20/2021 0848   CO2 23 04/20/2021 0848   GLUCOSE 125 (H) 04/20/2021 0848   BUN 10 04/20/2021 0848   CREATININE 0.83 04/20/2021 0848   CALCIUM 9.1 04/20/2021 0848   PROT 7.5 08/21/2018 1503   ALBUMIN 4.4 08/21/2018 1503   AST 24 08/21/2018 1503   ALT 26 08/21/2018 1503   ALKPHOS 124 08/21/2018 1503   BILITOT 0.6 08/21/2018 1503   GFRNONAA >60 04/20/2021 0848   GFRAA >60 08/21/2018 1503       Component Value Date/Time   WBC 13.5 (H) 04/20/2021 0848   RBC 3.93 04/20/2021 0848   HGB 12.8 04/20/2021 0848   HCT 38.2 04/20/2021 0848   PLT 224 04/20/2021 0848   MCV 97.2 04/20/2021  0848   MCH 32.6 04/20/2021 0848   MCHC 33.5 04/20/2021 0848   RDW 13.2 04/20/2021 0848   LYMPHSABS 1.1 04/20/2021 0848   MONOABS 0.7 04/20/2021 0848   EOSABS 0.1 04/20/2021 0848   BASOSABS 0.1 04/20/2021 0848    No results found for: "POCLITH", "LITHIUM"   No results found for: "PHENYTOIN", "PHENOBARB", "VALPROATE", "CBMZ"   .res Assessment: Plan:   29 minutes spent dedicated to the care of this patient on the date of this encounter to include pre-visit review of records, ordering of medication, post visit documentation, and face-to-face time with the patient discussing response to medications and ongoing treatment plan. Discussed considering possible dose reduction in Cymbalta and agreed not reducing medication at this time with upcoming travel plans, holidays, and anniversary of losses. Will consider dose reduction of Cymbalta at next visit.  Continue Cymbalta 60 mg daily for depression and anxiety.  Continue Buspar 30 mg po BID for anxiety.  Continue Sertraline 100 mg daily for anxiety and depression.  Continue Trazodone 100 mg 1/2-1 tab po at bedtime prn insomnia.  Continue Alprazolam prn anxiety and insomnia.  Recommend continuing therapy with Stevphen Meuse, [facility].  Pt to follow-up in 6 months or sooner if clinically indicated.  Patient advised to contact office with any questions, adverse effects, or acute worsening in signs and symptoms.    Patricia Mclean was seen today for follow-up.  Diagnoses and all orders for this visit:  Recurrent major depressive disorder, in full remission (HCC) -     ARIPiprazole (ABILIFY) 2 MG tablet; Take 1 tablet (2 mg total) by mouth daily.  Generalized anxiety disorder -     busPIRone (BUSPAR) 30 MG tablet; Take 1 tablet (30 mg total) by mouth 2 (two) times daily. -     DULoxetine (CYMBALTA) 60 MG capsule; Take 1 capsule (60 mg total)  by mouth daily. -     sertraline (ZOLOFT) 100 MG tablet; Take 1 tablet (100 mg total) by mouth daily. -      ALPRAZolam (XANAX) 0.25 MG tablet; Take 2-3 tablets at bedtime  Insomnia, unspecified type -     traZODone (DESYREL) 100 MG tablet; Take 1/2-1 tablet po QHS prn insomnia     Please see After Visit Summary for patient specific instructions.  Future Appointments  Date Time Provider Department Center  04/04/2023 11:00 AM Stevphen Meuse, [facility] CP-CP None  05/02/2023  2:00 PM Stevphen Meuse, [facility] CP-CP None  06/19/2023  1:00 PM Stevphen Meuse, [facility] CP-CP None  06/24/2023  2:30 PM Butch Penny, NP [facility] None  09/12/2023  1:00 PM Corie Chiquito, PMHNP CP-CP None    No orders of the defined types were placed in this encounter.   -------------------------------

## 2023-04-04 ENCOUNTER — Ambulatory Visit (INDEPENDENT_AMBULATORY_CARE_PROVIDER_SITE_OTHER): Payer: BC Managed Care – PPO | Admitting: Psychiatry

## 2023-04-04 ENCOUNTER — Telehealth: Payer: Self-pay | Admitting: Psychiatry

## 2023-04-04 DIAGNOSIS — F411 Generalized anxiety disorder: Secondary | ICD-10-CM | POA: Diagnosis not present

## 2023-04-04 NOTE — Progress Notes (Signed)
[facility] Counselor/Therapist Progress Note  Patient ID: Patricia Mclean, MRN: [id number],    Date: 04/04/2023  Time Spent: 44 minutes start time 11:03 AM end time 11:47 AM Virtual Visit via video Note Connected with patient by a telemedicine/telehealth application, with their informed consent, and verified patient privacy and that I am speaking with the correct person using two identifiers. I discussed the limitations, risks, security and privacy concerns of performing psychotherapy and the availability of in person appointments. I also discussed with the patient that there may be a patient responsible charge related to this service. The patient expressed understanding and agreed to proceed. I discussed the treatment planning with the patient. The patient was provided an opportunity to ask questions and all were answered. The patient agreed with the plan and demonstrated an understanding of the instructions. The patient was advised to call  our office if  symptoms worsen or feel they are in a crisis state and need immediate contact.   Therapist Location: office Patient Location: home    Treatment Type: Individual Therapy  Reported Symptoms: anxiety, sadness, rumination  Mental Status Exam:  Appearance:   Well Groomed     Behavior:  Appropriate  Motor:  Normal  Speech/Language:   Normal Rate  Affect:  Appropriate  Mood:  anxious  Thought process:  normal  Thought content:    WNL  Sensory/Perceptual disturbances:    WNL  Orientation:  oriented to person, place, time/date, and situation  Attention:  Good  Concentration:  Good  Memory:  WNL  Fund of knowledge:   Good  Insight:    Good  Judgment:   Good  Impulse Control:  Good   Risk Assessment: Danger to Self:  No Self-injurious Behavior: No Danger to Others: No Duty to Warn:no Physical Aggression / Violence:No  Access to Firearms a concern: No  Gang Involvement:No   Subjective: Met with patient via virtual  session. She went on to share that she had a bad week.  She explained that her husband is out of town she is struggling with a migraine and she got a call back on her mammogram.  Patient stated she is having lots of anxiety over having to go and get a mammogram with contrast and ultrasound to see if she has potential breast cancer.  Patient is extremely anxious since her mother did die of breast cancer.  Discussed different options that she can talk with her physician about.  Reminded patient of her grounding exercises what she said had been very helpful at keeping her at a good place.  Had patient do a self spotting exercise in session.  Through the processing she was able to calm herself and reported feeling that it was helpful.  Patient was encouraged to use tools that she has found helpful over the past week as well as self spotting to get through her ultrasound and mammogram with contrast.  Interventions: Solution-Oriented/Positive Psychology, Insight-Oriented, and BSP  Diagnosis:   ICD-10-CM   1. Generalized anxiety disorder  F41.1       Plan:  Patient is to practice coping skills to decrease anxiety and panic.  Patient is to use self spotting to help decrease panic.  Patient is to process work traumas at future sessions.   Patient is to work on developing new relationships through walking with neighbors in the neighborhood volunteering, or going to a Bible study at church.  Patient is to continue allowing processing to continue over the next few  weeks.  Patient is to work on listening to theta music prior to bedtime to see if it improves sleep issues.  Patient is to work on exercising to release negative emotions appropriately.   Stevphen Meuse, [facility]

## 2023-04-04 NOTE — Telephone Encounter (Signed)
[name], basara are scheduled for a virtual visit with your provider today.    Just as we do with appointments in the office, we must obtain your consent to participate.  Your consent will be active for this visit and any virtual visit you may have with one of our providers in the next 365 days.    If you have a MyChart account, I can also send a copy of this consent to you electronically.  All virtual visits are billed to your insurance company just like a traditional visit in the office.  As this is a virtual visit, video technology does not allow for your provider to perform a traditional examination.  This may limit your provider's ability to fully assess your condition.  If your provider identifies any concerns that need to be evaluated in person or the need to arrange testing such as labs, EKG, etc, we will make arrangements to do so.    Although advances in technology are sophisticated, we cannot ensure that it will always work on either your end or our end.  If the connection with a video visit is poor, we may have to switch to a telephone visit.  With either a video or telephone visit, we are not always able to ensure that we have a secure connection.   I need to obtain your verbal consent now.   Are you willing to proceed with your visit today?   Patricia Mclean has provided verbal consent on 04/04/2023 for a virtual visit (video or telephone).   Stevphen Meuse, [facility] 04/04/2023  11:05 AM

## 2023-04-24 ENCOUNTER — Encounter: Payer: Self-pay | Admitting: Psychiatry

## 2023-05-02 ENCOUNTER — Ambulatory Visit: Payer: BC Managed Care – PPO | Admitting: Psychiatry

## 2023-05-02 DIAGNOSIS — F411 Generalized anxiety disorder: Secondary | ICD-10-CM

## 2023-05-02 NOTE — Progress Notes (Signed)
      [facility] Counselor/Therapist Progress Note  Patient ID: Patricia Mclean, MRN: [id number],    Date: 05/02/2023  Time Spent: 49 minutes start time 2:05 PM and time 2:54 PM  Treatment Type: Individual Therapy  Reported Symptoms: anxiety,triggered responses, rumination  Mental Status Exam:  Appearance:   Well Groomed     Behavior:  Appropriate  Motor:  Normal  Speech/Language:   Normal Rate  Affect:  Appropriate  Mood:  anxious  Thought process:  normal  Thought content:    WNL  Sensory/Perceptual disturbances:    WNL  Orientation:  oriented to person, place, time/date, and situation  Attention:  Good  Concentration:  Good  Memory:  WNL  Fund of knowledge:   Good  Insight:    Good  Judgment:   Good  Impulse Control:  Good   Risk Assessment: Danger to Self:  No Self-injurious Behavior: No Danger to Others: No Duty to Warn:no Physical Aggression / Violence:No  Access to Firearms a concern: No  Gang Involvement:No   Subjective: Patient was present for session. She shared that her mamgram results were positive which was good fro her.  She shared that she had gotten letter about Patricia Mclean PMHNP is leaving the practice.  Encouraged her to get on her appointment with her to discuss things. She went on to share that her liver number tripled and her doctor believed that it was due to a medication that she was put on for BP.  Patient went on to share that she was realizing her mother's diagnosis of cancer came right after she had a physical so she feels that that is probably very triggering for her.  Did processing set mom's diagnosis after physical, suds level 8, negative cognition "I should be healthier" felt guilt in her stomach and chest.  Patient was able to reduce suds level to 2 and recognize that she was doing what she needed to do to take care of herself.  Interventions: Eye Movement Desensitization and Reprocessing (EMDR) and Insight-Oriented  Diagnosis:    ICD-10-CM   1. Generalized anxiety disorder  F41.1       Plan: Patient is to practice coping skills to decrease anxiety and panic.  Patient is to use self spotting to help decrease panic.  Patient is to process work traumas at future sessions.   Patient is to work on developing new relationships through walking with neighbors in the neighborhood volunteering, or going to a Bible study at church.  Patient is to continue allowing processing to continue over the next few weeks.  Patient is to work on listening to theta music prior to bedtime to see if it improves sleep issues.  Patient is to work on exercising to release negative emotions appropriately.   Stevphen Meuse, [facility]

## 2023-05-30 ENCOUNTER — Encounter: Payer: Self-pay | Admitting: Psychiatry

## 2023-05-30 ENCOUNTER — Ambulatory Visit: Payer: BC Managed Care – PPO | Admitting: Psychiatry

## 2023-05-30 DIAGNOSIS — F411 Generalized anxiety disorder: Secondary | ICD-10-CM | POA: Diagnosis not present

## 2023-05-30 DIAGNOSIS — F3342 Major depressive disorder, recurrent, in full remission: Secondary | ICD-10-CM | POA: Diagnosis not present

## 2023-05-30 DIAGNOSIS — G4700 Insomnia, unspecified: Secondary | ICD-10-CM | POA: Diagnosis not present

## 2023-05-30 MED ORDER — DULOXETINE HCL 60 MG PO CPEP: 60.0000 mg | ORAL_CAPSULE | Freq: Every day | ORAL | 1 refills | Status: AC

## 2023-05-30 MED ORDER — SERTRALINE HCL 100 MG PO TABS: 100.0000 mg | ORAL_TABLET | Freq: Every day | ORAL | 1 refills | Status: AC

## 2023-05-30 MED ORDER — ARIPIPRAZOLE 2 MG PO TABS: 2.0000 mg | ORAL_TABLET | Freq: Every day | ORAL | 1 refills | Status: AC

## 2023-05-30 MED ORDER — TRAZODONE HCL 100 MG PO TABS: ORAL_TABLET | ORAL | 1 refills | Status: AC

## 2023-05-30 MED ORDER — BUSPIRONE HCL 30 MG PO TABS: 30.0000 mg | ORAL_TABLET | Freq: Two times a day (BID) | ORAL | 1 refills | Status: AC

## 2023-05-30 MED ORDER — ALPRAZOLAM 0.25 MG PO TABS: ORAL_TABLET | ORAL | 2 refills | Status: AC

## 2023-05-30 NOTE — Progress Notes (Signed)
TYREONA KOCHERSPERGER [id number] 10/04/1970 52 y.o.  Subjective:   Patient ID:  Patricia Mclean is a 52 y.o. (DOB [birth year]) female.  Chief Complaint:  Chief Complaint  Patient presents with   Follow-up    Anxiety and depression    HPI AMELY BREITBARTH presents to the office today for follow-up of anxiety and depression. She reports that she is getting ready to travel to [place] for the holidays. She reports that she is not having significant anxiety with travel. Rare panic attacks and is working through this with therapist. Denies depressed mood. Sleeping well with Trazodone.  Appetite has been ok  Energy and motivation are adequate. Denies concentration impairment. Denies SI.   Work has been going well. 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   Moved father to ALF in August. Father fell on Monday. Father has been diagnosed with Alzheimer's.   Past Psychiatric Medication Trials: Paxil-May have caused weight gain. Limited improvement. Took 20 mg.  Celexa- Denies significant improvement in mood or anxiety. Has taken for about a year.  Zoloft Cymbalta Topamax-Prescribed for migraines. No improvement on anxiety.  Alprazolam-Helpful for sleep initiation Gabapentin- Prescribed for post-operative nerve pain. Took briefly. Did not seem to improve anxiety.  Buspar- Ineffective Abilify-No improvement at 5 mg. Wt gain/unable to lose weight Rexulti   AIMS    Flowsheet Row Office Visit from 05/30/2023 in [facility] Office Visit from 03/14/2023 in [facility] Office Visit from 09[facility]12/2022 in [facility] Office Visit from 06/13/2022 in [facility] Office Visit from 04/23/2022 in [facility]  AIMS Total Score 0 0 0 0 0      GAD-7    Flowsheet Row Counselor from 11/23/2020 in [facility]  Total GAD-7 Score 14       PHQ2-9    Flowsheet Row Counselor from 11/23/2020 in [facility]  PHQ-2 Total Score 3  PHQ-9 Total Score 11      Flowsheet Row ED from 04/20/2021 in [facility] Emergency Department at [facility]  C-SSRS RISK CATEGORY No Risk        Review of Systems:  Review of Systems  Musculoskeletal:  Negative for gait problem.  Neurological:        Improved headaches with Ajovy  Psychiatric/Behavioral:         Please refer to HPI    Medications: I have reviewed the patient's current medications.  Current Outpatient Medications  Medication Sig Dispense Refill   ALPRAZolam (XANAX) 0.25 MG tablet Take 2-3 tablets at bedtime 90 tablet 5   [START ON 09/11/2023] ALPRAZolam (XANAX) 0.25 MG tablet Take 2-3 tablets at bedtime 90 tablet 2   chlorthalidone (HYGROTON) 25 MG tablet Take 12.5 mg by mouth every morning.     famotidine (PEPCID) 40 MG tablet Take by mouth.     fexofenadine (ALLEGRA) 180 MG tablet Take 180 mg by mouth as needed.     Fremanezumab-vfrm (AJOVY) 225 MG/1.5ML SOAJ Inject 225 mg into the skin every 30 (thirty) days. Pls run copay card: BIN [id number] [facility] [id number] ID [id number] expires 06/11/2023 1.5 mL 11   ibuprofen (ADVIL) 800 MG tablet Take by mouth as needed for cramping.     Methylcellulose, Laxative, (CITRUCEL) 500 MG TABS Take by mouth.     Multiple Vitamins-Minerals (ONE-A-DAY WOMENS PO) Take by mouth.     omeprazole (PRILOSEC) 40 MG capsule  Take 40 mg by mouth daily.     Potassium Chloride ER 20 MEQ TBCR Take 2 tablets by mouth 2 (two) times daily.     rosuvastatin (CRESTOR) 40 MG tablet Take 40 mg by mouth daily.     vitamin B-12 (CYANOCOBALAMIN) 1000 MCG tablet Take 1,000 mcg by mouth daily.     ARIPiprazole (ABILIFY) 2 MG tablet Take 1 tablet (2 mg total) by mouth daily. 90 tablet 1   busPIRone (BUSPAR) 30 MG tablet Take 1 tablet (30 mg total) by mouth 2 (two) times daily. 180 tablet 1   DULoxetine (CYMBALTA) 60 MG  capsule Take 1 capsule (60 mg total) by mouth daily. 90 capsule 1   ondansetron (ZOFRAN ODT) 4 MG disintegrating tablet RIGHT AT ONSET OF MIGRAINE: Take 4mg  ondansetron with ubrelvy and then can repeat 4mg  with ubrelvy 2 hours later if the migraine does not go away. Otherwise can take just for nausea 4-8mg  every 6-8 hours. 20 tablet 11   sertraline (ZOLOFT) 100 MG tablet Take 1 tablet (100 mg total) by mouth daily. 90 tablet 1   traZODone (DESYREL) 100 MG tablet Take 1/2-1 tablet po QHS prn insomnia 90 tablet 1   Ubrogepant (UBRELVY) 100 MG TABS Take 1 tablet (100 mg total) by mouth every 2 (two) hours as needed. Maximum 200mg  a day. 16 tablet 5   No current facility-administered medications for this visit.    Medication Side Effects: None  Allergies:  Allergies  Allergen Reactions   Penicillins Anaphylaxis and Shortness Of Breath    Did it involve swelling of the face/tongue/throat, SOB, or low BP? Yes Did it involve sudden or severe rash/hives, skin peeling, or any reaction on the inside of your mouth or nose? No Did you need to seek medical attention at a hospital or doctor's office? Yes When did it last happen?      30+ years If all above answers are "NO", may proceed with cephalosporin use.    Avelox [Moxifloxacin Hcl] Hives   Methylprednisolone     Blurry vision,eye pain    Levofloxacin Hives    Past Medical History:  Diagnosis Date   Anxiety    GERD (gastroesophageal reflux disease)    Headache    metoprolol and topamx   Hypertension    Pneumonia    x3 had vaccine    Past Medical History, Surgical history, Social history, and Family history were reviewed and updated as appropriate.   Please see review of systems for further details on the patient's review from today.   Objective:   Physical Exam:  There were no vitals taken for this visit.  Physical Exam Constitutional:      General: She is not in acute distress. Musculoskeletal:        General: No deformity.   Neurological:     Mental Status: She is alert and oriented to person, place, and time.     Coordination: Coordination normal.  Psychiatric:        Attention and Perception: Attention and perception normal. She does not perceive auditory or visual hallucinations.        Mood and Affect: Mood normal. Mood is not anxious or depressed. Affect is not labile, blunt, angry or inappropriate.        Speech: Speech normal.        Behavior: Behavior normal.        Thought Content: Thought content normal. Thought content is not paranoid or delusional. Thought content does not include homicidal or  suicidal ideation. Thought content does not include homicidal or suicidal plan.        Cognition and Memory: Cognition and memory normal.        Judgment: Judgment normal.     Comments: Insight intact     Lab Review:     Component Value Date/Time   NA 136 04/20/2021 0848   K 2.8 (L) 04/20/2021 0848   CL 103 04/20/2021 0848   CO2 23 04/20/2021 0848   GLUCOSE 125 (H) 04/20/2021 0848   BUN 10 04/20/2021 0848   CREATININE 0.83 04/20/2021 0848   CALCIUM 9.1 04/20/2021 0848   PROT 7.5 08/21/2018 1503   ALBUMIN 4.4 08/21/2018 1503   AST 24 08/21/2018 1503   ALT 26 08/21/2018 1503   ALKPHOS 124 08/21/2018 1503   BILITOT 0.6 08/21/2018 1503   GFRNONAA >60 04/20/2021 0848   GFRAA >60 08/21/2018 1503       Component Value Date/Time   WBC 13.5 (H) 04/20/2021 0848   RBC 3.93 04/20/2021 0848   HGB 12.8 04/20/2021 0848   HCT 38.2 04/20/2021 0848   PLT 224 04/20/2021 0848   MCV 97.2 04/20/2021 0848   MCH 32.6 04/20/2021 0848   MCHC 33.5 04/20/2021 0848   RDW 13.2 04/20/2021 0848   LYMPHSABS 1.1 04/20/2021 0848   MONOABS 0.7 04/20/2021 0848   EOSABS 0.1 04/20/2021 0848   BASOSABS 0.1 04/20/2021 0848    No results found for: "POCLITH", "LITHIUM"   No results found for: "PHENYTOIN", "PHENOBARB", "VALPROATE", "CBMZ"   .res Assessment: Plan:    28 minutes spent dedicated to the care of this  patient on the date of this encounter to include pre-visit review of records, ordering of medication, post visit documentation, and face-to-face time with the patient discussing plan for ongoing care since provider is leaving the practice. Discussed option to transfer care to another provider at [facility]. Pt reports that she prefers to continue care with this provider.  Continue Cymbalta 60 mg daily for anxiety and depression.  Continue Buspar 30 mg po BID for anxiety.  Continue Abilify 2 mg daily fair depression.  Continue Sertraline 100 mg daily for depression and anxiety.  Continue Trazodone 50-100 mg at bedtime as needed for insomnia.  Continue Alprazolam 0.25 mg 2-3 tablets at bedtime for insomnia.  Recommend continuing therapy with Stevphen Meuse, [facility].  Patient advised to contact office with any questions, adverse effects, or acute worsening in signs and symptoms.    Vidalia was seen today for follow-up.  Diagnoses and all orders for this visit:  Generalized anxiety disorder -     DULoxetine (CYMBALTA) 60 MG capsule; Take 1 capsule (60 mg total) by mouth daily. -     busPIRone (BUSPAR) 30 MG tablet; Take 1 tablet (30 mg total) by mouth 2 (two) times daily. -     sertraline (ZOLOFT) 100 MG tablet; Take 1 tablet (100 mg total) by mouth daily. -     ALPRAZolam (XANAX) 0.25 MG tablet; Take 2-3 tablets at bedtime  Recurrent major depressive disorder, in full remission (HCC) -     ARIPiprazole (ABILIFY) 2 MG tablet; Take 1 tablet (2 mg total) by mouth daily.  Insomnia, unspecified type -     traZODone (DESYREL) 100 MG tablet; Take 1/2-1 tablet po QHS prn insomnia -     ALPRAZolam (XANAX) 0.25 MG tablet; Take 2-3 tablets at bedtime     Please see After Visit Summary for patient specific instructions.  Future Appointments  Date Time Provider  Department Center  06/19/2023  1:00 PM Stevphen Meuse, [facility] None  06/24/2023  2:30 PM Butch Penny, NP [facility] None  07/18/2023  2:00  PM Stevphen Meuse, [facility] CP[facility] None  08/14/2023  1:00 PM Stevphen Meuse, [facility] None    No orders of the defined types were placed in this encounter.   -------------------------------

## 2023-06-19 ENCOUNTER — Ambulatory Visit: Payer: BC Managed Care – PPO | Admitting: Psychiatry

## 2023-06-19 DIAGNOSIS — F411 Generalized anxiety disorder: Secondary | ICD-10-CM | POA: Diagnosis not present

## 2023-06-19 NOTE — Progress Notes (Signed)
 [facility] Counselor/Therapist Progress Note  Patient ID: PRESLYN WARR, MRN: [id number],    Date: 06/19/2023  Time Spent: 49 minutes start time 1:10 PM end time 1:59  Treatment Type: Individual Therapy  Reported Symptoms: anxiety, triggered responses, panic, rumination  Mental Status Exam:  Appearance:   Well Groomed     Behavior:  Appropriate  Motor:  Normal  Speech/Language:   Normal Rate  Affect:  Appropriate  Mood:  anxious  Thought process:  normal  Thought content:    WNL  Sensory/Perceptual disturbances:    WNL  Orientation:  oriented to person, place, time/date, and situation  Attention:  Good  Concentration:  Good  Memory:  WNL  Fund of knowledge:   Good  Insight:    Good  Judgment:   Good  Impulse Control:  Good   Risk Assessment: Danger to Self:  No Self-injurious Behavior: No Danger to Others: No Duty to Warn:no Physical Aggression / Violence:No  Access to Firearms a concern: No  Gang Involvement:No   Subjective: Patient was present for session. She shared that she felt she is doing pretty good overall. 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 Her husband does not want her to take the position since when she was in it before they took advantage of her and it was very stressful.  Had patient think through what was most important to her regarding having a tidal or having flexibility to be able to do what she wants for her family as she needs to.  As patient processed the situation she was able to recognize at this time what is most important for her is her flexibility and to be able to be who she wants to be with her family.  Patient went on to share that the other issues she wanted to address in treatment was that she had lots of anxiety and panic over her dogs while they were in [place].  Patient was able to acknowledge the fact that flying to  [place] and flying to [place]  over the past few months where she progress for her.  Patient did processing set on not being able to sleep because her dogs were not with him, suds level 7, negative cognition I should not have left them felt anxiety in her stomach and jaw.  Patient was able to reduce those level to 2 and recognize that her dolls were fine and they would want her to go and have fun just like they had fun with their sitter.  Interventions: Solution-Oriented/Positive Psychology, Eye Movement Desensitization and Reprocessing (EMDR), and Insight-Oriented  Diagnosis:   ICD-10-CM   1. Generalized anxiety disorder  F41.1       Plan:  Patient is to practice coping skills to decrease anxiety and panic.  Patient is to use self spotting to help decrease panic.  Patient is to process work traumas at future sessions.   Patient is to work on developing new relationships through walking with neighbors in the neighborhood volunteering, or going to a Bible study at church.  Patient is to continue allowing processing to continue over the next few weeks.  Patient is to work on listening to theta music prior to bedtime to see if it improves sleep issues.  Patient is to work on exercising to release negative emotions appropriately.   Silvano Pacini, [facility]

## 2023-06-23 NOTE — Progress Notes (Signed)
 PATIENT: Patricia Mclean DOB: Sep 14, 1970  REASON FOR VISIT: follow up HISTORY FROM: patient PRIMARY NEUROLOGIST: Dr. Ines   Chief Complaint  Patient presents with   RM 19    Patient is here alone for migraine follow-up. She received a letter from [employer] [facility] with [facility] stating Ajovy  cannot be approved this year unless she tries Emgality , Aimovig, and Qulipta  first. She feels her migraines have been pretty good but right now she has a bad headache due to sinus issue. She thinks she has a sinus infection and would like an antibiotic.      HISTORY OF PRESENT ILLNESS: Today 06/23/23  Patricia Mclean is a 53 y.o. female who has been followed in this office for Migraine headaches . Returns today for follow-up. She remains on Ajovy  monthly injection. Takes ubrelvy  for abortive therapy and that works well.  She uses Zofran  for nausea.  She reports that Ajovy  has been working well however now her insurance is requiring her to try Emgality  Aimovig or Qulipta  first.  She has already tried Qulipta  and did not feel that it worked very well.  She does have issues with her blood pressure so I recommend we avoid Aimovig.  She is amenable to trying Emgality .  She also states that she thinks she has a sinus infection.  Try to get appoint with her PCP but they are booked.  Advised her today that she could do an e-visit.  HISTORY 12/11/2022: Patricia Mclean is a 53 y.o. female here as requested by Windy Coy, MD for migraines and daily headaches.We saw her in February and started her on Ajovy : once a month injection. In the meantime Bridged her with Qulipta (Atogepant ) for a month until we get Ajovy . Rayna was approved instead of nurtec due to insurance. We also discussed: Could consider Transmagnetic Stimulation ([place] [facility] ), Botox for migrianes - or in the masseters for her bruxism, discussed, Clenching - flexeril  at bedtime, consider botox in the masseters   She is thrilled,  down to 6 migraine days a month, milder as well, when she does get a migraine she takes ubrelvy  but she has not taken it immediately she has waited a few hours. Also don;t forget take immediately and then be sure to repeat in 2 hours if still have any even little bit of migraine. She was having > 15 migraines a month that were moderate to severe. She is thrilled. Would recommend taking ubrelvy  with tylenol  or alleve. She has a lot of nausea. Will give ondansetron  with the ubrelvy .  Methylprednisolone  gave her blurry vision and eye pain.    Patient complains of symptoms per HPI as well as the following symptoms: none . Pertinent negatives and positives per HPI. All others negative  REVIEW OF SYSTEMS: Out of a complete 14 system review of symptoms, the patient complains only of the following symptoms, and all other reviewed systems are negative.  ALLERGIES: Allergies  Allergen Reactions   Penicillins Anaphylaxis and Shortness Of Breath    Did it involve swelling of the face/tongue/throat, SOB, or low BP? Yes Did it involve sudden or severe rash/hives, skin peeling, or any reaction on the inside of your mouth or nose? No Did you need to seek medical attention at a hospital or doctor's office? Yes When did it last happen?      30+ years If all above answers are "NO", may proceed with cephalosporin use.    Avelox [Moxifloxacin Hcl] Hives   Methylprednisolone   Blurry vision,eye pain    Levofloxacin Hives    HOME MEDICATIONS: Outpatient Medications Prior to Visit  Medication Sig Dispense Refill   ALPRAZolam  (XANAX ) 0.25 MG tablet Take 2-3 tablets at bedtime 90 tablet 5   [START ON 09/11/2023] ALPRAZolam  (XANAX ) 0.25 MG tablet Take 2-3 tablets at bedtime 90 tablet 2   ARIPiprazole  (ABILIFY ) 2 MG tablet Take 1 tablet (2 mg total) by mouth daily. 90 tablet 1   busPIRone  (BUSPAR ) 30 MG tablet Take 1 tablet (30 mg total) by mouth 2 (two) times daily. 180 tablet 1   chlorthalidone (HYGROTON) 25 MG  tablet Take 12.5 mg by mouth every morning.     DULoxetine  (CYMBALTA ) 60 MG capsule Take 1 capsule (60 mg total) by mouth daily. 90 capsule 1   famotidine (PEPCID) 40 MG tablet Take by mouth.     fexofenadine (ALLEGRA) 180 MG tablet Take 180 mg by mouth as needed.     Fremanezumab -vfrm (AJOVY ) 225 MG/1.5ML SOAJ Inject 225 mg into the skin every 30 (thirty) days. Pls run copay card: BIN [id number] [facility] [id number] ID [id number] expires 06/11/2023 1.5 mL 11   ibuprofen (ADVIL) 800 MG tablet Take by mouth as needed for cramping.     Methylcellulose, Laxative, (CITRUCEL) 500 MG TABS Take by mouth.     Multiple Vitamins-Minerals (ONE-A-DAY WOMENS PO) Take by mouth.     omeprazole (PRILOSEC) 40 MG capsule Take 40 mg by mouth daily.     ondansetron  (ZOFRAN  ODT) 4 MG disintegrating tablet RIGHT AT ONSET OF MIGRAINE: Take 4mg  ondansetron  with ubrelvy  and then can repeat 4mg  with ubrelvy  2 hours later if the migraine does not go away. Otherwise can take just for nausea 4-8mg  every 6-8 hours. 20 tablet 11   Potassium Chloride  ER 20 MEQ TBCR Take 2 tablets by mouth 2 (two) times daily.     rosuvastatin (CRESTOR) 40 MG tablet Take 40 mg by mouth daily.     sertraline  (ZOLOFT ) 100 MG tablet Take 1 tablet (100 mg total) by mouth daily. 90 tablet 1   traZODone  (DESYREL ) 100 MG tablet Take 1/2-1 tablet po QHS prn insomnia 90 tablet 1   Ubrogepant  (UBRELVY ) 100 MG TABS Take 1 tablet (100 mg total) by mouth every 2 (two) hours as needed. Maximum 200mg  a day. 16 tablet 5   vitamin B-12 (CYANOCOBALAMIN) 1000 MCG tablet Take 1,000 mcg by mouth daily.     No facility-administered medications prior to visit.    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   Procedure: TOTAL HIP ARTHROPLASTY ANTERIOR APPROACH;  Surgeon: Yvone Rush, MD;  Location: [facility];  Service: Orthopedics;  Laterality: Left;    FAMILY HISTORY: Family History  Problem Relation Age of Onset   Breast cancer Mother    Ovarian cancer Mother    Stroke Mother    Migraines Mother    Dementia Father    Depression Brother    Anxiety disorder Brother    Migraines Paternal Grandmother     SOCIAL HISTORY: Social History   Socioeconomic History   Marital status: Married    Spouse name: Not on file   Number  of children: Not on file   Years of education: Not on file   Highest education level: Not on file  Occupational History   Not on file  Tobacco Use   Smoking status: Never   Smokeless tobacco: Never  Vaping Use   Vaping status: Never Used  Substance and Sexual Activity   Alcohol use: Yes    Alcohol/week: 1.0 standard drink of alcohol    Types: 1 Glasses of wine per week    Comment: occasional  wine with dinner   Drug use: Never   Sexual activity: Yes  Other Topics Concern   Not on file  Social History Narrative   Not on file   Social Drivers of Health   Financial Resource Strain: Low Risk  (08/22/2018)   Overall Financial Resource Strain (CARDIA)    Difficulty of Paying Living Expenses: Not very hard  Food Insecurity: No Food Insecurity (08/22/2018)   Hunger Vital Sign    Worried About Running Out of Food in the Last Year: Never true    Ran Out of Food in the Last Year: Never true  Transportation Needs: No Transportation Needs (08/22/2018)   PRAPARE - Administrator, Civil Service (Medical): No    Lack of Transportation (Non-Medical): No  Physical Activity: Unknown (08/22/2018)   Exercise Vital Sign    Days of Exercise per Week: 2 days    Minutes of Exercise per Session: Not on file  Stress: No Stress Concern Present (08/22/2018)   [employer] of Occupational [employer] - Occupational Stress  Questionnaire    Feeling of Stress : Only a little  Social Connections: Unknown (10/23/2021)   [facility] from [facility], [facility]   Social Network    Social Network: Not on file  Intimate Partner Violence: Unknown (09/14/2021)   [facility]   HITS    Physically Hurt: Not on file    Insult or Talk Down To: Not on file    Threaten Physical Harm: Not on file    Scream or Curse: Not on file      PHYSICAL EXAM  Vitals:   06/24/23 1436  BP: (!) 139/100  Pulse: (!) 107  Weight: 182 lb (82.6 kg)  Height: 5' 4 (1.626 m)   Body mass index is 31.24 kg/m.  Generalized: Well developed, in no acute distress   Neurological examination  Mentation: Alert oriented to time, place, history taking. Follows all commands speech and language fluent Cranial nerve II-XII: Pupils were equal round reactive to light. Extraocular movements were full, visual field were full on confrontational test. Facial sensation and strength were normal. Uvula tongue midline. Head turning and shoulder shrug  were normal and symmetric. Motor: The motor testing reveals 5 over 5 strength of all 4 extremities. Good symmetric motor tone is noted throughout.  Sensory: Sensory testing is intact to soft touch on all 4 extremities. No evidence of extinction is noted.  Coordination: Cerebellar testing reveals good finger-nose-finger and heel-to-shin bilaterally.  Gait and station: Gait is normal.  Reflexes: Deep tendon reflexes are symmetric and normal bilaterally.   DIAGNOSTIC DATA (LABS, IMAGING, TESTING) - I reviewed patient records, labs, notes, testing and imaging myself where available.  Lab Results  Component Value Date   WBC 13.5 (H) 04/20/2021   HGB 12.8 04/20/2021   HCT 38.2 04/20/2021   MCV 97.2 04/20/2021   PLT 224 04/20/2021      Component Value Date/Time   NA 136 04/20/2021 0848  K 2.8 (L) 04/20/2021 0848   CL 103 04/20/2021 0848   CO2 23 04/20/2021 0848    GLUCOSE 125 (H) 04/20/2021 0848   BUN 10 04/20/2021 0848   CREATININE 0.83 04/20/2021 0848   CALCIUM 9.1 04/20/2021 0848   PROT 7.5 08/21/2018 1503   ALBUMIN 4.4 08/21/2018 1503   AST 24 08/21/2018 1503   ALT 26 08/21/2018 1503   ALKPHOS 124 08/21/2018 1503   BILITOT 0.6 08/21/2018 1503   GFRNONAA >60 04/20/2021 0848   GFRAA >60 08/21/2018 1503      ASSESSMENT AND PLAN 53 y.o. year old female  has a past medical history of Anxiety, GERD (gastroesophageal reflux disease), Headache, Hypertension, and Pneumonia. here with:  Migraine headaches  - Stop Ajovy  - Start Emgality  120 mg monthly injection.  Take injection on February 1 when Ajovy  would have been due. - Continue Ubrelvy  100 mg for abortive therapy. Take 1 tab at the onset of migraine. Can repeat in 2 hours if needed. Only 2 tabs in 24 hours.  -Refilled Zofran  for nausea -Schedule an e-visit to discuss possible sinus infection -Follow-up in 1 year or sooner if needed     Duwaine Russell, MSN, NP-C 06/23/2023, 8:33 AM [facility] [address], [address] [place], [place] [address] [phone]

## 2023-06-24 ENCOUNTER — Encounter: Payer: Self-pay | Admitting: Adult Health

## 2023-06-24 ENCOUNTER — Ambulatory Visit: Payer: BC Managed Care – PPO | Admitting: Adult Health

## 2023-06-24 ENCOUNTER — Telehealth: Payer: BC Managed Care – PPO | Admitting: Family Medicine

## 2023-06-24 DIAGNOSIS — B9689 Other specified bacterial agents as the cause of diseases classified elsewhere: Secondary | ICD-10-CM

## 2023-06-24 DIAGNOSIS — G43709 Chronic migraine without aura, not intractable, without status migrainosus: Secondary | ICD-10-CM | POA: Diagnosis not present

## 2023-06-24 DIAGNOSIS — J019 Acute sinusitis, unspecified: Secondary | ICD-10-CM | POA: Diagnosis not present

## 2023-06-24 MED ORDER — EMGALITY 120 MG/ML ~~LOC~~ SOAJ: 120.0000 mg | SUBCUTANEOUS | 11 refills | Status: DC

## 2023-06-24 MED ORDER — ONDANSETRON 4 MG PO TBDP: ORAL_TABLET | ORAL | 11 refills | Status: AC

## 2023-06-24 MED ORDER — UBRELVY 100 MG PO TABS: 100.0000 mg | ORAL_TABLET | ORAL | 11 refills | Status: DC | PRN

## 2023-06-24 MED ORDER — AZITHROMYCIN 250 MG PO TABS: ORAL_TABLET | ORAL | 0 refills | Status: AC

## 2023-06-24 NOTE — Progress Notes (Signed)
 E-Visit for Sinus Problems  We are sorry that you are not feeling well.  Here is how we plan to help!  Based on what you have shared with me it looks like you have sinusitis.  Sinusitis is inflammation and infection in the sinus cavities of the head.  Based on your presentation I believe you most likely have Acute Bacterial Sinusitis.  This is an infection caused by bacteria and is treated with antibiotics. I have prescribed Z pack. You may use an oral decongestant such as Mucinex D or if you have glaucoma or high blood pressure use plain Mucinex. Saline nasal spray help and can safely be used as often as needed for congestion.  If you develop worsening sinus pain, fever or notice severe headache and vision changes, or if symptoms are not better after completion of antibiotic, please schedule an appointment with a health care provider.    Sinus infections are not as easily transmitted as other respiratory infection, however we still recommend that you avoid close contact with loved ones, especially the very young and elderly.  Remember to wash your hands thoroughly throughout the day as this is the number one way to prevent the spread of infection!  Home Care: Only take medications as instructed by your medical team. Complete the entire course of an antibiotic. Do not take these medications with alcohol. A steam or ultrasonic humidifier can help congestion.  You can place a towel over your head and breathe in the steam from hot water coming from a faucet. Avoid close contacts especially the very young and the elderly. Cover your mouth when you cough or sneeze. Always remember to wash your hands.  Get Help Right Away If: You develop worsening fever or sinus pain. You develop a severe head ache or visual changes. Your symptoms persist after you have completed your treatment plan.  Make sure you Understand these instructions. Will watch your condition. Will get help right away if you are not  doing well or get worse.  Thank you for choosing an e-visit.  Your e-visit answers were reviewed by a board certified advanced clinical practitioner to complete your personal care plan. Depending upon the condition, your plan could have included both over the counter or prescription medications.  Please review your pharmacy choice. Make sure the pharmacy is open so you can pick up prescription now. If there is a problem, you may contact your provider through [employer] and have the prescription routed to another pharmacy.  Your safety is important to [place]. If you have drug allergies check your prescription carefully.   For the next 24 hours you can use MyChart to ask questions about today's visit, request a non-urgent call back, or ask for a work or school excuse. You will get an email in the next two days asking about your experience. I hope that your e-visit has been valuable and will speed your recovery.  I provided 5 minutes of non face-to-face time during this encounter for chart review, medication and order placement, as well as and documentation.

## 2023-06-24 NOTE — Patient Instructions (Signed)
 Your Plan:  Stop Ajovy   Start Emgality  120 mg injection monthly- take 2/1.  Continue Ubrelvy  for abortive therapy and zofran  for nausea If your symptoms worsen or you develop new symptoms please let us  know.    Thank you for coming to see us  at [facility]. I hope we have been able to provide you high quality care today.  You may receive a patient satisfaction survey over the next few weeks. We would appreciate your feedback and comments so that we may continue to improve ourselves and the health of our patients.

## 2023-07-18 ENCOUNTER — Ambulatory Visit: Payer: BC Managed Care – PPO | Admitting: Psychiatry

## 2023-08-14 ENCOUNTER — Ambulatory Visit: Payer: BC Managed Care – PPO | Admitting: Psychiatry

## 2023-08-14 DIAGNOSIS — F411 Generalized anxiety disorder: Secondary | ICD-10-CM | POA: Diagnosis not present

## 2023-08-14 NOTE — Progress Notes (Signed)
      [facility] Counselor/Therapist Progress Note  Patient ID: MORGIN HALLS, MRN: [id number],    Date: 08/14/2023  Time Spent: 50 minutes start time 1:03 PM end time 1:53 PM  Treatment Type: Individual Therapy  Reported Symptoms: anxiety, sadness, triggered responses, rumination  Mental Status Exam:  Appearance:   Well Groomed     Behavior:  Appropriate  Motor:  Normal  Speech/Language:   Normal Rate  Affect:  Appropriate  Mood:  anxious and sad  Thought process:  normal  Thought content:    WNL  Sensory/Perceptual disturbances:    WNL  Orientation:  oriented to person, place, time/date, and situation  Attention:  Good  Concentration:  Good  Memory:  WNL  Fund of knowledge:   Good  Insight:    Good  Judgment:   Good  Impulse Control:  Good   Risk Assessment: Danger to Self:  No Self-injurious Behavior: No Danger to Others: No Duty to Warn:no Physical Aggression / Violence:No  Access to Firearms a concern: No  Gang Involvement:No   Subjective: Patient was present for session. She shared that her dad fell 5 times in a week and doctor said he had to move to memory care so that happened last week. She shared that her dad has been saying "just take me home to die" and it has been very hard with the transition. He has declined rapidly he isn't wanting to eat and has already lost lots of weight.  Had patient think through what she could do that would be most helpful in her situation.  Encouraged her to realize she has a weekend to try and make some plans and do some assessing for her dad.  She shared that her husband is power of attorney so they can find out information about his medical or get him to an urgent care for more of an assessment if needed.  Discussed importance of talking to her brother and seeing if her dad is physically at the end of his life or if he is just depressed and needs medication.  Patient was encouraged to consider what she would want him to know so  that she could tell him that while she is there.  Also encouraged her to realize that she has to focus on what she can do and knows that her dad has the right to make choices concerning his medical care.  Interventions: Solution-Oriented/Positive Psychology and Insight-Oriented  Diagnosis:   ICD-10-CM   1. Generalized anxiety disorder  F41.1       Plan: Patient is to practice coping skills to decrease anxiety and panic.  Patient is to follow plans from session to deal with the situation of her aging and potentially dying father.  Patient is to use self spotting to help decrease panic.  Patient is to process work traumas at future sessions.   Patient is to work on developing new relationships through walking with neighbors in the neighborhood volunteering, or going to a Bible study at church.  Patient is to continue allowing processing to continue over the next few weeks.  Patient is to work on listening to theta music prior to bedtime to see if it improves sleep issues.  Patient is to work on exercising to release negative emotions appropriately.   Stevphen Meuse, [facility]

## 2023-09-12 ENCOUNTER — Ambulatory Visit: Payer: BC Managed Care – PPO | Admitting: Psychiatry

## 2023-09-18 ENCOUNTER — Ambulatory Visit: Payer: BC Managed Care – PPO | Admitting: Psychiatry

## 2023-09-18 ENCOUNTER — Other Ambulatory Visit (HOSPITAL_COMMUNITY): Payer: Self-pay

## 2023-09-18 ENCOUNTER — Telehealth: Payer: Self-pay | Admitting: Pharmacist

## 2023-09-18 DIAGNOSIS — F411 Generalized anxiety disorder: Secondary | ICD-10-CM | POA: Diagnosis not present

## 2023-09-18 NOTE — Telephone Encounter (Signed)
 Pharmacy Patient Advocate Encounter  Received notification from [facility] that Prior Authorization for Ubrelvy 100MG  tablets has been APPROVED from 04.09.2025 to 04.09.2026   PA #/Case ID/Reference #: [id number]

## 2023-09-18 NOTE — Progress Notes (Unsigned)
 [facility] Counselor/Therapist Progress Note  Patient ID: NA WALDRIP, MRN: [id number],    Date: 09/18/2023  Time Spent: 50 minutes start time 2:08 PM end time 2:58 PM  Treatment Type: Individual Therapy  Reported Symptoms: anxiety, rumination, panic, triggered responses  Mental Status Exam:  Appearance:   Well Groomed     Behavior:  Appropriate  Motor:  Normal  Speech/Language:   Normal Rate  Affect:  Appropriate  Mood:  anxious  Thought process:  normal  Thought content:    WNL  Sensory/Perceptual disturbances:    WNL  Orientation:  oriented to person, place, time/date, and situation  Attention:  Good  Concentration:  Good  Memory:  WNL  Fund of knowledge:   Good  Insight:    Good  Judgment:   Good  Impulse Control:  Good   Risk Assessment: Danger to Self:  No Self-injurious Behavior: No Danger to Others: No Duty to Warn:no Physical Aggression / Violence:No  Access to Firearms a concern: No  Gang Involvement:No   Subjective: Patient was present for session. She shared that the visit with her father went well. She shared the memory care center has been good for him and she is feeling much better about the situation.  She shared that they are going back over Easter. She shared she is going to start coming off of her Abilify. Discussed importance of working on keeping positive things.  Patient stated that the thing that is upsetting her the most currently is her dog may have a tumor.  Patient stated that just hearing that possibility has thrown her into a spiral of research and seeing all the possibilities of what could happen which is leading to the panic.  Patient was encouraged to identify what she feels is behind that fear.  She shared that when her mother was dying everyone Telling her that she was fine and she was going to pull through even though they knew she was terminal.  She said because of that when her mother died she was not prepared and confused on  whether or not she could trust healthcare people or her family when it came to telling her things about medical issues.  Patient did processing set on being told mom was okay and then she died, suds level 9, negative cognition "I cannot trust anyone" as well as "I have to do everything myself" felt fear and anxiety in her stomach.  Patient was able to resolve the set and reduce suds level to 2.  She was able to recognize that she can handle difficult things and has done that.  Also she can trust herself and there have been many times when she has been able to trust the medical professionals and the outcome has matched what they said it would.  Patient was encouraged to continue journaling and working on that realization over the next few weeks.  Interventions: Eye Movement Desensitization and Reprocessing (EMDR) and Insight-Oriented  Diagnosis:   ICD-10-CM   1. Generalized anxiety disorder  F41.1       Plan:  Patient is to practice coping skills to decrease anxiety and panic.  Patient is to follow plans from session to deal with the situation regarding her puppy.  Patient is to use self spotting to help decrease panic.  Patient is to process work traumas at future sessions.   Patient is to work on developing new relationships through walking with neighbors in the neighborhood volunteering, or going to a  Bible study at church.  Patient is to continue allowing processing to continue over the next few weeks.  Patient is to work on listening to theta music prior to bedtime to see if it improves sleep issues.  Patient is to work on exercising to release negative emotions appropriately.   Stevphen Meuse, [facility]

## 2023-10-17 ENCOUNTER — Ambulatory Visit: Admitting: Psychiatry

## 2023-10-17 DIAGNOSIS — F411 Generalized anxiety disorder: Secondary | ICD-10-CM
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
## 2023-11-13 ENCOUNTER — Ambulatory Visit: Admitting: Psychiatry

## 2023-11-13 DIAGNOSIS — F411 Generalized anxiety disorder: Secondary | ICD-10-CM | POA: Diagnosis not present

## 2023-11-13 NOTE — Progress Notes (Signed)
      [facility] Counselor/Therapist Progress Note  Patient ID: Patricia Mclean, MRN: [id number],    Date: 11/13/2023  Time Spent: 44 minutes start time 12:04 PM and time 12:48 PM  Treatment Type: Individual Therapy  Reported Symptoms: anxiety, sadness, triggered responses, grief issues, nightmares, sleep issues, migraines  Mental Status Exam:  Appearance:   Well Groomed     Behavior:  Appropriate  Motor:  Normal  Speech/Language:   Normal Rate  Affect:  Appropriate and Tearful  Mood:  anxious and sad  Thought process:  normal  Thought content:    WNL  Sensory/Perceptual disturbances:    WNL  Orientation:  oriented to person, place, time/date, and situation  Attention:  Good  Concentration:  Good  Memory:  WNL  Fund of knowledge:   Good  Insight:    Good  Judgment:   Good  Impulse Control:  Good   Risk Assessment: Danger to Self:  No Self-injurious Behavior: No Danger to Others: No Duty to Warn:no Physical Aggression / Violence:No  Access to Firearms a concern: No  Gang Involvement:No   Subjective: Patient shared that she was still not doing well. She shared that since the suicide of her neighbor. She went on to explain that her neighbor was not left with information on how to proceed. She shared that her name was not on the mortgage or bank accounts because he did everything. She shared she is getting triggered by seeing the mail since everything is in his name.  Her husband had to go to [place] and that was hard.  Patient reported that the thing that was bothering her the most more her nightmares about her neighbor shooting himself.Did processing set on nightmare of him putting gun to his chin and head coming off, SUDS level 10 negative cognition "I am weak" felt sadness in head and stomach.  Patient was able to reduce those level to 4.  Through the processing she was able to realize they have a lot of really good memories together and she needs to focus more on the  positive memories than that one negative moment where he seemed to have lost his judgment.  Patient agreed to contact clinician if the nightmares do not subside.  Interventions: Eye Movement Desensitization and Reprocessing (EMDR) and Insight-Oriented  Diagnosis:   ICD-10-CM   1. Generalized anxiety disorder  F41.1       Plan: Patient is to practice coping skills to decrease anxiety and panic. Patient is to use self spotting to help decrease panic.  Patient is to focus on the positive memories of her neighbor rather than ruminating on the negative trauma.   Patient is to work on developing new relationships through walking with neighbors in the neighborhood volunteering, or going to a Bible study at church.  Patient is to continue allowing processing to continue over the next few weeks.  Patient is to work on listening to theta music prior to bedtime to see if it improves sleep issues.  Patient is to work on exercising to release negative emotions appropriately.   Patricia Mclean, [facility]

## 2023-11-26 ENCOUNTER — Encounter: Payer: Self-pay | Admitting: Adult Health

## 2023-11-27 ENCOUNTER — Ambulatory Visit: Admitting: Psychiatry

## 2023-11-27 DIAGNOSIS — F411 Generalized anxiety disorder: Secondary | ICD-10-CM | POA: Diagnosis not present

## 2023-11-27 NOTE — Progress Notes (Signed)
      [facility] Counselor/Therapist Progress Note  Patient ID: Patricia Mclean, MRN: [id number],    Date: 11/27/2023  Time Spent: 49 minutes start time 3:03 PM end time 3:52 PM  Treatment Type: Individual Therapy  Reported Symptoms: triggered responses, anxiety, grief, decrease in nightmares, rumination  Mental Status Exam:  Appearance:   Well Groomed     Behavior:  Appropriate  Motor:  Normal  Speech/Language:   Normal Rate  Affect:  Appropriate  Mood:  normal  Thought process:  normal  Thought content:    WNL  Sensory/Perceptual disturbances:    WNL  Orientation:  oriented to person, place, time/date, and situation  Attention:  Good  Concentration:  Good  Memory:  WNL  Fund of knowledge:   Good  Insight:    Good  Judgment:   Good  Impulse Control:  Good   Risk Assessment: Danger to Self:  No Self-injurious Behavior: No Danger to Others: No Duty to Warn:no Physical Aggression / Violence:No  Access to Firearms a concern: No  Gang Involvement:No   Subjective: Patient was present for session. She shared that she was doing much better. She shared that her tools had helped her. She shared that it is getting easier to help her neighbor without thinking about her husband. Her friend that moved to [place] was visiting and that has helped her mood. She is still working part time and that is going well. 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 Picture Finding guns in student's room who was prepared to kill people. SUDS level 10, negative cognition I responsible for everyone's safety felt anxiety in her chest. Patient was able to reduce SUDS level to 4.  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  Patient acknowledged that there was never a shooting while she was there which is a huge success.  Encouraged patient to remind herself of  the positives on a regular basis.  Interventions: Eye Movement Desensitization and Reprocessing (EMDR) and Insight-Oriented  Diagnosis:   ICD-10-CM   1. Generalized anxiety disorder  F41.1       Plan:  Patient is to practice coping skills to decrease anxiety and panic. Patient is to use self spotting to help decrease panic.  Patient is to focus on the positive memories of her neighbor rather than ruminating on the negative trauma.   Patient is to work on developing new relationships through walking with neighbors in the neighborhood volunteering, or going to a Bible study at church.  Patient is to continue allowing processing to continue over the next few weeks.  Patient is to work on listening to theta music prior to bedtime to see if it improves sleep issues.  Patient is to work on exercising to release negative emotions appropriately.   Patricia Mclean, [facility]

## 2023-11-28 ENCOUNTER — Telehealth: Payer: Self-pay

## 2023-11-28 ENCOUNTER — Other Ambulatory Visit (HOSPITAL_COMMUNITY): Payer: Self-pay

## 2023-12-02 ENCOUNTER — Other Ambulatory Visit: Payer: Self-pay | Admitting: Neurology

## 2023-12-02 MED ORDER — AJOVY 225 MG/1.5ML ~~LOC~~ SOAJ: 225.0000 mg | SUBCUTANEOUS | 5 refills | Status: DC

## 2023-12-02 NOTE — Telephone Encounter (Signed)
 See other encounter for newest update.

## 2023-12-02 NOTE — Addendum Note (Signed)
 Addended by: SHERRYL DUWAINE SQUIBB on: 12/02/2023 11:04 AM   Modules accepted: Orders

## 2023-12-02 NOTE — Addendum Note (Signed)
 Addended by: SHONA SAVANT A on: 12/02/2023 10:41 AM   Modules accepted: Orders

## 2023-12-02 NOTE — Telephone Encounter (Signed)
 I approved Ajovy  prescription.  Can we see when the last time she took Emgality  to make sure it spaced out before she takes Ajovy 

## 2023-12-11 ENCOUNTER — Ambulatory Visit: Admitting: Psychiatry

## 2023-12-11 DIAGNOSIS — F411 Generalized anxiety disorder: Secondary | ICD-10-CM

## 2023-12-11 NOTE — Progress Notes (Signed)
 [facility] Counselor/Therapist Progress Note  Patient ID: Patricia Mclean, MRN: [id number],    Date: 12/11/2023  Time Spent: 46 minutes start time 12:04 PM end time 12:50 PM Virtual Visit via Video Note Connected with patient by a telemedicine/telehealth application, with their informed consent, and verified patient privacy and that I am speaking with the correct person using two identifiers. I discussed the limitations, risks, security and privacy concerns of performing psychotherapy and the availability of in person appointments. I also discussed with the patient that there may be a patient responsible charge related to this service. The patient expressed understanding and agreed to proceed. I discussed the treatment planning with the patient. The patient was provided an opportunity to ask questions and all were answered. The patient agreed with the plan and demonstrated an understanding of the instructions. The patient was advised to call  our office if  symptoms worsen or feel they are in a crisis state and need immediate contact.   Therapist Location: office Patient Location: home    Treatment Type: Individual Therapy  Reported Symptoms: anxiety, panic, rumination, triggered response  Mental Status Exam:  Appearance:   Well Groomed     Behavior:  Appropriate  Motor:  Normal  Speech/Language:   Normal Rate  Affect:  Appropriate  Mood:  anxious  Thought process:  normal  Thought content:    WNL  Sensory/Perceptual disturbances:    WNL  Orientation:  oriented to person, place, time/date, and situation  Attention:  Good  Concentration:  Good  Memory:  WNL  Fund of knowledge:   Good  Insight:    Good  Judgment:   Good  Impulse Control:  Good   Risk Assessment: Danger to Self:  No Self-injurious Behavior: No Danger to Others: No Duty to Warn:no Physical Aggression / Violence:No  Access to Firearms a concern: No  Gang Involvement:No   Subjective: Met with  patient via virtual session. She shared that her sump pump went out so the plumbers are at her home. She shared they are going to see her Dad for the 4th and she is excited about that.  She shared she is wanting to figure out how to handle her anxiety when her husband gets stressed. He has been getting up at 3 in the morning and trying to make his deadlines. She shared he has been getting stressed and it caused her to have panic attack. She went on to share that he pulls away when he is stressed and so she tries to pursues him and he pulls away more. Things start to cycle and it sends her over the edge which makes it harder for him and than she feels guilty.  She was able to recognize that her brother did the same things when she was 9 and he was having to take care of her and he was only 29. Did processing set on having person withdraw that you rely on SUDS level 8, negative cognition I'm a burden and make things worse, felt anxiety guilt in stomach and chest.  Patient was able to reduce suds level to 3.  Through the processing she was able to recognize that she has to affirm the triggered part of her and remind herself of the truth.  Discussed different DBT skills to help her do that including ST OP and T IPP.  Discussed with patient trying brain spotting bilateral music and theta music as well to help calm her central nervous system.  Practiced  paced breathing exercises with patient to give her some options on things to try over the next few weeks especially while her husband is continuing to pursue the deadline with work.  Interventions: Dialectical Behavioral Therapy, Eye Movement Desensitization and Reprocessing (EMDR), and Insight-Oriented  Diagnosis:   ICD-10-CM   1. Generalized anxiety disorder  F41.1       Plan:  Patient is to practice coping skills to decrease anxiety and panic.  Patient is to try coping skills discussed in session and including DBT skill ST OP and T IPP.  Patient is to use  self spotting to help decrease panic.  Patient is to continue allowing processing to continue over the next few weeks.  Patient is to work on listening to theta music or bring spotting bilateral music to help with anxiety.  Patient is to work on exercising to release negative emotions appropriately.   Silvano Pacini, [facility]

## 2024-01-01 ENCOUNTER — Ambulatory Visit: Admitting: Psychiatry

## 2024-01-01 DIAGNOSIS — F411 Generalized anxiety disorder: Secondary | ICD-10-CM

## 2024-01-01 NOTE — Progress Notes (Signed)
 [facility] Counselor/Therapist Progress Note  Patient ID: Patricia Mclean, MRN: [id number],    Date: 01/01/2024  Time Spent: 41 minutes start time 2:01 PM end time 2:42 PM Virtual Visit via Video Note Connected with patient by a telemedicine/telehealth application, with their informed consent, and verified patient privacy and that I am speaking with the correct person using two identifiers. I discussed the limitations, risks, security and privacy concerns of performing psychotherapy and the availability of in person appointments. I also discussed with the patient that there may be a patient responsible charge related to this service. The patient expressed understanding and agreed to proceed. I discussed the treatment planning with the patient. The patient was provided an opportunity to ask questions and all were answered. The patient agreed with the plan and demonstrated an understanding of the instructions. The patient was advised to call  our office if  symptoms worsen or feel they are in a crisis state and need immediate contact.   Therapist Location: office Patient Location: home    Treatment Type: Individual Therapy  Reported Symptoms: Anxiety, sadness, triggered responses, migraine  Mental Status Exam:  Appearance:   Well Groomed     Behavior:  Appropriate  Motor:  Normal  Speech/Language:   Normal Rate  Affect:  Appropriate  Mood:  anxious  Thought process:  normal  Thought content:    WNL  Sensory/Perceptual disturbances:    Migraine  Orientation:  oriented to person, place, time/date, and situation  Attention:  Good  Concentration:  Good  Memory:  WNL  Fund of knowledge:   Good  Insight:    Good  Judgment:   Good  Impulse Control:  Good   Risk Assessment: Danger to Self:  No Self-injurious Behavior: No Danger to Others: No Duty to Warn:no Physical Aggression / Violence:No  Access to Firearms a concern: No  Gang Involvement:No   Subjective: Met with  patient via virtual session.  Patient explained she had a migraine and could not drive since she took her medication.  She shared that July 4th weekend. Her dad is doing well and that was good for patient. She went on to share that her friend lost her husband and had to get her leg amputated and was in a rehab facility and it was hard to see her there. The superman movie has triggered memories of her neighbor since he was supposed to see it with her husband. She shared that her brother broworred money from them a year ago and isn't paying them back at all.  Helped patient think through what was bothering her the most about the whole situation so she could communicate that with her brother.  Through the processing she was able to realize the issue is her brother is not doing what he said he was going to do and does not seem to be discussing the issue which is a big concern for her because that has not his normal behavior.  She also shared she is wondering if he is having a hard time adjusting to being the patriarch of the family since her dad is in the nursing home.  Discussed different ways that she could get the information on the situation from her brother and the importance of recognizing she has to address the issue since her seems to be this disconnect between relationships with things not being addressed.  Encouraged her to do it in a way of curiosity to try and figure out what is going  on with your brother rather than focusing on the money since the money is not as important to her as the relationship and figuring out that issues surrounding that.  Patient reported feeling positive about plan from session and agreed to communicate with her brother.  Interventions: Solution-Oriented/Positive Psychology and Humanistic/Existential  Diagnosis:   ICD-10-CM   1. Generalized anxiety disorder  F41.1       Plan:  Patient is to practice CBT, DBT, and coping skills to decrease anxiety and panic.  Patient is to  follow plans from session to communicate concerns with her brother in an appropriate manner.  Patient is to use self spotting to help decrease panic.  Patient is to continue allowing processing to continue over the next few weeks.  Patient is to work on listening to theta music or bring spotting bilateral music to help with anxiety.  Patient is to work on exercising to release negative emotions appropriately.   Silvano Pacini, [facility]

## 2024-01-29 ENCOUNTER — Ambulatory Visit: Admitting: Psychiatry

## 2024-02-03 ENCOUNTER — Ambulatory Visit: Admitting: Psychiatry

## 2024-02-03 DIAGNOSIS — F411 Generalized anxiety disorder: Secondary | ICD-10-CM | POA: Diagnosis not present

## 2024-02-03 NOTE — Progress Notes (Signed)
 [facility] Counselor/Therapist Progress Note  Patient ID: Patricia Mclean, MRN: [id number],    Date: 02/03/2024  Time Spent:  44 minutes start time 1:01 PM end time 1:45 PM Virtual Visit via Video Note Connected with patient by a telemedicine/telehealth application, with their informed consent, and verified patient privacy and that I am speaking with the correct person using two identifiers. I discussed the limitations, risks, security and privacy concerns of performing psychotherapy and the availability of in person appointments. I also discussed with the patient that there may be a patient responsible charge related to this service. The patient expressed understanding and agreed to proceed. I discussed the treatment planning with the patient. The patient was provided an opportunity to ask questions and all were answered. The patient agreed with the plan and demonstrated an understanding of the instructions. The patient was advised to call  our office if  symptoms worsen or feel they are in a crisis state and need immediate contact.   Therapist Location: home Patient Location: home    Treatment Type: Individual Therapy  Reported Symptoms: Anxiety, sadness, triggered responses, rumination  Mental Status Exam:  Appearance:   Well Groomed     Behavior:  Appropriate  Motor:  Normal  Speech/Language:   Normal Rate  Affect:  Appropriate  Mood:  anxious  Thought process:  normal  Thought content:    WNL  Sensory/Perceptual disturbances:    WNL  Orientation:  oriented to person, place, time/date, and situation  Attention:  Good  Concentration:  Good  Memory:  WNL  Fund of knowledge:   Good  Insight:    Good  Judgment:   Good  Impulse Control:  Good   Risk Assessment: Danger to Self:  No Self-injurious Behavior: No Danger to Others: No Duty to Warn:no Physical Aggression / Violence:No  Access to Firearms a concern: No  Gang Involvement:No   Subjective: Met with  patient via virtual session. She shared that she is back on the right medication for her migraines and that is helping her. She shared she wanted to discuss her work because overall it has been positive. Currently,  she is having to do a task at work that has been triggering for her. Due to the boss saying belittling things to others. Patient did processing set on boss saying negative comments I'm not sure why your husband would even marry you, SUDS level 9, negative cognition I am worthless felt hurt sadness in her stomach. She was able to reduce SUDS level to 4.  The processing patient was able to remember that her husband actually got a job with benefits so that she could quit working with that boss because he did love her and was glad that he married her.  Patient was encouraged to remind herself of that truth.  She was also encouraged to use different metaphors and cognitive reframes for her boss so as she is reading the negative comments that she does not internalize him but recognize that it is his issue and not hers.  Interventions: Eye Movement Desensitization and Reprocessing (EMDR) and Insight-Oriented  Diagnosis:   ICD-10-CM   1. Generalized anxiety disorder  F41.1       Plan:  Patient is to practice CBT, DBT, and coping skills to decrease anxiety and panic.  Patient is to follow plans from session to use cognitive reframes and self talk as she is reading the triggering emails.  Patient is to use self spotting to help decrease  panic.  Patient is to continue allowing processing to continue over the next few weeks.  Patient is to work on listening to theta music or bring spotting bilateral music to help with anxiety.  Patient is to work on exercising to release negative emotions appropriately.   Silvano Pacini, [facility]

## 2024-02-26 ENCOUNTER — Ambulatory Visit: Admitting: Psychiatry

## 2024-02-26 DIAGNOSIS — F411 Generalized anxiety disorder: Secondary | ICD-10-CM

## 2024-02-26 NOTE — Progress Notes (Unsigned)
 [facility] Counselor/Therapist Progress Note  Patient ID: NATALEA SUTLIFF, MRN: [id number],    Date: 02/26/2024  Time Spent: 42 minutes start time 12:02 PM end time 12:44 PM Virtual Visit via Video Note Connected with patient by a telemedicine/telehealth application, with their informed consent, and verified patient privacy and that I am speaking with the correct person using two identifiers. I discussed the limitations, risks, security and privacy concerns of performing psychotherapy and the availability of in person appointments. I also discussed with the patient that there may be a patient responsible charge related to this service. The patient expressed understanding and agreed to proceed. I discussed the treatment planning with the patient. The patient was provided an opportunity to ask questions and all were answered. The patient agreed with the plan and demonstrated an understanding of the instructions. The patient was advised to call  our office if  symptoms worsen or feel they are in a crisis state and need immediate contact.   Therapist Location: home Patient Location: home    Treatment Type: Individual Therapy  Reported Symptoms: anxiety, panic, triggered responses, fatigue, sleep issues  Mental Status Exam:  Appearance:   Casual     Behavior:  Appropriate  Motor:  Normal  Speech/Language:   Normal Rate  Affect:  Appropriate  Mood:  anxious  Thought process:  normal  Thought content:    WNL  Sensory/Perceptual disturbances:    WNL  Orientation:  oriented to person, place, time/date, and situation  Attention:  Good  Concentration:  Good  Memory:  WNL  Fund of knowledge:   Good  Insight:    Good  Judgment:   Good  Impulse Control:  Good   Risk Assessment: Danger to Self:  No Self-injurious Behavior: No Danger to Others: No Duty to Warn:no Physical Aggression / Violence:No  Access to Firearms a concern: No  Gang Involvement:No   Subjective: Patient  met for virtual session due to getting sick after visiting her family in [place]. She shared her work project's portion was completed and she is waiting to hear how it went. 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 She shared that it is triggering for her because of his negative behavior and the hurtful things that have happened in the past.  She explained that she feels panic about having to be trapped with him on a plane and does not think she would be able to get through the anxiety of the trip.Discussed how to manage the situation so she doesn't ride on the plane with him. She was also able to develop a visual to use of him being scar so she doesn't need to listen to him.Her dad is not eating and she is going to see him in October and he is very thin.  Patient was encouraged to realize she may need to start thinking through the situation and making plans for what may happen if he does not make it much longer.  Interventions: Cognitive Behavioral Therapy and Solution-Oriented/Positive Psychology  Diagnosis:   ICD-10-CM   1. Generalized anxiety disorder  F41.1       Plan: Patient is to practice CBT, DBT, and coping skills to decrease anxiety and panic.  Patient is to follow plans from session to manage the trip to the conference that she has to attend.  Patient is to use self spotting to help decrease panic.  Patient is to continue allowing processing to continue over the next few weeks.  Patient is to work on listening to theta music or bring spotting bilateral music to help with anxiety.  Patient is to work on exercising to release negative emotions appropriately.     Silvano Pacini, [facility]

## 2024-03-25 ENCOUNTER — Ambulatory Visit: Admitting: Psychiatry

## 2024-03-25 DIAGNOSIS — F411 Generalized anxiety disorder: Secondary | ICD-10-CM

## 2024-03-25 NOTE — Progress Notes (Unsigned)
 [facility] Counselor/Therapist Progress Note  Patient ID: Patricia Mclean, MRN: [id number],    Date: 03/25/2024  Time Spent: 50 minutes start time 1:07 PM end time 1:57 PM  Treatment Type: Individual Therapy  Reported Symptoms: anxiety, triggered responses, rumination  Mental Status Exam:  Appearance:   Well Groomed     Behavior:  Appropriate  Motor:  Normal  Speech/Language:   Normal Rate  Affect:  Appropriate  Mood:  anxious  Thought process:  normal  Thought content:    WNL  Sensory/Perceptual disturbances:    WNL  Orientation:  oriented to person, place, time/date, and situation  Attention:  Good  Concentration:  Good  Memory:  WNL  Fund of knowledge:   Good  Insight:    Good  Judgment:   Good  Impulse Control:  Good   Risk Assessment: Danger to Self:  No Self-injurious Behavior: No Danger to Others: No Duty to Warn:no Physical Aggression / Violence:No  Access to Firearms a concern: No  Gang Involvement:No   Subjective: Patient was present for session. She explained that her dad's wife took of on a trip and didn't tell them until the last minute and her dad was sent to the hospital. She went on to share that after a week he was able to leave but he had to be moved to a higher level of care. Had her stepmother sign over power of attorney and they found a company to take care of him and he will be moving to a new situation.  Patient explained the situation is still very difficult because until her stepmother signs that they can have power of attorney as well she could make quick changes at any time leaving her dad very unprotected and vulnerable.  Patient stated her brother is hoping to move him into the new facility soon and going to be trying to get the papers for her to sign power of attorney.  Discussed the fact that it will be very important that they encouraged her to realize this is to benefit her since she does not want to have to take care of their  father any longer.  Also discussed that it may be best for patient's husband who is the trust of their finances to talk with her about the situation because she trusts him and would listen to him better than her brother or patient considering the situation.  Patient stated she has lots of anxiety about making sure her dad is cared for in this difficult situation.  Patient was encouraged to remind herself that he made the decision to set things up the way that he did and that all she can do was the best she can in the situation.  Patient was also encouraged just to focus on the things she can control fix and change and making sure she does what she knows her dad needs.  Interventions: Solution-Oriented/Positive Psychology and Insight-Oriented  Diagnosis:   ICD-10-CM   1. Generalized anxiety disorder  F41.1       Plan:  Patient is to practice CBT, DBT, and coping skills to decrease anxiety and panic.  Patient is to follow plans from session to deal with the situation concerning her dad's condition and power of attorney.  Patient is to use self spotting to help decrease panic.  Patient is to continue allowing processing to continue over the next few weeks.  Patient is to work on listening to theta music or bring spotting bilateral music  to help with anxiety.  Patient is to work on exercising to release negative emotions appropriately.   Silvano Pacini, [facility]

## 2024-03-26 ENCOUNTER — Other Ambulatory Visit (HOSPITAL_COMMUNITY): Payer: Self-pay

## 2024-03-26 ENCOUNTER — Other Ambulatory Visit: Payer: Self-pay | Admitting: *Deleted

## 2024-03-26 ENCOUNTER — Telehealth: Payer: Self-pay | Admitting: *Deleted

## 2024-03-26 MED ORDER — AJOVY 225 MG/1.5ML ~~LOC~~ SOAJ: 225.0000 mg | SUBCUTANEOUS | 0 refills | Status: DC

## 2024-03-26 NOTE — Telephone Encounter (Signed)
 Pharmacy Patient Advocate Encounter   Received notification from Physician's Office that prior authorization for Ajovy  is required/requested.   Insurance verification completed.   The patient is insured through [facility].   Per test claim: PA required; PA started via CoverMyMeds. KEY [id number] . Waiting for clinical questions to populate.

## 2024-03-26 NOTE — Telephone Encounter (Signed)
 Clinical questions have been answered and PA submitted. PA currently Pending. Please be advised that most companies allow up to 30 days to make a decision. We will advise when a determination has been made, or follow up in 1 week.   Please reach out to our team, Rx Prior Auth Pool, if you haven't heard back in a week.

## 2024-03-26 NOTE — Telephone Encounter (Signed)
 SABRA

## 2024-03-26 NOTE — Telephone Encounter (Signed)
 Spoke to pharmacist at [facility],  pt tried to get ajovy  but the copay was high, so another card was put in, but now not work.  She tried calling the insurance and copay card [facility]) and was not able to process.  She needed a new prescription, so did that for one injection to see if would work, but also needs PA.  She said pt has new insurance gave the specifics but would tell the pt she needed this uploaded to [place] as well.   [facility] CM [id number] ID # [id number] BIN [id number] PCN ADV RX [phone]

## 2024-03-30 NOTE — Telephone Encounter (Signed)
 Pharmacy Patient Advocate Encounter  Received notification from [facility] that Prior Authorization for Ajovy  has been DENIED.  Full denial letter will be uploaded to the media tab. See denial reason below.   PA #/Case ID/Reference #: [id number]

## 2024-04-14 NOTE — Addendum Note (Signed)
 Addended by: SHONA SAVANT A on: 04/14/2024 01:59 PM   Modules accepted: Orders

## 2024-04-15 NOTE — Telephone Encounter (Signed)
 Patient has already tried Qulipta  in the past does she want to try this again?

## 2024-04-20 MED ORDER — QULIPTA 60 MG PO TABS: 60.0000 mg | ORAL_TABLET | Freq: Every day | ORAL | 12 refills | Status: AC

## 2024-04-20 NOTE — Addendum Note (Signed)
 Addended by: SHERRYL DUWAINE SQUIBB on: 04/20/2024 04:42 PM   Modules accepted: Orders

## 2024-04-30 ENCOUNTER — Ambulatory Visit: Admitting: Psychiatry

## 2024-05-26 ENCOUNTER — Ambulatory Visit: Admitting: Psychiatry

## 2024-05-26 DIAGNOSIS — F411 Generalized anxiety disorder: Secondary | ICD-10-CM

## 2024-05-26 NOTE — Progress Notes (Signed)
 [facility] Counselor/Therapist Progress Note  Patient ID: Patricia Mclean, MRN: [id number],    Date: 05/26/2024  Time Spent: 47 minutes start time 1:00 PM end time 1:47 PM Virtual Visit via Video Note Connected with patient by a telemedicine/telehealth application, with their informed consent, and verified patient privacy and that I am speaking with the correct person using two identifiers. I discussed the limitations, risks, security and privacy concerns of performing psychotherapy and the availability of in person appointments. I also discussed with the patient that there may be a patient responsible charge related to this service. The patient expressed understanding and agreed to proceed. I discussed the treatment planning with the patient. The patient was provided an opportunity to ask questions and all were answered. The patient agreed with the plan and demonstrated an understanding of the instructions. The patient was advised to call  our office if  symptoms worsen or feel they are in a crisis state and need immediate contact.   Therapist Location: home Patient Location: home    Treatment Type: Individual Therapy  Reported Symptoms: anxiety, sadness, triggered  responses, tearful  Mental Status Exam:  Appearance:   Casual     Behavior:  Appropriate  Motor:  Normal  Speech/Language:   Normal Rate  Affect:  Appropriate and Tearful  Mood:  anxious  Thought process:  normal  Thought content:    WNL  Sensory/Perceptual disturbances:    WNL  Orientation:  oriented to person, place, time/date, and situation  Attention:  Good  Concentration:  Good  Memory:  WNL  Fund of knowledge:   Good  Insight:    Good  Judgment:   Good  Impulse Control:  Good   Risk Assessment: Danger to Self:  No Self-injurious Behavior: No Danger to Others: No Duty to Warn:no Physical Aggression / Violence:No  Access to Firearms a concern: No  Gang Involvement:No   Subjective: Met with  patient via virtual session. She shared that the report for the accreditation and it was very good but the President still got very upset. He made her ride in the plane with them and had to sit in the back with his wife. She went on to share he yelled at them the whole time about how they are Losers... and it made her feel horrible about herself. She went on to share that they went to a dinner and he put them down the whole. Things are going on with her dad and he is declining and that is upsetting for her. Had her think through the facts of the situation. Did processing set on the trip due to her feeling worthless since that time. She shared the worst was the dinner when he told her that she was wrong when she asked for [place] water , and he told her it was Still, SUDS level 9, negative cognition I am not worthy of anything, felt hurt in stomach.  Patient was able to reduce suds level to 2 and realize that she is not the one with the issue but the president of the college has the issue.  Discussed importance of performing herself regularly and starting to think through what she would need to remind herself she continues to work with him.  Interventions: Eye Movement Desensitization and Reprocessing (EMDR) and Insight-Oriented  Diagnosis:   ICD-10-CM   1. Generalized anxiety disorder  F41.1       Plan:  Patient is to practice CBT, DBT, and coping skills to decrease anxiety  and panic.  Patient is to follow plans from session to deal with the college presidents comments.  Patient is to use self spotting to help decrease panic.  Patient is to continue allowing processing to continue over the next few weeks.  Patient is to work on listening to theta music or bring spotting bilateral music to help with anxiety.  Patient is to work on exercising to release negative emotions appropriately.   Silvano Pacini, [facility]

## 2024-06-24 ENCOUNTER — Other Ambulatory Visit: Payer: Self-pay | Admitting: Adult Health

## 2024-06-24 DIAGNOSIS — G43709 Chronic migraine without aura, not intractable, without status migrainosus: Secondary | ICD-10-CM

## 2024-06-29 ENCOUNTER — Encounter: Payer: Self-pay | Admitting: Psychiatry

## 2024-06-29 ENCOUNTER — Ambulatory Visit: Admitting: Psychiatry

## 2024-06-29 ENCOUNTER — Ambulatory Visit: Payer: BC Managed Care – PPO | Admitting: Adult Health

## 2024-06-29 DIAGNOSIS — F411 Generalized anxiety disorder: Secondary | ICD-10-CM | POA: Diagnosis not present

## 2024-06-29 NOTE — Progress Notes (Unsigned)
 "       [facility] Counselor/Therapist Progress Note  Patient ID: Patricia Mclean, MRN: [id number],    Date: 06/29/2024  Time Spent: 58 minutes start time 3:57 PM end time 4:55 PM Virtual Visit via Video Note Connected with patient by a telemedicine/telehealth application, with their informed consent, and verified patient privacy and that I am speaking with the correct person using two identifiers. I discussed the limitations, risks, security and privacy concerns of performing psychotherapy and the availability of in person appointments. I also discussed with the patient that there may be a patient responsible charge related to this service. The patient expressed understanding and agreed to proceed. I discussed the treatment planning with the patient. The patient was provided an opportunity to ask questions and all were answered. The patient agreed with the plan and demonstrated an understanding of the instructions. The patient was advised to call  our office if  symptoms worsen or feel they are in a crisis state and need immediate contact.   Therapist Location: home Patient Location: home    Treatment Type: Individual Therapy  Reported Symptoms: anxiety, sadness, grief issues, rumination, triggered responses, flashbacks, nightmares  Mental Status Exam:  Appearance:   Well Groomed     Behavior:  Appropriate  Motor:  Normal  Speech/Language:   Normal Rate  Affect:  Appropriate  Mood:  anxious and sad  Thought process:  normal  Thought content:    WNL  Sensory/Perceptual disturbances:    WNL  Orientation:  oriented to person, place, time/date, and situation  Attention:  Good  Concentration:  Good  Memory:  WNL  Fund of knowledge:   Good  Insight:    Good  Judgment:   Good  Impulse Control:  Good   Risk Assessment: Danger to Self:  No Self-injurious Behavior: No Danger to Others: No Duty to Warn:no Physical Aggression / Violence:No  Access to Firearms a concern: No  Gang  Involvement:No   Subjective: Met with patient via virtual session. She shared that her dad passed on 06/13/2024. She shared it has been hard. She shared that even though she knew it was coming it was still hard for her.  Patient also explained that she was out of the country visiting her husband's family for the holidays so that made it even more challenging because she did not get to say goodbye to her father.  Patient explained that her stepmother did not want to have the mass in [place]  where they were so her brother is planning the mass in [place] .  She went on to share that her stepmother told her brother that patient was going to be getting the ashes.  Patient stated that it was very disturbing and troubling to her because she was not sure what to do with the ashes since her dad's wishes were to stay with her stepmother.  Had patient think through the different pieces of plans that her father had made regarding what to do when he passed away.  She shared that he had thoughts of a plot with her mother and his family which is where they had planned on him being buried prior to him saying he wanted to stay with her stepmother.  Patient was encouraged to recognize that since his wishes to stay with her were not going to be honored that they needed to make plans for the other option that he had already paid for so obviously wanted.  Patient is to notify the grave site to see  if they can bury the ashes in the plot that has been bought for him after the memorial mass.  Patient went on to share that his death has brought up memories of her mother's death and some things that led up including her having tumors on her chest that would burst and her breast surgeries and the scars that that left.  In 1980s they did not do the reconstruction so her mother was very disfigured and that is very troubling to patient.  Encouraged patient to think through another visual of how her mom is now.  Patient stated that she felt she  was in heaven and had a new body patient was encouraged to think about the new body and replace that with the visual from what happened in the past if it surfaces.  Patient stated that the other very disturbing issue is her dog has a tumor in her mammary gland so that is very disturbing and triggering for her as well.  Patient was encouraged to think through the facts and to remember that things are very different now than they were in the 80s so there is new treatments higher survival rate and the potential that things even if it is cancerous being caught early and being removed could keep her dog safe.  Patient agreed to try and remind herself of that truth when the anxiety about her dog surfaces.  Patient stated that the other major issue is she has a big deadline on February 2 at work and she is having trouble focusing because of the trauma stuff.  Taught patient triangle breathing in session and encouraged her to practice it regularly also reminded her of other grounding exercises that she has learned in the past that she can utilize regularly to keep herself more focused and on task for what she has to accomplish currently.  Patient was encouraged to recognize it is a lot but trauma work may need to happen after the deadline since there is uncertainty of what may surface.  Agreed that if patient struggles more with emotions that she will contact office for an earlier appointment so things can be processed.  Interventions: Cognitive Behavioral Therapy, Solution-Oriented/Positive Psychology, and Insight-Oriented  Diagnosis:   ICD-10-CM   1. Generalized anxiety disorder  F41.1       Plan: Patient is to practice CBT, DBT, and coping skills to decrease anxiety and panic.  Patient is to follow plans from session to deal with issues that are surfacing after her dad's death and what she has to do to get through the July 13, 2024 deadline at work..  Patient is to use self spotting to help decrease panic.   Patient is to continue allowing processing to continue over the next few weeks.  Patient is to work on listening to theta music or bring spotting bilateral music to help with anxiety.  Patient is to work on exercising to release negative emotions appropriately.   Silvano Pacini, [facility]                   "

## 2024-06-30 ENCOUNTER — Encounter: Payer: Self-pay | Admitting: Adult Health

## 2024-06-30 ENCOUNTER — Telehealth: Payer: Self-pay | Admitting: Pharmacist

## 2024-06-30 ENCOUNTER — Other Ambulatory Visit (HOSPITAL_COMMUNITY): Payer: Self-pay

## 2024-06-30 ENCOUNTER — Ambulatory Visit: Payer: BC Managed Care – PPO | Admitting: Adult Health

## 2024-06-30 VITALS — BP 123/85 | HR 95 | Ht 64.0 in | Wt 179.0 lb

## 2024-06-30 DIAGNOSIS — G43711 Chronic migraine without aura, intractable, with status migrainosus: Secondary | ICD-10-CM | POA: Diagnosis not present

## 2024-06-30 MED ORDER — NURTEC 75 MG PO TBDP: ORAL_TABLET | ORAL | 11 refills | Status: AC

## 2024-06-30 MED ORDER — KETOROLAC TROMETHAMINE 30 MG/ML IJ SOLN: 30.0000 mg | Freq: Once | INTRAMUSCULAR | Status: AC

## 2024-06-30 MED ORDER — KETOROLAC TROMETHAMINE 60 MG/2ML IM SOLN
30.0000 mg | Freq: Once | INTRAMUSCULAR | Status: AC
  Administered 2024-06-30: 30 mg via INTRAMUSCULAR

## 2024-06-30 MED ORDER — AJOVY 225 MG/1.5ML ~~LOC~~ SOAJ: 225.0000 mg | SUBCUTANEOUS | 11 refills | Status: AC

## 2024-06-30 NOTE — Patient Instructions (Addendum)
 Your Plan:  Continue Qulipta  for now I will try to get Ajovy  approved Try Nurtec for abortive therapy Stop Ubrelvy  Toradol  today       Thank you for coming to see us  at [facility]. I hope we have been able to provide you high quality care today.  You may receive a patient satisfaction survey over the next few weeks. We would appreciate your feedback and comments so that we may continue to improve ourselves and the health of our patients.

## 2024-06-30 NOTE — Progress Notes (Signed)
 "   PATIENT: Patricia Mclean DOB: 06-Jul-1970  REASON FOR VISIT: follow up HISTORY FROM: patient   Chief Complaint  Patient presents with   Follow-up    Pt in 4 alone Pt here for migraines Pt states migraines are worse Pt states 8 migraines in last month      HISTORY OF PRESENT ILLNESS: Today 06/30/24:  Patricia Mclean is a 54 y.o. female with a history of migraine headaches. Returns today for follow-up.  She states her headaches have increased over the last several months.  She states that her father passed away from Alzheimer's disease at the beginning of the year she feels that this may have also contributed to her migraines.  She reports that she has a migraine today that is an 8 out of 10 on the pain scale.  It has lasted approximately 4 days.  Reports that she has deadlines to be at work and is concerned about the ongoing headache.  She is now on Qulipta .  In the past Ajovy  worked best but insurance would not approve it.  She has already tried Emgality .  I would not recommend imaging due to previous issues with her blood pressure.  Location: Temporal's that radiate across the forehead Frequency: 2-3 migraines a week Duration: Takes Ubrelvy  but does not always resolve the headache Aura: no Photophonia:yes  Phonophobia:no Nausea: no Vomiting: no Numbness: no Weakness: no Visual changes:no Gait and balance: no    Imaging: MRI 2017 normal.  Cardiac history: no   06/23/24 Patricia Mclean is a 54 y.o. female who has been followed in this office for Migraine headaches . Returns today for follow-up. She remains on Ajovy  monthly injection. Takes ubrelvy  for abortive therapy and that works well.  She uses Zofran  for nausea.  She reports that Ajovy  has been working well however now her insurance is requiring her to try Emgality  Aimovig or Qulipta  first.  She has already tried Qulipta  and did not feel that it worked very well.  She does have issues with her blood pressure  so I recommend we avoid Aimovig.  She is amenable to trying Emgality .  She also states that she thinks she has a sinus infection.  Try to get appoint with her PCP but they are booked.  Advised her today that she could do an e-visit.  HISTORY 12/11/2022: Patricia Mclean is a 54 y.o. female here as requested by Windy Coy, MD for migraines and daily headaches.We saw her in February and started her on Ajovy : once a month injection. In the meantime Bridged her with Qulipta (Atogepant ) for a month until we get Ajovy . Rayna was approved instead of nurtec due to insurance. We also discussed: Could consider Transmagnetic Stimulation ([facility] ), Botox for migrianes - or in the masseters for her bruxism, discussed, Clenching - flexeril  at bedtime, consider botox in the masseters   She is thrilled, down to 6 migraine days a month, milder as well, when she does get a migraine she takes ubrelvy  but she has not taken it immediately she has waited a few hours. Also don;t forget take immediately and then be sure to repeat in 2 hours if still have any even little bit of migraine. She was having > 15 migraines a month that were moderate to severe. She is thrilled. Would recommend taking ubrelvy  with tylenol  or alleve. She has a lot of nausea. Will give ondansetron  with the ubrelvy .  Methylprednisolone  gave her blurry vision and eye pain.  Patient complains of symptoms per HPI as well as the following symptoms: none . Pertinent negatives and positives per HPI. All others negative  REVIEW OF SYSTEMS: Out of a complete 14 system review of symptoms, the patient complains only of the following symptoms, and all other reviewed systems are negative.  ALLERGIES: Allergies  Allergen Reactions   Penicillins Anaphylaxis and Shortness Of Breath    Did it involve swelling of the face/tongue/throat, SOB, or low BP? Yes Did it involve sudden or severe rash/hives, skin peeling, or any reaction on the inside of  your mouth or nose? No Did you need to seek medical attention at a hospital or doctor's office? Yes When did it last happen?      30+ years If all above answers are NO, may proceed with cephalosporin use.    Avelox [Moxifloxacin Hcl] Hives   Methylprednisolone      Blurry vision,eye pain    Levofloxacin Hives    HOME MEDICATIONS: Outpatient Medications Prior to Visit  Medication Sig Dispense Refill   ALPRAZolam  (XANAX ) 0.25 MG tablet Take 2-3 tablets at bedtime 90 tablet 2   Atogepant  (QULIPTA ) 60 MG TABS Take 1 tablet (60 mg total) by mouth daily. 30 tablet 12   busPIRone  (BUSPAR ) 30 MG tablet Take 1 tablet (30 mg total) by mouth 2 (two) times daily. 180 tablet 1   chlorthalidone (HYGROTON) 25 MG tablet Take 12.5 mg by mouth every morning.     DULoxetine  (CYMBALTA ) 60 MG capsule Take 1 capsule (60 mg total) by mouth daily. 90 capsule 1   famotidine (PEPCID) 40 MG tablet Take by mouth.     fexofenadine (ALLEGRA) 180 MG tablet Take 180 mg by mouth as needed.     Methylcellulose, Laxative, (CITRUCEL) 500 MG TABS Take by mouth.     Multiple Vitamins-Minerals (ONE-A-DAY WOMENS PO) Take by mouth.     omeprazole (PRILOSEC) 40 MG capsule Take 40 mg by mouth daily.     ondansetron  (ZOFRAN  ODT) 4 MG disintegrating tablet RIGHT AT ONSET OF MIGRAINE: Take 4mg  ondansetron  with ubrelvy  and then can repeat 4mg  with ubrelvy  2 hours later if the migraine does not go away. Otherwise can take just for nausea 4-8mg  every 6-8 hours. 20 tablet 11   Potassium Chloride  ER 20 MEQ TBCR Take 2 tablets by mouth 2 (two) times daily.     rosuvastatin (CRESTOR) 40 MG tablet Take 40 mg by mouth daily.     sertraline  (ZOLOFT ) 100 MG tablet Take 1 tablet (100 mg total) by mouth daily. 90 tablet 1   traZODone  (DESYREL ) 100 MG tablet Take 1/2-1 tablet po QHS prn insomnia 90 tablet 1   UBRELVY  100 MG TABS TAKE ONE TABLET BY MOUTH EVERY TWO HOURS AS NEEDED (MAXIMUM 200MG  PER DAY) 16 tablet 0   vitamin B-12  (CYANOCOBALAMIN) 1000 MCG tablet Take 1,000 mcg by mouth daily.     ALPRAZolam  (XANAX ) 0.25 MG tablet Take 2-3 tablets at bedtime 90 tablet 5   ARIPiprazole  (ABILIFY ) 2 MG tablet Take 1 tablet (2 mg total) by mouth daily. 90 tablet 1   Fremanezumab -vfrm (AJOVY ) 225 MG/1.5ML SOAJ Inject 225 mg into the skin every 30 (thirty) days. 1.5 mL 5   Fremanezumab -vfrm (AJOVY ) 225 MG/1.5ML SOAJ Inject 225 mg into the skin every 30 (thirty) days. 1.68 mL 0   ibuprofen (ADVIL) 800 MG tablet Take by mouth as needed for cramping.     No facility-administered medications prior to visit.    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   Procedure: TOTAL HIP ARTHROPLASTY ANTERIOR APPROACH;  Surgeon: Yvone Rush, MD;  Location: [facility];  Service: Orthopedics;  Laterality: Left;    FAMILY HISTORY: Family History  Problem Relation Age of Onset   Breast cancer Mother    Ovarian cancer Mother    Stroke Mother    Migraines Mother    Dementia Father    Alzheimer's disease Father    Depression Brother    Anxiety disorder Brother    Migraines Paternal Grandmother     SOCIAL HISTORY: Social History   Socioeconomic History   Marital status: Married    Spouse name: Not on file   Number of children: Not on file   Years of education: Not on file   Highest education level: Not on file  Occupational History   Not on file  Tobacco Use   Smoking status: Never   Smokeless tobacco: Never  Vaping Use   Vaping status: Never Used  Substance and Sexual Activity   Alcohol use: Yes    Alcohol/week: 1.0 standard drink of alcohol    Types: 1 Glasses of wine per week    Comment: occasional  wine with dinner   Drug use:  Never   Sexual activity: Yes  Other Topics Concern   Not on file  Social History Narrative   Right handed   Caffeine: 0-1 cup/day   Pt lives with husband    Pt works    Social Drivers of Health   Tobacco Use: Low Risk (06/30/2024)   Patient History    Smoking Tobacco Use: Never    Smokeless Tobacco Use: Never    Passive Exposure: Not on file  Financial Resource Strain: Not on file  Food Insecurity: Not on file  Transportation Needs: Not on file  Physical Activity: Not on file  Stress: Not on file  Social Connections: Unknown (10/23/2021)   Received from [facility]   Social Network    Social Network: Not on file  Intimate Partner Violence: Unknown (09/14/2021)   Received from [facility]   HITS    Physically Hurt: Not on file    Insult or Talk Down To: Not on file    Threaten Physical Harm: Not on file    Scream or Curse: Not on file  Depression (PHQ2-9): Not on file  Alcohol Screen: Not on file  Housing: Not on file  Utilities: Not on file  Health Literacy: Not on file      PHYSICAL EXAM  Vitals:   06/30/24 1105  BP: 123/85  Pulse: 95  Weight: 179 lb (81.2 kg)  Height: 5' 4 (1.626 m)   Body mass index is 30.73 kg/m.  Generalized: Well developed, in no acute distress   Neurological examination  Mentation: Alert oriented to time, place, history taking. Follows all commands speech and language fluent Cranial nerve II-XII: Pupils were equal round reactive to light. Extraocular movements were full, visual field were full on confrontational test. Facial sensation and strength were normal. Uvula tongue midline. Head turning and shoulder shrug  were normal and symmetric. Motor: The motor testing reveals 5 over 5 strength of all 4  extremities. Good symmetric motor tone is noted throughout.  Sensory: Sensory testing is intact to soft touch on all 4 extremities. No evidence of extinction is noted.  Coordination: Cerebellar testing reveals good finger-nose-finger  and heel-to-shin bilaterally.  Gait and station: Gait is normal.  Reflexes: Deep tendon reflexes are symmetric and normal bilaterally.   DIAGNOSTIC DATA (LABS, IMAGING, TESTING) - I reviewed patient records, labs, notes, testing and imaging myself where available.  Lab Results  Component Value Date   WBC 13.5 (H) 04/20/2021   HGB 12.8 04/20/2021   HCT 38.2 04/20/2021   MCV 97.2 04/20/2021   PLT 224 04/20/2021      Component Value Date/Time   NA 136 04/20/2021 0848   K 2.8 (L) 04/20/2021 0848   CL 103 04/20/2021 0848   CO2 23 04/20/2021 0848   GLUCOSE 125 (H) 04/20/2021 0848   BUN 10 04/20/2021 0848   CREATININE 0.83 04/20/2021 0848   CALCIUM 9.1 04/20/2021 0848   PROT 7.5 08/21/2018 1503   ALBUMIN 4.4 08/21/2018 1503   AST 24 08/21/2018 1503   ALT 26 08/21/2018 1503   ALKPHOS 124 08/21/2018 1503   BILITOT 0.6 08/21/2018 1503   GFRNONAA >60 04/20/2021 0848   GFRAA >60 08/21/2018 1503      ASSESSMENT AND PLAN 54 y.o. year old female  has a past medical history of Anxiety, GERD (gastroesophageal reflux disease), Headache, Hypertension, and Pneumonia. here with:  Migraine headaches  - Continue Qulipta  for now - I will prescribe Ajovy  monthly injection-we will do an appeal of insurance denies - Try Nurtec 75 mg for abortive therapy.  Only 1 tablet in 24 hours. -Will do Toradol  30 mg IM injection today.  I have verified that the patient has no history of kidney disease.  No history of GI bleeds. - Follow-up in 1 year or sooner if needed     Duwaine Russell, MSN, NP-C 06/30/2024, 11:15 AM [facility] [address], [address] [place], [place] [address] [phone]   "

## 2024-06-30 NOTE — Progress Notes (Signed)
 Per Megan,NP please give patient Toradol  30mg . Wasted 30 mg witnessed by [employer]  Gave Pt Toradol  injection in left deltoid Placed band-aid on injection site . Pt waited for 10 minutes to make sure no reaction to medication Pt went to check out

## 2024-06-30 NOTE — Telephone Encounter (Signed)
 Pharmacy Patient Advocate Encounter   Received notification from [facility] that prior authorization for Nurtec 75MG  dispersible tablets is required/requested.   Insurance verification completed.   The patient is insured through [facility].   Per test claim: PA required; PA submitted to above mentioned insurance via Latent Key/confirmation #/EOC [id number] Status is pending

## 2024-07-01 NOTE — Telephone Encounter (Signed)
 Pharmacy Patient Advocate Encounter  Received notification from [facility] that Prior Authorization for Rimegepant Sulfate (NURTEC) 75 MG TBDP has been APPROVED from 07/01/2024 to 07/01/2025   PA #/Case ID/Reference #: [id number]

## 2024-07-13 ENCOUNTER — Encounter: Payer: Self-pay | Admitting: Adult Health

## 2024-07-30 ENCOUNTER — Ambulatory Visit: Admitting: Psychiatry

## 2024-09-02 ENCOUNTER — Ambulatory Visit: Admitting: Psychiatry

## 2024-09-30 ENCOUNTER — Ambulatory Visit: Admitting: Psychiatry

## 2024-10-28 ENCOUNTER — Ambulatory Visit: Admitting: Psychiatry

## 2025-06-30 ENCOUNTER — Ambulatory Visit: Admitting: Adult Health
